# Patient Record
Sex: Female | Born: 1955 | Race: White | Hispanic: No | Marital: Married | Smoking: Never smoker
Health system: Southern US, Community
[De-identification: ages and names within clinical notes are randomized; demographics above are authoritative.]

## PROBLEM LIST (undated history)

## (undated) DIAGNOSIS — Z9861 Coronary angioplasty status: Secondary | ICD-10-CM

## (undated) DIAGNOSIS — I2510 Atherosclerotic heart disease of native coronary artery without angina pectoris: Secondary | ICD-10-CM

## (undated) DIAGNOSIS — N6009 Solitary cyst of unspecified breast: Secondary | ICD-10-CM

## (undated) DIAGNOSIS — J309 Allergic rhinitis, unspecified: Secondary | ICD-10-CM

## (undated) DIAGNOSIS — E785 Hyperlipidemia, unspecified: Secondary | ICD-10-CM

## (undated) DIAGNOSIS — J45909 Unspecified asthma, uncomplicated: Secondary | ICD-10-CM

## (undated) DIAGNOSIS — I447 Left bundle-branch block, unspecified: Secondary | ICD-10-CM

## (undated) HISTORY — PX: BREAST SURGERY: SHX581

## (undated) HISTORY — DX: Allergic rhinitis, unspecified: J30.9

## (undated) HISTORY — PX: BREAST BIOPSY: SHX20

## (undated) HISTORY — DX: Left bundle-branch block, unspecified: I44.7

## (undated) HISTORY — DX: Hyperlipidemia, unspecified: E78.5

## (undated) HISTORY — DX: Solitary cyst of unspecified breast: N60.09

## (undated) HISTORY — DX: Unspecified asthma, uncomplicated: J45.909

---

## 1988-05-06 HISTORY — PX: AUGMENTATION MAMMAPLASTY: SUR837

## 1999-06-25 ENCOUNTER — Other Ambulatory Visit: Admission: RE | Admit: 1999-06-25 | Discharge: 1999-06-25 | Payer: Self-pay | Admitting: Family Medicine

## 2002-07-22 ENCOUNTER — Encounter: Payer: Self-pay | Admitting: Family Medicine

## 2002-07-22 ENCOUNTER — Encounter: Admission: RE | Admit: 2002-07-22 | Discharge: 2002-07-22 | Payer: Self-pay | Admitting: Family Medicine

## 2002-09-24 ENCOUNTER — Other Ambulatory Visit: Admission: RE | Admit: 2002-09-24 | Discharge: 2002-09-24 | Payer: Self-pay | Admitting: Family Medicine

## 2004-03-09 ENCOUNTER — Other Ambulatory Visit: Admission: RE | Admit: 2004-03-09 | Discharge: 2004-03-09 | Payer: Self-pay | Admitting: Family Medicine

## 2004-03-09 ENCOUNTER — Ambulatory Visit: Payer: Self-pay | Admitting: Family Medicine

## 2005-04-15 ENCOUNTER — Encounter: Admission: RE | Admit: 2005-04-15 | Discharge: 2005-04-15 | Payer: Self-pay | Admitting: Family Medicine

## 2005-06-19 ENCOUNTER — Ambulatory Visit: Payer: Self-pay | Admitting: Internal Medicine

## 2005-06-21 ENCOUNTER — Encounter: Admission: RE | Admit: 2005-06-21 | Discharge: 2005-06-21 | Payer: Self-pay | Admitting: Internal Medicine

## 2006-01-29 ENCOUNTER — Ambulatory Visit: Payer: Self-pay | Admitting: Family Medicine

## 2006-03-13 ENCOUNTER — Ambulatory Visit: Payer: Self-pay | Admitting: Family Medicine

## 2006-03-13 LAB — CONVERTED CEMR LAB
Albumin: 4.1 g/dL (ref 3.5–5.2)
BUN: 9 mg/dL (ref 6–23)
Basophils Absolute: 0.1 10*3/uL (ref 0.0–0.1)
Calcium: 9.1 mg/dL (ref 8.4–10.5)
Chloride: 105 meq/L (ref 96–112)
Creatinine, Ser: 0.7 mg/dL (ref 0.4–1.2)
Glomerular Filtration Rate, Af Am: 114 mL/min/{1.73_m2}
Glucose, Bld: 92 mg/dL (ref 70–99)
MCV: 100.5 fL — ABNORMAL HIGH (ref 78.0–100.0)
Monocytes Absolute: 0.8 10*3/uL — ABNORMAL HIGH (ref 0.2–0.7)
Monocytes Relative: 13.3 % — ABNORMAL HIGH (ref 3.0–11.0)
Neutrophils Relative %: 43.3 % (ref 43.0–77.0)
RBC: 4.07 M/uL (ref 3.87–5.11)
RDW: 12.2 % (ref 11.5–14.6)
Sodium: 140 meq/L (ref 135–145)
TSH: 1.39 microintl units/mL (ref 0.35–5.50)
Total Bilirubin: 1.3 mg/dL — ABNORMAL HIGH (ref 0.3–1.2)
Total Protein: 7.3 g/dL (ref 6.0–8.3)
Triglyceride fasting, serum: 67 mg/dL (ref 0–149)

## 2006-03-26 ENCOUNTER — Ambulatory Visit: Payer: Self-pay | Admitting: Family Medicine

## 2006-03-26 ENCOUNTER — Other Ambulatory Visit: Admission: RE | Admit: 2006-03-26 | Discharge: 2006-03-26 | Payer: Self-pay | Admitting: Family Medicine

## 2006-03-26 ENCOUNTER — Encounter: Payer: Self-pay | Admitting: Family Medicine

## 2006-04-22 ENCOUNTER — Encounter: Admission: RE | Admit: 2006-04-22 | Discharge: 2006-04-22 | Payer: Self-pay | Admitting: Family Medicine

## 2006-10-21 ENCOUNTER — Ambulatory Visit: Payer: Self-pay | Admitting: Family Medicine

## 2006-11-11 ENCOUNTER — Ambulatory Visit: Payer: Self-pay | Admitting: Family Medicine

## 2007-03-02 DIAGNOSIS — J309 Allergic rhinitis, unspecified: Secondary | ICD-10-CM

## 2007-03-02 HISTORY — DX: Allergic rhinitis, unspecified: J30.9

## 2007-03-25 ENCOUNTER — Ambulatory Visit: Payer: Self-pay | Admitting: Family Medicine

## 2007-03-25 DIAGNOSIS — J018 Other acute sinusitis: Secondary | ICD-10-CM | POA: Insufficient documentation

## 2007-05-08 ENCOUNTER — Encounter: Admission: RE | Admit: 2007-05-08 | Discharge: 2007-05-08 | Payer: Self-pay | Admitting: Family Medicine

## 2007-10-19 ENCOUNTER — Ambulatory Visit: Payer: Self-pay | Admitting: Family Medicine

## 2007-10-19 LAB — CONVERTED CEMR LAB
ALT: 17 units/L (ref 0–35)
AST: 23 units/L (ref 0–37)
Albumin: 4.1 g/dL (ref 3.5–5.2)
BUN: 11 mg/dL (ref 6–23)
CO2: 27 meq/L (ref 19–32)
Calcium: 8.7 mg/dL (ref 8.4–10.5)
Chloride: 103 meq/L (ref 96–112)
Cholesterol: 191 mg/dL (ref 0–200)
Creatinine, Ser: 0.7 mg/dL (ref 0.4–1.2)
Eosinophils Absolute: 0.5 10*3/uL (ref 0.0–0.7)
Glucose, Bld: 87 mg/dL (ref 70–99)
HCT: 39.7 % (ref 36.0–46.0)
Hemoglobin: 13.9 g/dL (ref 12.0–15.0)
MCHC: 34.9 g/dL (ref 30.0–36.0)
MCV: 102.7 fL — ABNORMAL HIGH (ref 78.0–100.0)
Monocytes Absolute: 0.6 10*3/uL (ref 0.1–1.0)
Monocytes Relative: 11.7 % (ref 3.0–12.0)
Platelets: 290 10*3/uL (ref 150–400)
RDW: 12.4 % (ref 11.5–14.6)
TSH: 1.64 microintl units/mL (ref 0.35–5.50)
Total Protein: 7.2 g/dL (ref 6.0–8.3)
Triglycerides: 102 mg/dL (ref 0–149)
VLDL: 20 mg/dL (ref 0–40)
WBC: 5.1 10*3/uL (ref 4.5–10.5)

## 2007-10-26 ENCOUNTER — Encounter: Payer: Self-pay | Admitting: Family Medicine

## 2007-10-26 ENCOUNTER — Other Ambulatory Visit: Admission: RE | Admit: 2007-10-26 | Discharge: 2007-10-26 | Payer: Self-pay | Admitting: Family Medicine

## 2007-10-26 ENCOUNTER — Ambulatory Visit: Payer: Self-pay | Admitting: Family Medicine

## 2007-11-30 ENCOUNTER — Ambulatory Visit: Payer: Self-pay | Admitting: Gastroenterology

## 2007-12-14 ENCOUNTER — Ambulatory Visit: Payer: Self-pay | Admitting: Gastroenterology

## 2007-12-14 LAB — HM COLONOSCOPY

## 2007-12-23 ENCOUNTER — Telehealth: Payer: Self-pay | Admitting: Gastroenterology

## 2008-04-13 DIAGNOSIS — J069 Acute upper respiratory infection, unspecified: Secondary | ICD-10-CM | POA: Insufficient documentation

## 2008-04-20 ENCOUNTER — Ambulatory Visit: Payer: Self-pay | Admitting: Family Medicine

## 2008-05-11 ENCOUNTER — Telehealth: Payer: Self-pay | Admitting: Family Medicine

## 2008-07-01 ENCOUNTER — Ambulatory Visit: Payer: Self-pay | Admitting: Family Medicine

## 2008-07-01 DIAGNOSIS — J189 Pneumonia, unspecified organism: Secondary | ICD-10-CM | POA: Insufficient documentation

## 2008-09-29 ENCOUNTER — Ambulatory Visit: Payer: Self-pay | Admitting: Family Medicine

## 2008-09-29 DIAGNOSIS — R1110 Vomiting, unspecified: Secondary | ICD-10-CM | POA: Insufficient documentation

## 2008-10-21 ENCOUNTER — Ambulatory Visit: Payer: Self-pay | Admitting: Family Medicine

## 2008-10-21 DIAGNOSIS — J45909 Unspecified asthma, uncomplicated: Secondary | ICD-10-CM | POA: Insufficient documentation

## 2008-10-21 HISTORY — DX: Unspecified asthma, uncomplicated: J45.909

## 2008-11-15 ENCOUNTER — Ambulatory Visit: Payer: Self-pay | Admitting: Family Medicine

## 2008-11-15 LAB — CONVERTED CEMR LAB
Albumin: 3.9 g/dL (ref 3.5–5.2)
BUN: 11 mg/dL (ref 6–23)
Basophils Relative: 0.9 % (ref 0.0–3.0)
CO2: 29 meq/L (ref 19–32)
Cholesterol: 206 mg/dL — ABNORMAL HIGH (ref 0–200)
Creatinine, Ser: 0.7 mg/dL (ref 0.4–1.2)
GFR calc non Af Amer: 93.2 mL/min (ref >60)
Glucose, Urine, Semiquant: NEGATIVE
Hemoglobin: 13.8 g/dL (ref 12.0–15.0)
Ketones, urine, test strip: NEGATIVE
Lymphocytes Relative: 27.7 % (ref 12.0–46.0)
Monocytes Relative: 12.2 % — ABNORMAL HIGH (ref 3.0–12.0)
Neutro Abs: 3.1 10*3/uL (ref 1.4–7.7)
Nitrite: NEGATIVE
Potassium: 5 meq/L (ref 3.5–5.1)
RBC: 3.84 M/uL — ABNORMAL LOW (ref 3.87–5.11)
Specific Gravity, Urine: 1.02
Total CHOL/HDL Ratio: 2
Total Protein: 7.3 g/dL (ref 6.0–8.3)
VLDL: 14 mg/dL (ref 0.0–40.0)
WBC: 5.7 10*3/uL (ref 4.5–10.5)
pH: 7

## 2008-11-21 ENCOUNTER — Other Ambulatory Visit: Admission: RE | Admit: 2008-11-21 | Discharge: 2008-11-21 | Payer: Self-pay | Admitting: Family Medicine

## 2008-11-21 ENCOUNTER — Ambulatory Visit: Payer: Self-pay | Admitting: Family Medicine

## 2008-11-21 ENCOUNTER — Encounter: Payer: Self-pay | Admitting: Family Medicine

## 2009-02-03 ENCOUNTER — Encounter: Admission: RE | Admit: 2009-02-03 | Discharge: 2009-02-03 | Payer: Self-pay | Admitting: Family Medicine

## 2009-07-21 ENCOUNTER — Ambulatory Visit: Payer: Self-pay | Admitting: Family Medicine

## 2009-11-08 ENCOUNTER — Telehealth: Payer: Self-pay | Admitting: Family Medicine

## 2009-11-17 ENCOUNTER — Ambulatory Visit: Payer: Self-pay | Admitting: Family Medicine

## 2009-11-17 LAB — CONVERTED CEMR LAB
Alkaline Phosphatase: 39 units/L (ref 39–117)
BUN: 14 mg/dL (ref 6–23)
Basophils Relative: 1 % (ref 0.0–3.0)
Bilirubin Urine: NEGATIVE
Bilirubin, Direct: 0.2 mg/dL (ref 0.0–0.3)
CO2: 27 meq/L (ref 19–32)
Cholesterol: 195 mg/dL (ref 0–200)
Eosinophils Relative: 4 % (ref 0.0–5.0)
GFR calc non Af Amer: 108.82 mL/min (ref >60)
Glucose, Bld: 84 mg/dL (ref 70–99)
HCT: 38.7 % (ref 36.0–46.0)
Hemoglobin: 13.6 g/dL (ref 12.0–15.0)
MCV: 102.2 fL — ABNORMAL HIGH (ref 78.0–100.0)
Monocytes Absolute: 0.7 10*3/uL (ref 0.1–1.0)
Nitrite: NEGATIVE
Platelets: 298 10*3/uL (ref 150.0–400.0)
Protein, U semiquant: NEGATIVE
RDW: 13 % (ref 11.5–14.6)
Sodium: 142 meq/L (ref 135–145)
TSH: 0.77 microintl units/mL (ref 0.35–5.50)
Total Bilirubin: 1.1 mg/dL (ref 0.3–1.2)
Total CHOL/HDL Ratio: 2
Triglycerides: 44 mg/dL (ref 0.0–149.0)
WBC: 6.5 10*3/uL (ref 4.5–10.5)

## 2009-11-23 ENCOUNTER — Telehealth: Payer: Self-pay | Admitting: Family Medicine

## 2009-11-23 ENCOUNTER — Ambulatory Visit: Payer: Self-pay | Admitting: Family Medicine

## 2009-11-23 DIAGNOSIS — N6009 Solitary cyst of unspecified breast: Secondary | ICD-10-CM | POA: Insufficient documentation

## 2009-11-23 HISTORY — DX: Solitary cyst of unspecified breast: N60.09

## 2009-11-30 ENCOUNTER — Encounter: Admission: RE | Admit: 2009-11-30 | Discharge: 2009-11-30 | Payer: Self-pay | Admitting: Family Medicine

## 2009-12-12 ENCOUNTER — Other Ambulatory Visit: Admission: RE | Admit: 2009-12-12 | Discharge: 2009-12-12 | Payer: Self-pay | Admitting: Family Medicine

## 2009-12-12 ENCOUNTER — Ambulatory Visit: Payer: Self-pay | Admitting: Internal Medicine

## 2009-12-12 ENCOUNTER — Ambulatory Visit: Payer: Self-pay | Admitting: Family Medicine

## 2010-05-08 ENCOUNTER — Encounter
Admission: RE | Admit: 2010-05-08 | Discharge: 2010-05-08 | Payer: Self-pay | Source: Home / Self Care | Attending: Family Medicine | Admitting: Family Medicine

## 2010-05-08 LAB — HM MAMMOGRAPHY

## 2010-05-28 ENCOUNTER — Encounter: Payer: Self-pay | Admitting: Orthopedic Surgery

## 2010-06-07 NOTE — Assessment & Plan Note (Signed)
Summary: CPX---WITH PAP//CCM   Vital Signs:  Patient profile:   55 year old female Menstrual status:  regular LMP:     11/21/2009 Height:      62 inches Weight:      138 pounds Temp:     97.5 degrees F oral BP sitting:   122 / 90  (left arm) Cuff size:   regular  Vitals Entered By: Kathrynn Speed CMA (November 23, 2009 2:03 PM) CC: cpx, w labs, NO PAP done due to [facility], src LMP (date): 11/21/2009     Enter LMP: 11/21/2009 Last PAP Result NEGATIVE FOR INTRAEPITHELIAL LESIONS OR MALIGNANCY.    CC:  cpx, w labs, NO PAP done due to menstration, and src.  History of Present Illness: Angelica [name] is a 55 year old, married female, nonsmoker, who comes in today for annual physical exam.  She has a history of underlying allergic rhinitis and occasional asthma.  She took Qvar in the spring for flare of asthma, currently asymptomatic.  She has routine eye care., no dental care, BSE monthly, mammogram, due, and limp.  He now will defer Pap tetanus 2007 seasonal flu 2010  Current Medications (verified): 1)  Adult Aspirin Low Strength 81 Mg  Tbdp (Aspirin) .Marland Kitchen.. 1 By Mouth Once Daily  Allergies (verified): 1)  ! Prednisone  Past History:  Past medical, surgical, family and social histories (including risk factors) reviewed, and no changes noted (except as noted below).  Past Medical History: Reviewed history from 10/26/2007 and no changes required. Allergic rhinitis bilateral breast implants  Past Surgical History: Reviewed history from 03/02/2007 and no changes required. Breast Bx Breast Implants  Family History: Reviewed history from 03/02/2007 and no changes required. Family History of Alcoholism/Addiction Family History Diabetes 1st degree relative Family History Hypertension Family History of Cardiovascular disorder  Social History: Reviewed history from 10/26/2007 and no changes required. Occupation:  Never Smoked Alcohol use-no Married Drug use-no Regular  exercise-yes  Review of Systems      See HPI  Physical Exam  General:  Well-developed,well-nourished,in no acute distress; alert,appropriate and cooperative throughout examination Head:  Normocephalic and atraumatic without obvious abnormalities. No apparent alopecia or balding. Eyes:  No corneal or conjunctival inflammation noted. EOMI. Perrla. Funduscopic exam benign, without hemorrhages, exudates or papilledema. Vision grossly normal. Ears:  External ear exam shows no significant lesions or deformities.  Otoscopic examination reveals clear canals, tympanic membranes are intact bilaterally without bulging, retraction, inflammation or discharge. Hearing is grossly normal bilaterally. Nose:  External nasal examination shows no deformity or inflammation. Nasal mucosa are pink and moist without lesions or exudates. Mouth:  Oral mucosa and oropharynx without lesions or exudates.  Teeth in good repair. Neck:  No deformities, masses, or tenderness noted. Chest Wall:  No deformities, masses, or tenderness noted. Breasts:  bilateral implants, question of a cystic lesion left breast 12 o'clock just below the nipple or is this part of the implant.  She will schedule her mammogram for next week Lungs:  Normal respiratory effort, chest expands symmetrically. Lungs are clear to auscultation, no crackles or wheezes. Heart:  Normal rate and regular rhythm. S1 and S2 normal without gallop, murmur, click, rub or other extra sounds. Abdomen:  Bowel sounds positive,abdomen soft and non-tender without masses, organomegaly or hernias noted. Rectal:  No external abnormalities noted. Normal sphincter tone. No rectal masses or tenderness. Genitalia:  Pelvic Exam:        External: normal female genitalia without lesions or masses  Vagina: normal without lesions or masses        Cervix: normal without lesions or masses        Adnexa: normal bimanual exam without masses or fullness        Uterus: normal by  palpation        Pap smear: performed Msk:  No deformity or scoliosis noted of thoracic or lumbar spine.   Pulses:  R and L carotid,radial,femoral,dorsalis pedis and posterior tibial pulses are full and equal bilaterally Extremities:  No clubbing, cyanosis, edema, or deformity noted with normal full range of motion of all joints.   Neurologic:  No cranial nerve deficits noted. Station and gait are normal. Plantar reflexes are down-going bilaterally. DTRs are symmetrical throughout. Sensory, motor and coordinative functions appear intact. Skin:  Intact without suspicious lesions or rashes Cervical Nodes:  No lymphadenopathy noted Axillary Nodes:  No palpable lymphadenopathy Inguinal Nodes:  No significant adenopathy Psych:  Cognition and judgment appear intact. Alert and cooperative with normal attention span and concentration. No apparent delusions, illusions, hallucinations   Impression & Recommendations:  Problem # 1:  ROUTINE GENERAL MEDICAL EXAM@HEALTH  CARE FACL (ICD-V70.0) Assessment Unchanged  Orders: Prescription Created Electronically [phone])  Complete Medication List: 1)  Adult Aspirin Low Strength 81 Mg Tbdp (Aspirin) .Marland Kitchen.. 1 by mouth once daily  Other Orders: T-Bone Densitometry [phone])  Patient Instructions: 1)  return in two weeks for your Pap smear 2)  It is important that you exercise regularly at least 20 minutes 5 times a week. If you develop chest pain, have severe difficulty breathing, or feel very tired , stop exercising immediately and seek medical attention. 3)  Schedule your mammogram. 4)  Schedule a colonoscopy/sigmoidoscopy to help detect colon cancer. 5)  Take calcium +Vitamin D daily. 6)  Take an Aspirin every day.

## 2010-06-07 NOTE — Progress Notes (Signed)
Summary: CA-125  Phone Note Call from Patient Call back at Home Phone [phone]   Caller: Patient Call For: Roderick Pee MD Summary of Call: Pt is sch for cpx labs on 11-16-2009 pt would like to add CA-125 lab test to order. Can I sch? Initial call taken by: Heron Sabins,  November 08, 2009 12:03 PM  Follow-up for Phone Call        discussed with Kristin the U981 is not a screening test Follow-up by: Roderick Pee MD,  November 08, 2009 12:12 PM

## 2010-06-07 NOTE — Assessment & Plan Note (Signed)
Summary: SINUSITIS, FEVER // RS   Vital Signs:  Patient profile:   55 year old female Menstrual status:  regular Weight:      138 pounds BMI:     25.33 Temp:     98.8 degrees F oral BP sitting:   120 / 80  (left arm) Cuff size:   regular  Vitals Entered By: Kern Reap CMA Duncan Dull) (July 21, 2009 3:33 PM)  Reason for Visit cough,congestion, fever  History of Present Illness: Angelica [name] is a 55 year old female, who comes in today for evaluation of a fever, chills, sore throat, nonproductive cough x 4 days.  She said these flulike symptoms for the past 4 days.  Today, the fever it is decreasing.  She did not get her flu shot last fall.  No nausea, vomiting, or diarrhea.  No wheezing.  Allergies: 1)  ! Prednisone  Past History:  Past medical, surgical, family and social histories (including risk factors) reviewed for relevance to current acute and chronic problems.  Past Medical History: Reviewed history from 10/26/2007 and no changes required. Allergic rhinitis bilateral breast implants  Past Surgical History: Reviewed history from 03/02/2007 and no changes required. Breast Bx Breast Implants  Family History: Reviewed history from 03/02/2007 and no changes required. Family History of Alcoholism/Addiction Family History Diabetes 1st degree relative Family History Hypertension Family History of Cardiovascular disorder  Social History: Reviewed history from 10/26/2007 and no changes required. Occupation:  Never Smoked Alcohol use-no Married Drug use-no Regular exercise-yes  Review of Systems      See HPI  Physical Exam  General:  Well-developed,well-nourished,in no acute distress; alert,appropriate and cooperative throughout examination Head:  Normocephalic and atraumatic without obvious abnormalities. No apparent alopecia or balding. Eyes:  No corneal or conjunctival inflammation noted. EOMI. Perrla. Funduscopic exam benign, without hemorrhages, exudates or  papilledema. Vision grossly normal. Ears:  External ear exam shows no significant lesions or deformities.  Otoscopic examination reveals clear canals, tympanic membranes are intact bilaterally without bulging, retraction, inflammation or discharge. Hearing is grossly normal bilaterally. Nose:  External nasal examination shows no deformity or inflammation. Nasal mucosa are pink and moist without lesions or exudates. Mouth:  Oral mucosa and oropharynx without lesions or exudates.  Teeth in good repair. Neck:  No deformities, masses, or tenderness noted. Chest Wall:  No deformities, masses, or tenderness noted. Lungs:  Normal respiratory effort, chest expands symmetrically. Lungs are clear to auscultation, no crackles or wheezes.   Impression & Recommendations:  Problem # 1:  VIRAL URI (ICD-465.9) Assessment Deteriorated  Her updated medication list for this problem includes:    Adult Aspirin Low Strength 81 Mg Tbdp (Aspirin) .Marland Kitchen... 1 by mouth once daily  Complete Medication List: 1)  Adult Aspirin Low Strength 81 Mg Tbdp (Aspirin) .Marland Kitchen.. 1 by mouth once daily 2)  Qvar 40 Mcg/act Aers (Beclomethasone dipropionate) .... 2 ps two times a day 3)  Biaxin 500 Mg Tabs (Clarithromycin) .... Take 1 tablet by mouth two times a day  Patient Instructions: 1)  drink 30 ounces of water daily, run a  vaporizer in her bedroom at night,  Hydromet one half to 1 teaspoon 3 times a day as needed for cough. 2)  I would restart the Qvar two puffs b.i.d. and I'll give you a prescription for Biaxin 500 mg b.i.d. to take as needed 3)  returned the second week in July for your annual exam Prescriptions: QVAR 40 MCG/ACT AERS (BECLOMETHASONE DIPROPIONATE) 2 ps two times a day  #2 x  4   Entered and Authorized by:   Roderick Pee MD   Signed by:   Roderick Pee MD on 07/21/2009   Method used:   Print then Give to Patient   RxID:   [id number] BIAXIN 500 MG TABS (CLARITHROMYCIN) Take 1 tablet by mouth two times a  day  #20 x 0   Entered and Authorized by:   Roderick Pee MD   Signed by:   Roderick Pee MD on 07/21/2009   Method used:   Print then Give to Patient   RxID:   [id number]

## 2010-06-07 NOTE — Progress Notes (Signed)
Summary: written order  Phone Note Call from Patient Call back at Home Phone [phone]   Caller: Patient Call For: Roderick Pee MD Summary of Call: Pt needs order for diagnostic mammogram left  breast ?mass or cyst fax to [facility] [phone] Initial call taken by: Heron Sabins,  November 23, 2009 4:07 PM  Follow-up for Phone Call        ok Follow-up by: Roderick Pee MD,  November 23, 2009 4:18 PM  Additional Follow-up for Phone Call Additional follow up Details #1::        order was faxed on 7-25-*2011 Additional Follow-up by: Heron Sabins,  November 27, 2009 9:33 AM  New Problems: BREAST CYST, LEFT (ICD-610.0)   New Problems: BREAST CYST, LEFT (ICD-610.0)

## 2010-06-07 NOTE — Miscellaneous (Signed)
Summary: BONE DENSITY  Clinical Lists Changes  Orders: Added new Test order of T-Bone Densitometry (77080) - Signed Added new Test order of T-Lumbar Vertebral Assessment (77082) - Signed 

## 2010-06-07 NOTE — Assessment & Plan Note (Signed)
Summary: pap no charge per pt/original cpx 11-23-2009/njr----PT [facility] // RS   History of Present Illness: Angelica [name] is a 55 year old female, who comes in today for a Pap smear.  We did her physical examination a week ago, but were unable to her Pap.  Tissue is having her menstrual cycle at that time.  Allergies: 1)  ! Prednisone  Physical Exam  General:  Well-developed,well-nourished,in no acute distress; alert,appropriate and cooperative throughout examination Rectal:  No external abnormalities noted. Normal sphincter tone. No rectal masses or tenderness. Genitalia:  Pelvic Exam:        External: normal female genitalia without lesions or masses        Vagina: normal without lesions or masses        Cervix: normal without lesions or masses        Adnexa: normal bimanual exam without masses or fullness        Uterus: normal by palpation        Pap smear: performed   Impression & Recommendations:  Problem # 1:  ROUTINE GENERAL MEDICAL EXAM@HEALTH  CARE FACL (ICD-V70.0) Assessment Comment Only  Orders: No Charge Patient Arrived (NCPA0) (NCPA0)  Complete Medication List: 1)  Adult Aspirin Low Strength 81 Mg Tbdp (Aspirin) .Marland Kitchen.. 1 by mouth once daily

## 2010-10-16 ENCOUNTER — Ambulatory Visit (INDEPENDENT_AMBULATORY_CARE_PROVIDER_SITE_OTHER): Payer: Self-pay | Admitting: Internal Medicine

## 2010-10-16 ENCOUNTER — Encounter: Payer: Self-pay | Admitting: Internal Medicine

## 2010-10-16 VITALS — BP 120/88 | Ht 64.0 in | Wt 142.0 lb

## 2010-10-16 DIAGNOSIS — M778 Other enthesopathies, not elsewhere classified: Secondary | ICD-10-CM

## 2010-10-16 NOTE — Patient Instructions (Signed)
Use Aleve 2 tablets twice daily as needed Consider the use of a right wrist brace  Try to avoid overuse activities  Call or return to clinic prn if these symptoms worsen or fail to improve as anticipated.

## 2010-10-16 NOTE — Progress Notes (Signed)
  Subjective:    Patient ID: Angelica [name], female    DOB: [birth year], 55 y.o.   MRN: [id number]  HPI  55 year old patient who presents with a long several month history of right wrist pain this flares from time to time. She spends a tremendous amount of time  at her laptop and she states she often responds to over 200 e mails  Daily.  She has difficult time holding objects such as a coffee cup against gravity due to the discomfort. No paresthesias. Pain often radiates up the right arm to the elbow    Review of Systems  Musculoskeletal: Positive for arthralgias.       Objective:   Physical Exam  Musculoskeletal:       No focal tenderness noted about the wrist. No muscle atrophy noted of the hand Tinel's negative Right grip strength was felt to be normal although this did aggravate the discomfort Reflexes were normal bilaterally          Assessment & Plan:   Overuse tendinitis right wrist. Situation discussed at length she'll attempt to avoid repetitive overuse type activities and try to delegate more. Will use Aleve as needed. She'll consider a wrist brace

## 2010-12-31 ENCOUNTER — Ambulatory Visit (INDEPENDENT_AMBULATORY_CARE_PROVIDER_SITE_OTHER): Payer: 59 | Admitting: Family Medicine

## 2010-12-31 ENCOUNTER — Telehealth: Payer: Self-pay | Admitting: Family Medicine

## 2010-12-31 ENCOUNTER — Other Ambulatory Visit: Payer: Self-pay | Admitting: Family Medicine

## 2010-12-31 ENCOUNTER — Ambulatory Visit (INDEPENDENT_AMBULATORY_CARE_PROVIDER_SITE_OTHER)
Admission: RE | Admit: 2010-12-31 | Discharge: 2010-12-31 | Disposition: A | Discharge status: Home / Self Care | Payer: 59 | Source: Ambulatory Visit | Attending: Family Medicine | Admitting: Family Medicine

## 2010-12-31 ENCOUNTER — Encounter: Payer: Self-pay | Admitting: Family Medicine

## 2010-12-31 VITALS — BP 110/80 | Temp 98.6°F | Wt 142.0 lb

## 2010-12-31 DIAGNOSIS — M549 Dorsalgia, unspecified: Secondary | ICD-10-CM

## 2010-12-31 MED ORDER — HYDROCODONE-ACETAMINOPHEN 7.5-750 MG PO TABS: 1.0000 | ORAL_TABLET | Freq: Four times a day (QID) | ORAL | Status: DC | PRN

## 2010-12-31 NOTE — Telephone Encounter (Signed)
Pt has appt scheduled for 12:00 but would like to come sooner if possible. She is having pain under her shoulder blade for a few days now and the pain is going down her arm when she moves it. Please contact pt.

## 2010-12-31 NOTE — Patient Instructions (Signed)
Go to the main office now for your chest x-ray.  Vicodin one half to one tablet p.r.n. For pain.  Return if you develop other symptoms or the pain does not go away after a couple weeks

## 2010-12-31 NOTE — Telephone Encounter (Signed)
Spoke with patient.

## 2010-12-31 NOTE — Progress Notes (Signed)
  Subjective:    Patient ID: Angelica [name], female    DOB: Mar 31, 1956, 55 y.o.   MRN: [id number]  HPI   Kobe is a 55 year old, married female, nonsmoker, who comes in today accompanied by her husband for evaluation of atypical back pain.  She states that 3 weeks ago she began having episodes of back pain.  She points to the right subscapular area of her back as a source of her pain.  She describes it as dull, a 9 on a scale of one to 10, radiating around to her arm and chest and jaw.  It lasts for a few seconds or a few minutes and then go away.  It is not associate with exertion is not associated with eating, and she otherwise feels well.  These episodes now occur about every 3 hours.  Cardiopulmonary is systems otherwise negative    Review of Systems    General cardiac, pulmonary, is systems GI review of systems otherwise negative Objective:   Physical Exam [place] punishing in acute distress.  Examination of the chest is normal.  No palpable tenderness.  Lungs are clear to auscultation.  Cardiac exam negative       Assessment & Plan:  Chest wall pain.  Plan check chest x-ray, Vicodin one half to one tab q.4 h. P.r.n. Return p.r.n.

## 2011-01-02 ENCOUNTER — Emergency Department (HOSPITAL_COMMUNITY): Payer: 59

## 2011-01-02 ENCOUNTER — Emergency Department (HOSPITAL_COMMUNITY)
Admission: EM | Admit: 2011-01-02 | Discharge: 2011-01-03 | Disposition: A | Discharge status: Home / Self Care | Payer: 59 | Attending: Emergency Medicine | Admitting: Emergency Medicine

## 2011-01-02 ENCOUNTER — Telehealth: Payer: Self-pay | Admitting: Family Medicine

## 2011-01-02 DIAGNOSIS — R0602 Shortness of breath: Secondary | ICD-10-CM | POA: Insufficient documentation

## 2011-01-02 DIAGNOSIS — R61 Generalized hyperhidrosis: Secondary | ICD-10-CM | POA: Insufficient documentation

## 2011-01-02 DIAGNOSIS — R0609 Other forms of dyspnea: Secondary | ICD-10-CM | POA: Insufficient documentation

## 2011-01-02 DIAGNOSIS — R6884 Jaw pain: Secondary | ICD-10-CM | POA: Insufficient documentation

## 2011-01-02 DIAGNOSIS — M79609 Pain in unspecified limb: Secondary | ICD-10-CM | POA: Insufficient documentation

## 2011-01-02 DIAGNOSIS — F411 Generalized anxiety disorder: Secondary | ICD-10-CM | POA: Insufficient documentation

## 2011-01-02 DIAGNOSIS — M25519 Pain in unspecified shoulder: Secondary | ICD-10-CM | POA: Insufficient documentation

## 2011-01-02 DIAGNOSIS — R0989 Other specified symptoms and signs involving the circulatory and respiratory systems: Secondary | ICD-10-CM | POA: Insufficient documentation

## 2011-01-02 DIAGNOSIS — M546 Pain in thoracic spine: Secondary | ICD-10-CM | POA: Insufficient documentation

## 2011-01-02 DIAGNOSIS — R079 Chest pain, unspecified: Secondary | ICD-10-CM | POA: Insufficient documentation

## 2011-01-02 DIAGNOSIS — R110 Nausea: Secondary | ICD-10-CM | POA: Insufficient documentation

## 2011-01-02 LAB — DIFFERENTIAL
Basophils Absolute: 0 10*3/uL (ref 0.0–0.1)
Basophils Relative: 0 % (ref 0–1)
Eosinophils Relative: 19 % — ABNORMAL HIGH (ref 0–5)
Lymphocytes Relative: 28 % (ref 12–46)
Lymphs Abs: 3.2 10*3/uL (ref 0.7–4.0)
Monocytes Absolute: 0.9 10*3/uL (ref 0.1–1.0)

## 2011-01-02 LAB — COMPREHENSIVE METABOLIC PANEL
ALT: 27 U/L (ref 0–35)
AST: 22 U/L (ref 0–37)
Albumin: 4.3 g/dL (ref 3.5–5.2)
Alkaline Phosphatase: 57 U/L (ref 39–117)
BUN: 11 mg/dL (ref 6–23)
Calcium: 9.4 mg/dL (ref 8.4–10.5)
Creatinine, Ser: 0.66 mg/dL (ref 0.50–1.10)
GFR calc non Af Amer: >60 mL/min (ref >60)
Potassium: 3.6 mEq/L (ref 3.5–5.1)
Total Bilirubin: 0.5 mg/dL (ref 0.3–1.2)

## 2011-01-02 LAB — URINALYSIS, ROUTINE W REFLEX MICROSCOPIC
Bilirubin Urine: NEGATIVE
Glucose, UA: NEGATIVE mg/dL
Ketones, ur: 15 mg/dL — AB
Protein, ur: NEGATIVE mg/dL
pH: 6 (ref 5.0–8.0)

## 2011-01-02 LAB — CBC
Hemoglobin: 14.7 g/dL (ref 12.0–15.0)
MCH: 34.4 pg — ABNORMAL HIGH (ref 26.0–34.0)
MCHC: 35.3 g/dL (ref 30.0–36.0)
MCV: 97.7 fL (ref 78.0–100.0)
Platelets: 322 10*3/uL (ref 150–400)

## 2011-01-02 LAB — URINE MICROSCOPIC-ADD ON

## 2011-01-02 LAB — POCT I-STAT TROPONIN I: Troponin i, poc: 0.03 ng/mL (ref 0.00–0.08)

## 2011-01-02 NOTE — Telephone Encounter (Signed)
Pt called and is req to get results from chest xray. Pt is still having same problem. Pls call asap.

## 2011-01-05 DIAGNOSIS — I2510 Atherosclerotic heart disease of native coronary artery without angina pectoris: Secondary | ICD-10-CM

## 2011-01-05 HISTORY — PX: PERCUTANEOUS CORONARY STENT INTERVENTION (PCI-S): SHX6016

## 2011-01-05 HISTORY — DX: Atherosclerotic heart disease of native coronary artery without angina pectoris: I25.10

## 2011-01-09 ENCOUNTER — Ambulatory Visit (INDEPENDENT_AMBULATORY_CARE_PROVIDER_SITE_OTHER): Payer: 59 | Admitting: Cardiology

## 2011-01-09 ENCOUNTER — Encounter: Payer: Self-pay | Admitting: Cardiology

## 2011-01-09 DIAGNOSIS — M549 Dorsalgia, unspecified: Secondary | ICD-10-CM

## 2011-01-09 DIAGNOSIS — R079 Chest pain, unspecified: Secondary | ICD-10-CM

## 2011-01-09 NOTE — Progress Notes (Signed)
HPI: 55 year old female for evaluation of chest pain. Note troponin, d-dimer, hemoglobin and liver functions were normal on January 01, 2011. The patient has had intermittent pain for approximately one month. These episodes began in her mid back. They are described as a sharp pain followed by a pressure. It radiates to the chest and occasionally to the face. There is a question of whether it is related to food or using her right upper extremity. It is not exertional. It lasts 2-5 minutes and resolves spontaneously. Occasionally there is residual soreness. There is mild nausea and diaphoresis but no shortness of breath. There is no syncope. The episodes are becoming more frequent. She has been to urgent care, [facility] emergency room and [facility] emergency room. She  had a CT scan in [place] that showed no pulmonary embolus or dissection. Because of the above, we were asked to evaluate.   Current Outpatient Prescriptions  Medication Sig Dispense Refill  . HYDROcodone-acetaminophen (VICODIN ES) 7.5-750 MG per tablet Take 1 tablet by mouth every 6 (six) hours as needed for pain.  30 tablet  1  . NAPROXEN PO 1 tab po bid         Allergies  Allergen Reactions  . Prednisone     Past Medical History  Diagnosis Date  . ALLERGIC RHINITIS 03/02/2007  . BREAST CYST, LEFT 11/23/2009  . 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 Breast surgery     BILAT IMPLANTS  . Breast biopsy     History   Social History  . Marital Status: Married    Spouse Name: N/A    Number of Children: N/A  . 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 Smoking status: Never Smoker   . Smokeless tobacco: Never Used  . Alcohol Use: Yes     Occasional  . Drug Use: No  . Sexually Active: Not on file   Other Topics Concern  . Not on file   Social History Narrative   Occupation: Never SmokedAlcohol use-  noMarriedDrug use- noRegular Exercise- yes     Family History  Problem Relation Age of Onset  . Alcohol abuse Other   . Diabetes Other   . Hypertension Other   . Heart disease Other     ROS: no fevers or chills, productive cough, hemoptysis, dysphasia, odynophagia, melena, hematochezia, dysuria, hematuria, rash, seizure activity, orthopnea, PND, pedal edema, claudication. Remaining systems are negative.  Physical Exam: General:  Well developed/well nourished in NAD Skin warm/dry Patient not depressed No peripheral clubbing Back-normal HEENT-normal/normal eyelids Neck supple/normal carotid upstroke bilaterally; no bruits; no JVD; no thyromegaly chest - CTA/ normal expansion CV - RRR/normal S1 and S2; no murmurs, rubs or gallops;  PMI nondisplaced Abdomen -NT/ND, no HSM, no mass, + bowel sounds, no bruit 2+ femoral pulses, no bruits Ext-no edema, chords, 2+ DP Neuro-grossly nonfocal  ECG 01/01/11 NSR, nonspecific ST changes ECG today NSR witb no ST changes.

## 2011-01-09 NOTE — Assessment & Plan Note (Addendum)
Etiology of symptoms unclear to me. Patient had significant pain but it is not exertional and lasts 2-5 minutes with spontaneous resolution. CT Scan in [place] showed no pulmonary embolus or dissection. Liver functions have been normal. No right upper quadrant tenderness on examination. This would be an extremely unusual presentation for ischemia. She has no cardiac risk factors. She has had negative cardiac markers in [place] and [place]. She did have minor ST changes from previous electrocardiogram. ECG today is negative. I will schedule a stress echo for risk stratification. If negative it may be worthwhile to pursue a MRI of Spine to R/O disk disease. We could also consider a GI evaluation. Over 30 minutes in discussion with patient and reviewing outside records.

## 2011-01-09 NOTE — Patient Instructions (Signed)
Your physician recommends that you schedule a follow-up appointment in: 6 weeks  Your physician has requested that you have a stress echocardiogram. For further information please visit [email or web address] Please follow instruction sheet as given.

## 2011-01-09 NOTE — Assessment & Plan Note (Signed)
She will followup with Dr. Tawanna Cooler as well. Maybe musculoskeletal component.

## 2011-01-15 ENCOUNTER — Other Ambulatory Visit (HOSPITAL_COMMUNITY): Payer: Self-pay | Admitting: Cardiology

## 2011-01-15 ENCOUNTER — Inpatient Hospital Stay (HOSPITAL_COMMUNITY)
Admission: AD | Admit: 2011-01-15 | Discharge: 2011-01-17 | DRG: 247 | Disposition: A | Discharge status: Home / Self Care | Payer: 59 | Source: Ambulatory Visit | Attending: Cardiology | Admitting: Cardiology

## 2011-01-15 ENCOUNTER — Encounter: Payer: Self-pay | Admitting: Cardiology

## 2011-01-15 ENCOUNTER — Ambulatory Visit (HOSPITAL_BASED_OUTPATIENT_CLINIC_OR_DEPARTMENT_OTHER): Payer: 59

## 2011-01-15 ENCOUNTER — Ambulatory Visit (HOSPITAL_BASED_OUTPATIENT_CLINIC_OR_DEPARTMENT_OTHER): Payer: 59 | Admitting: Radiology

## 2011-01-15 DIAGNOSIS — R072 Precordial pain: Secondary | ICD-10-CM

## 2011-01-15 DIAGNOSIS — I2510 Atherosclerotic heart disease of native coronary artery without angina pectoris: Principal | ICD-10-CM | POA: Diagnosis present

## 2011-01-15 DIAGNOSIS — E785 Hyperlipidemia, unspecified: Secondary | ICD-10-CM | POA: Diagnosis present

## 2011-01-15 DIAGNOSIS — I447 Left bundle-branch block, unspecified: Secondary | ICD-10-CM | POA: Diagnosis present

## 2011-01-15 DIAGNOSIS — Z7982 Long term (current) use of aspirin: Secondary | ICD-10-CM

## 2011-01-15 DIAGNOSIS — R079 Chest pain, unspecified: Secondary | ICD-10-CM

## 2011-01-15 DIAGNOSIS — Z23 Encounter for immunization: Secondary | ICD-10-CM

## 2011-01-15 DIAGNOSIS — J309 Allergic rhinitis, unspecified: Secondary | ICD-10-CM | POA: Diagnosis present

## 2011-01-15 DIAGNOSIS — N6019 Diffuse cystic mastopathy of unspecified breast: Secondary | ICD-10-CM | POA: Diagnosis present

## 2011-01-15 DIAGNOSIS — R0989 Other specified symptoms and signs involving the circulatory and respiratory systems: Secondary | ICD-10-CM

## 2011-01-15 DIAGNOSIS — I4729 Other ventricular tachycardia: Secondary | ICD-10-CM | POA: Diagnosis present

## 2011-01-15 DIAGNOSIS — I200 Unstable angina: Secondary | ICD-10-CM | POA: Diagnosis present

## 2011-01-15 DIAGNOSIS — J45909 Unspecified asthma, uncomplicated: Secondary | ICD-10-CM | POA: Diagnosis present

## 2011-01-15 DIAGNOSIS — I472 Ventricular tachycardia, unspecified: Secondary | ICD-10-CM | POA: Diagnosis present

## 2011-01-15 LAB — BASIC METABOLIC PANEL
BUN: 9 mg/dL (ref 6–23)
Chloride: 103 mEq/L (ref 96–112)
Glucose, Bld: 91 mg/dL (ref 70–99)
Potassium: 3.9 mEq/L (ref 3.5–5.1)

## 2011-01-15 LAB — CBC
Hemoglobin: 13.8 g/dL (ref 12.0–15.0)
MCH: 33.5 pg (ref 26.0–34.0)
MCV: 97.1 fL (ref 78.0–100.0)
Platelets: 394 10*3/uL (ref 150–400)
RBC: 4.12 MIL/uL (ref 3.87–5.11)

## 2011-01-15 MED ORDER — PRASUGREL HCL 10 MG PO TABS: 60.0000 mg | ORAL_TABLET | Freq: Once | ORAL | Status: DC

## 2011-01-15 MED ORDER — PRASUGREL HCL 10 MG PO TABS
60.0000 mg | ORAL_TABLET | Freq: Once | ORAL | Status: AC
  Administered 2011-01-15: 60 mg via ORAL

## 2011-01-15 MED ORDER — PERFLUTREN PROTEIN A MICROSPH IV SUSP
2.5000 mL | Freq: Once | INTRAVENOUS | Status: AC
  Administered 2011-01-15: 2.5 mL via INTRAVENOUS

## 2011-01-16 ENCOUNTER — Other Ambulatory Visit: Payer: Self-pay

## 2011-01-16 DIAGNOSIS — I2510 Atherosclerotic heart disease of native coronary artery without angina pectoris: Secondary | ICD-10-CM

## 2011-01-16 DIAGNOSIS — E7800 Pure hypercholesterolemia, unspecified: Secondary | ICD-10-CM

## 2011-01-16 LAB — CBC
HCT: 35.6 % — ABNORMAL LOW (ref 36.0–46.0)
Hemoglobin: 12.5 g/dL (ref 12.0–15.0)
MCH: 33.9 pg (ref 26.0–34.0)
MCHC: 35.1 g/dL (ref 30.0–36.0)
MCV: 96.5 fL (ref 78.0–100.0)

## 2011-01-17 DIAGNOSIS — I200 Unstable angina: Secondary | ICD-10-CM

## 2011-01-17 LAB — CBC
Hemoglobin: 13.6 g/dL (ref 12.0–15.0)
MCH: 33.3 pg (ref 26.0–34.0)
MCV: 97.1 fL (ref 78.0–100.0)
RBC: 4.09 MIL/uL (ref 3.87–5.11)

## 2011-01-17 LAB — BASIC METABOLIC PANEL
CO2: 22 mEq/L (ref 19–32)
Calcium: 9.7 mg/dL (ref 8.4–10.5)
Chloride: 104 mEq/L (ref 96–112)
GFR calc Af Amer: >60 mL/min (ref >60)
Sodium: 137 mEq/L (ref 135–145)

## 2011-01-17 LAB — CARDIAC PANEL(CRET KIN+CKTOT+MB+TROPI): Relative Index: INVALID (ref 0.0–2.5)

## 2011-01-18 ENCOUNTER — Telehealth: Payer: Self-pay | Admitting: Cardiology

## 2011-01-18 NOTE — Consult Note (Signed)
NAME:  Angelica [name], Angelica [name]            ACCOUNT NO.:  [id number]  MEDICAL RECORD NO.:  [id number]  LOCATION:                                 FACILITY:  PHYSICIAN:  Noralyn Pick. Eden Emms, MD, FACCDATE OF BIRTH:  Jul 31, 1955  DATE OF CONSULTATION: DATE OF DISCHARGE:                                Admission   This is a 55 year old patient of Dr. Jens Som, last seen in the office on January 09, 2011.  She has been evaluated multiple times over the left 3 weeks for chest pain including 2 ER visits.  The patient was scheduled to have a stress echo in the office.  I was called to see her after her stress echo.  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  Her clinical syndrome with chest pain which included back pain radiating down the right arm and pain radiating up through the chest, was reproduced.  Echo images showed marked LV cavity dilatation with septal apical anterior wall hypokinesis.  At the end of the test, she was pain free without diaphoresis or palpitations and no shortness of breath.  Given the markedly positive stress test, I talked to her and her husband regarding hospitalization for catheterization in the morning.  We will load her with 60 of Plavix and start her on heparin.  The patient has been having symptoms for 3 weeks.  She is currently totally asymptomatic and her ECG has returned to normal.  I talked to her about allowing her husband to take her over to the hospital.  We already have a bed for her on 3700 and she seemed fine with this.  The patient's 10-point review of systems is otherwise negative.  Her medications as an outpatient included hydrocodone and Naprosyn.  She is allergic to PREDNISONE.  PAST MEDICAL HISTORY: 1. Allergic rhinitis. 2. Left-sided cystic breast disease. 3. Asthma.  She has previously had bilateral breast implants.  The patient is married.  I spoke with her husband today.  She is a nonsmoker, nondrinker.   She walks on a regular basis.  Family Hisotry: Unremarkable with no premature CAD  PHYSICAL EXAMINATION:  GENERAL:  Remarkable for a middle-aged white female in no distress. VITAL SIGNS:  Blood pressure is 130/80, pulse 70 and regular, respiratory rate 14, and afebrile. HEENT:  Unremarkable. NECK:  Carotids are normal without bruit.  No lymphadenopathy, thyromegaly, or JVP elevation. LUNGS:  Clear with diaphragmatic motion.  No wheezing. HEART:  S1 and S2.  Normal heart sounds.  PMI normal. ABDOMEN:  Benign.  Bowel sounds positive.  No AAA.  No tenderness.  No bruit.  No hepatosplenomegaly.  No hepatojugular reflux.  No masses. Pulses are intact.  No edema. NEURO:  Nonfocal. SKIN:  Warm and dry. MUSCULOSKELETAL:  No muscular pain.  Her current EKG shows sinus rhythm and is normal.  Stress EKG showed exercise-induced left bundle-branch block with T-wave inversions and recovery nonsustained VT.  Stress echo showed marked anterior, apical, and septal wall motion abnormality with LV cavity dilatation.  IMPRESSION: 1. Chest pain with positive stress echo.  We will admit to hospital.     Aspirin, Effient, and heparin.  We will also start low-dose Coreg.  The patient is to have catheterization in the morning.  Risks are     discussed, willing to proceed.  Since the patient is totally     asymptomatic in the hospital, is less than a minute away.  Her     husband will drive her there.  She already has a bed on telemetry     and 3100, and I have called the Cath Lab to arrange catheterization     with either Dr. Clifton James or Dr. Kirke Corin. 2. Cystic breast disease.  Followup mammogram on a yearly basis. 3. Back pain.  P.r.n. analgesics.  Follow up with Dr. Tawanna Cooler.     Noralyn Pick. Eden Emms, MD, [facility]     PCN/MEDQ  D:  01/15/2011  T:  01/16/2011  Job:  [id number]  Electronically Signed by Charlton Haws MD [facility] on 01/18/2011 10:48:06 AM

## 2011-01-18 NOTE — Telephone Encounter (Signed)
Ok to [facility] to order rehab. Fu as scheduled Angelica [name]

## 2011-01-18 NOTE — Telephone Encounter (Signed)
Pt husband calling to clarify exercise instructions. Pt was told exercise 5 min 2x/day walking Pt should walk 5-7x/weeks In 4-6 wks-hills could be added.   Cardiac rehab center-optional that pt want to pursue. When should cardiac rehab start?  Pt husband also wants to discuss how long it will be for pt to be able to travel in the car, for 2 hours, vacation place for rest in mountains.

## 2011-01-18 NOTE — Telephone Encounter (Signed)
I spoke with the patient's husband and he just wanted to clarify exercise for the patient. He states that they were told she should walk 5 minutes twice daily and start to add a minute on each day until she reaches 15 minutes twice daily, then she can go to 30 minutes once daily 5-7 days a week. They were she could add hills about 4-6 weeks out. He states that they have a dog kennel up an incline about 50 yards out. He states he has a 4 wheeler he can ride the patient on. I explained that he should do this for at least the first 2 weeks and then she can walk up as tolerated. He also wanted to know if the patient could go to the mountains next week to a home there to relax. This is about 2-2&1/2 hours away and is located near a small hospital there. I explained she should be ok to do this if she feels ok. She should stop and get out move around. They are also interested in starting rehab at [facility]. I explained we can complete the order for this and send it to [facility]. They are agreeable. The patient is due to fly to [place] on 02/01/11 for her job. They were wanting to know if she should do this. Her post hospital visit is not until 10/17 in [place]. I explained I will forward this to Dr. Jens Som to see how he feels about her flying to [place] and does he think her post hospital visit needs to be moved up. They are willing to come to [facility] for followup. I advised that I will ask Dr. Ludwig Clarks nurse to f/u with them next week.

## 2011-01-21 NOTE — Telephone Encounter (Signed)
Spoke with Angelica [name], she is aware of dr Ludwig Clarks recommendations. Other questions

## 2011-01-21 NOTE — Telephone Encounter (Signed)
Pt called again today with several questions.  Please call them back. He has A LOT of questions.  Please call today.  Pt has some brusing, hematoma on the groin area.  Please call him today. Thinks her f/u is too far away.

## 2011-01-21 NOTE — Telephone Encounter (Signed)
All of the pts questions answered Angelica [name]

## 2011-01-22 ENCOUNTER — Encounter: Payer: Self-pay | Admitting: *Deleted

## 2011-01-28 NOTE — Discharge Summary (Signed)
[name], [name] NO.:  [id number]  MEDICAL RECORD NO.:  [id number]  LOCATION:  2502                         FACILITY:  [facility]  PHYSICIAN:  Angelica Frieze. Jens Som, MD, FACCDATE OF BIRTH:  Feb 13, 1956  DATE OF ADMISSION:  01/15/2011 DATE OF DISCHARGE:  01/17/2011                              DISCHARGE SUMMARY   PRIMARY CARDIOLOGIST:  Angelica Frieze. Jens Som, MD, [facility]  PRIMARY CARE PROVIDER:  Eugenio Hoes. Tawanna Cooler, MD  DISCHARGE DIAGNOSIS:  Unstable angina.  SECONDARY DIAGNOSES: 1. Coronary artery disease status, post percutaneous coronary     intervention and drug-eluting stent placed in the proximal left     anterior descending this admission. 2. Nonsustained ventricular tachycardia occurring during the stress     test in the outpatient setting. 3. Hyperlipidemia. 4. Allergic rhinitis. 5. History of left-sided cystic breast disease. 6. Asthma. 7. Status post bilateral breast implant.  ALLERGIES:  PREDNISONE.  PROCEDURES:  Left heart cardiac catheterization performed on January 16, 2011, revealing a 95% ostial stenosis in the left anterior descending with otherwise nonobstructive disease and spasm noted in the proximal right coronary artery.  The left anterior descending was successfully stented with placement of a 3.0 x 12 mm Promus Element Plus drug-eluting stent.  HISTORY OF PRESENT ILLNESS:  This is a 55 year old female without prior cardiac history who was recently seen in the clinic by Dr. Olga [name] with complaints of right scapular discomfort occurring at rest as well as with exertion.  She was subsequently set up for and underwent a stress echocardiogram on January 15, 2011, when during the exam she was noted to have exercise-induced left bundle-branch block followed by nonsustained ventricular tachycardia and back and right arm pain. Echocardiographic imaging showed marked LV cavity dilatation and septal and apical anterior wall hypokineses  consistent with ischemia.  At the end of the test, the patient was pain free and decision was made to admit her for further evaluation and diagnostic catheterization.  HOSPITAL COURSE:  Following admission, the patient had no recurrence of chest discomfort.  She underwent diagnostic catheterization on January 16, 2011, which showed 95% stenosis in the ostial LAD.  This was felt to be the culprit for her symptoms as well as her finding of stress echocardiogram and this was subsequently stented with a 3.0 x 12 mm Promus Element Plus drug-eluting stent.  The patient tolerated the procedure well and postprocedure has been ambulating without difficulty. She did have one episode of right scapular discomfort associated with belching, and followup EKG and cardiac enzymes have been within normal limits.  The patient will be discharged home today in good condition.  DISCHARGE LABORATORY DATA:  Hemoglobin 13.6, hematocrit 39.7, WBC 9.3, and platelets 396.  Sodium 137, potassium 4.1, chloride 104, CO2 of 22, BUN 10, creatinine 0.53, glucose 89, and calcium 9.7.  CK 46, MB 2.0, and troponin I less than 0.30.  DISPOSITION:  The patient will be discharged home today in good condition.  FOLLOWUP PLANS AND APPOINTMENTS:  The patient will follow up with Dr. Olga [name] in our [employer] office on February 20, 2011, at 2:15 p.m.  She will follow up with Dr. Tawanna [name] as scheduled.  DISCHARGE MEDICATIONS: 1. Aspirin 81  mg daily. 2. Carvedilol 3.125 mg b.i.d. 3. Nitroglycerin 0.4 mg sublingual p.r.n. chest pain. 4. Prasugrel 10 mg daily. 5. Rosuvastatin 20 mg nightly.  OUTSTANDING LABORATORY DATA AND STUDIES:  Followup lipids and LFTs in 6- 8 weeks given new statin therapy.  DURATION OF DISCHARGE ENCOUNTER:  60 minutes including physician time.     Angelica [name], ANP   ______________________________ Angelica Frieze. Jens Som, MD, [facility]    CB/MEDQ  D:  01/17/2011  T:  01/17/2011  Job:   [id number]  cc:   Angelica Gens A. Tawanna Cooler, MD  Electronically Signed by Angelica [name] ANP on 01/24/2011 03:49:58 PM Electronically Signed by Angelica Millers MD [facility] on 01/28/2011 04:05:44 PM

## 2011-02-01 ENCOUNTER — Telehealth: Payer: Self-pay | Admitting: Cardiology

## 2011-02-01 NOTE — Telephone Encounter (Signed)
LMTC

## 2011-02-01 NOTE — Telephone Encounter (Signed)
S/p stent  Placement on 9/12. Pt has a cold wants to know what medication she can take.

## 2011-02-01 NOTE — Telephone Encounter (Signed)
Returning call back to nurse call back after 1:30

## 2011-02-01 NOTE — Telephone Encounter (Signed)
I spoke with the patient and recommended claritin/ coricidin HBP/ and plain robitussin as needed. The patient verbalizes understanding.

## 2011-02-05 ENCOUNTER — Encounter: Payer: Self-pay | Admitting: Cardiology

## 2011-02-11 ENCOUNTER — Encounter (HOSPITAL_COMMUNITY)
Admission: RE | Admit: 2011-02-11 | Discharge: 2011-02-11 | Disposition: A | Discharge status: Home / Self Care | Payer: 59 | Source: Ambulatory Visit | Attending: Cardiology | Admitting: Cardiology

## 2011-02-11 DIAGNOSIS — E785 Hyperlipidemia, unspecified: Secondary | ICD-10-CM | POA: Insufficient documentation

## 2011-02-11 DIAGNOSIS — R079 Chest pain, unspecified: Secondary | ICD-10-CM | POA: Insufficient documentation

## 2011-02-11 DIAGNOSIS — Z5189 Encounter for other specified aftercare: Secondary | ICD-10-CM | POA: Insufficient documentation

## 2011-02-11 DIAGNOSIS — Z9861 Coronary angioplasty status: Secondary | ICD-10-CM | POA: Insufficient documentation

## 2011-02-11 DIAGNOSIS — I200 Unstable angina: Secondary | ICD-10-CM | POA: Insufficient documentation

## 2011-02-11 DIAGNOSIS — I447 Left bundle-branch block, unspecified: Secondary | ICD-10-CM | POA: Insufficient documentation

## 2011-02-11 DIAGNOSIS — J45909 Unspecified asthma, uncomplicated: Secondary | ICD-10-CM | POA: Insufficient documentation

## 2011-02-11 DIAGNOSIS — I2510 Atherosclerotic heart disease of native coronary artery without angina pectoris: Secondary | ICD-10-CM | POA: Insufficient documentation

## 2011-02-11 DIAGNOSIS — R000 Tachycardia, unspecified: Secondary | ICD-10-CM | POA: Insufficient documentation

## 2011-02-13 ENCOUNTER — Encounter: Payer: Self-pay | Admitting: Cardiology

## 2011-02-13 ENCOUNTER — Ambulatory Visit (INDEPENDENT_AMBULATORY_CARE_PROVIDER_SITE_OTHER): Payer: 59 | Admitting: Cardiology

## 2011-02-13 ENCOUNTER — Encounter (HOSPITAL_COMMUNITY): Payer: 59

## 2011-02-13 DIAGNOSIS — I2510 Atherosclerotic heart disease of native coronary artery without angina pectoris: Secondary | ICD-10-CM

## 2011-02-13 DIAGNOSIS — E785 Hyperlipidemia, unspecified: Secondary | ICD-10-CM | POA: Insufficient documentation

## 2011-02-13 NOTE — Patient Instructions (Addendum)
Your physician wants you to follow-up in: 6 MONTHS  You will receive a reminder letter in the mail two months in advance. If you don't receive a letter, please call our office to schedule the follow-up appointment.   Your physician recommends that you return for lab work in: 4 WEEKS=WEEK OF November 5TH   STOP CARVEDILOL

## 2011-02-13 NOTE — Assessment & Plan Note (Signed)
Continue aspirin, effient and statin. Patient complaining of some fatigue. Discontinue Coreg.

## 2011-02-13 NOTE — Assessment & Plan Note (Signed)
Continue statin. Check lipids and liver. She also has mild bruising. Check CBC.

## 2011-02-13 NOTE — Progress Notes (Signed)
HPI: 55 year old female for followup of coronary disease. Recently seen for chest pain. Stress echo was markedly abnormal with nonsustained ventricular tachycardia and ischemia in the anteroseptal wall and apex. Patient underwent cardiac catheterization in September of 2012 which revealed a 95% ostial LAD lesion. The patient had PCI with a drug-eluting stent. There was some jailing of the circumflex. There was recommendation for aspirin indefinitely and effient for at least one year and possibly indefinitely. Since DC, she has mild dyspnea on exertion but no orthopnea, PND, pedal edema or syncope. Occasional "twinge" of chest pain but no symptoms similar to those before her stent. Current Outpatient Prescriptions  Medication Sig Dispense Refill  . aspirin 81 MG tablet Take 81 mg by mouth daily.        . carvedilol (COREG) 3.125 MG tablet Take 3.125 mg by mouth 2 (two) times daily with a meal.        . prasugrel (EFFIENT) 10 MG TABS Take 10 mg by mouth daily.        . rosuvastatin (CRESTOR) 20 MG tablet Take 20 mg by mouth daily.           Past Medical History  Diagnosis Date  . ALLERGIC RHINITIS 03/02/2007  . BREAST CYST, LEFT 11/23/2009  . 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 CAD (coronary artery disease)     Past Surgical History  Procedure Date  . Breast surgery     BILAT IMPLANTS  . Breast biopsy     History   Social History  . Marital Status: Married    Spouse Name: N/A    Number of Children: N/A  . 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 Smoking status: Never Smoker   . Smokeless tobacco: Never Used  . Alcohol Use: Yes     Occasional  . Drug Use: No  . Sexually Active: Not on file   Other Topics Concern  . Not on file   Social History Narrative   Occupation: Never SmokedAlcohol use- noMarriedDrug use- noRegular Exercise- yes     ROS: no fevers or chills, productive cough, hemoptysis,  dysphasia, odynophagia, melena, hematochezia, dysuria, hematuria, rash, seizure activity, orthopnea, PND, pedal edema, claudication. Remaining systems are negative.  Physical Exam: Well-developed well-nourished in no acute distress.  Skin is warm and dry.  HEENT is normal.  Neck is supple. No thyromegaly.  Chest is clear to auscultation with normal expansion.  Cardiovascular exam is regular rate and rhythm.  Abdominal exam nontender or distended. No masses palpated. Extremities show no edema. neuro grossly intact  ECG NSR with no ST changes

## 2011-02-15 ENCOUNTER — Encounter (HOSPITAL_COMMUNITY): Payer: 59

## 2011-02-18 ENCOUNTER — Encounter (HOSPITAL_COMMUNITY): Payer: 59

## 2011-02-19 NOTE — Cardiovascular Report (Signed)
NAMEANNAYA, BANGERT NO.:  [id number]  MEDICAL RECORD NO.:  [id number]  LOCATION:  2502                         FACILITY:  [facility]  PHYSICIAN:  Lorine Bears, MD     DATE OF BIRTH:  [birth year]  DATE OF PROCEDURE: DATE OF DISCHARGE:                           CARDIAC CATHETERIZATION   REFERRING CARDIOLOGIST:  [name], MD, [facility]  PROCEDURES PERFORMED: 1. Left heart catheterization. 2. Coronary angiography. 3. Angioplasty and drug-eluting stent placement to a 95% ostial left     anterior descending artery stenosis resulting in 0% residual     stenosis with TIMI 3 flow.  INDICATION AND CLINICAL HISTORY:  This is a 55 year old female with no previous cardiac history of significant risk factors for coronary artery disease.  She presented with atypical exertional back pain.  She underwent a stress echocardiogram which showed severe anterior wall ischemia.  The patient was admitted directly from the clinic yesterday and was started on aspirin, Effient, and heparin.  Due to her symptoms and significantly abnormal stress test, cardiac catheterization was recommended.  Risks, benefits, and alternatives were discussed with the patient.  ACCESS:  Right radial artery with a 5-French sheath.  STUDY DETAILS:  A standard informed consent was obtained.  The right radial area was prepped in a sterile fashion.  It was anesthetized with 1% lidocaine.  A 3 mg of verapamil was given through the sheath.  35 units of unfractionated heparin was given intravenously.  Coronary angiography was performed with a TIG catheter.  The patient was noted to have severe spasm causing difficulty manipulating the catheter.  Thus, during the exchange the patient was given 100 mcg of nitroglycerin through the sheath and another 3 mg of verapamil.  A pigtail catheter was then used to record the left ventricular pressure.  Left ventricular angiography was not performed given that she did  have an echocardiogram done.  The patient was found to have a 95% ostial lesion in the left anterior descending artery and it was decided to proceed with angioplasty and stent placement.  INTERVENTIONAL PROCEDURE NOTE:  I decided to stay with a 5-French sheath given that her radial artery is actually small and she did have severe spasm during diagnostic cardiac cath.  She was given another 3 mg of verapamil through the sheath before advancing the guiding catheter. Bivalirudin was already initiated with therapeutic ACT.  An EBU 3.0 guiding catheter was used.  The lesion was crossed with a Prowater wire. It was predilated with 2.5 x 8 Trek balloon to 8 atmospheres twice.  I then placed a 3.0 x 12-mm Promus Element drug-eluting stent to the ostial LAD and deployed it at 10 atmospheres.  This was really a difficult placement.  The stent had to be slightly in the left main with diffuse struts covering the left circumflex in order to be able to cover the lesion completely.  The stent was postdilated proximally with a 3.25 x 8-mm [place] Trek balloon.  Angiography showed excellent results.  The ostial left circumflex was partially jailed with the stent, but there was only mild stenosis ostially with a good flow.  STUDY FINDINGS:  Hemodynamic findings:  Left ventricular pressure was 83/2 with  the left ventricular end-diastolic pressure of 2 mmHg.  Aortic pressure was 84/57 with a mean pressure of 69 mmHg.  CORONARY ANGIOGRAPHY: 1. Left main coronary artery:  The vessel is normal in size and free     of significant disease. 2. Left anterior descending artery:  The vessel is actually large in     size.  There is a 95% discrete lesion noted at the ostium.  There     is another lesion in the proximal LAD which is 30% at large first     diagonal.  The rest of the LAD is free of significant disease.  The     first diagonal is very large in size and free of significant     disease.  Second and third  diagonals are very small in size. 3. Left circumflex artery:  The vessel is overall medium in size and     nondominant.  It has no significant disease.  OM1 is medium in size     and free of significant disease.  OM2 is small in size and OM3 is     normal in size. 4. Right coronary artery:  The vessel is normal in size and dominant.     There was a catheter-induced spasm proximally which resolved upon     pullback.  Artery is overall smooth and free of significant     disease.  The PDA is normal in size without significant disease.     There is mainly one large posterolateral branch and another tiny     one and both of them are free of significant disease.  STUDY CONCLUSIONS: 1. Severe single-vessel coronary artery disease with 95% ostial LAD     stenosis. 2. Low normal left ventricular end-diastolic pressure. 3. Successful angioplasty and drug-eluting stent placement to the     ostial LAD lesion resulting in 0% residual stenosis.  The stent was     a Promus 3.0 x 12 mm drug-eluting stent.  The stent had to be     slightly positioned in the left main in order to actually cover the     whole lesion.  The left circumflex was only partially jailed, but     there was good flow and it was 30% ostial lesion which was left to     be treated medically.  The stent was postdilated proximally with a     3.25 noncompliant balloon.  RECOMMENDATIONS:  I recommend aspirin daily indefinitely as well as Effient once daily for at least 1 year and possibly lifelong due to the location of the stent.  Aggressive treatment of risk factors is recommended as well.     Lorine Bears, MD     MA/MEDQ  D:  01/16/2011  T:  01/17/2011  Job:  [id number]  cc:   [name], MD, [facility]  Electronically Signed by Lorine Bears MD on 02/19/2011 12:11:17 PM

## 2011-02-20 ENCOUNTER — Encounter (HOSPITAL_COMMUNITY): Payer: 59

## 2011-02-20 ENCOUNTER — Ambulatory Visit: Payer: Self-pay | Admitting: Cardiology

## 2011-02-21 ENCOUNTER — Ambulatory Visit: Payer: PRIVATE HEALTH INSURANCE | Admitting: Cardiology

## 2011-02-22 ENCOUNTER — Encounter (HOSPITAL_COMMUNITY): Payer: 59

## 2011-02-22 ENCOUNTER — Telehealth: Payer: Self-pay | Admitting: Cardiology

## 2011-02-22 DIAGNOSIS — E7800 Pure hypercholesterolemia, unspecified: Secondary | ICD-10-CM

## 2011-02-22 NOTE — Telephone Encounter (Signed)
Pt is concerned because she is bruising really bad and they are beginning to hurt a lot and wants to talk to you about it

## 2011-02-25 ENCOUNTER — Encounter (HOSPITAL_COMMUNITY): Payer: 59

## 2011-02-25 NOTE — Telephone Encounter (Signed)
SPOKE WITH PT IS  COMPLAINING OF  BRUSING ON LEGS AS WELL AS LEG PAIN FOR  COUPLE OF WEEKS  SUGGESTED FOR PT TO HOLD CRESTOR FOR COUPLE OF WEEKS TO SEE IF PAIN IMPROVED  IS SCHEDULED IN NOV FOR LIPID AN LIVER  WOULD RATHER CONT WITH CRESTOR TO SEE LAB VALUES, INFORMED IF PAIN WORSENS MAY HOLD MED TO SEE IF GETS BETTER ALSO  IS TAKING EFFIENT 10 MG AND ASA 81 MG  WANTED TO KNOW IF COULD TRY AND TAKE EFFIENT 7 MG  PT AWARE  WILL FORWARD TO DR CRENSHAW  FOR REVEIW./CY

## 2011-02-25 NOTE — Telephone Encounter (Signed)
Pt calling back re message from Friday re bruising, pls call before 11a or after 1p, [phone]

## 2011-02-25 NOTE — Telephone Encounter (Signed)
Continue effient and asa; check cbc and ck Olga Millers

## 2011-02-27 ENCOUNTER — Encounter (HOSPITAL_COMMUNITY): Payer: 59

## 2011-02-27 NOTE — Telephone Encounter (Signed)
Addended by: Freddi Starr on: 02/27/2011 12:04 PM   Modules accepted: Orders

## 2011-02-27 NOTE — Patient Instructions (Signed)
Opened by mistake Deliah Goody

## 2011-02-27 NOTE — Telephone Encounter (Signed)
Spoke with pt, she is scheduled for labs on 03-11-11. Angelica [name]

## 2011-03-01 ENCOUNTER — Encounter (HOSPITAL_COMMUNITY): Payer: 59

## 2011-03-01 ENCOUNTER — Ambulatory Visit (HOSPITAL_COMMUNITY): Payer: 59

## 2011-03-04 ENCOUNTER — Encounter (HOSPITAL_COMMUNITY): Payer: 59

## 2011-03-06 ENCOUNTER — Encounter (HOSPITAL_COMMUNITY): Payer: 59

## 2011-03-08 ENCOUNTER — Encounter (HOSPITAL_COMMUNITY): Payer: 59

## 2011-03-08 DIAGNOSIS — Z5189 Encounter for other specified aftercare: Secondary | ICD-10-CM | POA: Insufficient documentation

## 2011-03-08 DIAGNOSIS — Z9861 Coronary angioplasty status: Secondary | ICD-10-CM | POA: Insufficient documentation

## 2011-03-08 DIAGNOSIS — I200 Unstable angina: Secondary | ICD-10-CM | POA: Insufficient documentation

## 2011-03-08 DIAGNOSIS — R079 Chest pain, unspecified: Secondary | ICD-10-CM | POA: Insufficient documentation

## 2011-03-08 DIAGNOSIS — J45909 Unspecified asthma, uncomplicated: Secondary | ICD-10-CM | POA: Insufficient documentation

## 2011-03-08 DIAGNOSIS — I447 Left bundle-branch block, unspecified: Secondary | ICD-10-CM | POA: Insufficient documentation

## 2011-03-08 DIAGNOSIS — I2510 Atherosclerotic heart disease of native coronary artery without angina pectoris: Secondary | ICD-10-CM | POA: Insufficient documentation

## 2011-03-08 DIAGNOSIS — E785 Hyperlipidemia, unspecified: Secondary | ICD-10-CM | POA: Insufficient documentation

## 2011-03-08 DIAGNOSIS — R000 Tachycardia, unspecified: Secondary | ICD-10-CM | POA: Insufficient documentation

## 2011-03-11 ENCOUNTER — Encounter (HOSPITAL_COMMUNITY): Payer: 59

## 2011-03-11 ENCOUNTER — Other Ambulatory Visit (INDEPENDENT_AMBULATORY_CARE_PROVIDER_SITE_OTHER): Payer: 59 | Admitting: *Deleted

## 2011-03-11 ENCOUNTER — Other Ambulatory Visit: Payer: 59 | Admitting: *Deleted

## 2011-03-11 ENCOUNTER — Ambulatory Visit (INDEPENDENT_AMBULATORY_CARE_PROVIDER_SITE_OTHER): Payer: 59 | Admitting: *Deleted

## 2011-03-11 DIAGNOSIS — E785 Hyperlipidemia, unspecified: Secondary | ICD-10-CM

## 2011-03-11 DIAGNOSIS — E7800 Pure hypercholesterolemia, unspecified: Secondary | ICD-10-CM

## 2011-03-11 DIAGNOSIS — I2510 Atherosclerotic heart disease of native coronary artery without angina pectoris: Secondary | ICD-10-CM

## 2011-03-11 LAB — CBC WITH DIFFERENTIAL/PLATELET
Basophils Relative: 0.4 % (ref 0.0–3.0)
Eosinophils Absolute: 0.2 10*3/uL (ref 0.0–0.7)
Eosinophils Relative: 3.6 % (ref 0.0–5.0)
HCT: 39.1 % (ref 36.0–46.0)
Hemoglobin: 13.3 g/dL (ref 12.0–15.0)
MCHC: 33.9 g/dL (ref 30.0–36.0)
MCV: 101.2 fl — ABNORMAL HIGH (ref 78.0–100.0)
Monocytes Absolute: 0.5 10*3/uL (ref 0.1–1.0)
Neutro Abs: 3.2 10*3/uL (ref 1.4–7.7)
RBC: 3.87 Mil/uL (ref 3.87–5.11)

## 2011-03-11 LAB — LIPID PANEL
Cholesterol: 144 mg/dL (ref 0–200)
LDL Cholesterol: 50 mg/dL (ref 0–99)
Triglycerides: 36 mg/dL (ref 0.0–149.0)
VLDL: 7.2 mg/dL (ref 0.0–40.0)

## 2011-03-11 LAB — HEPATIC FUNCTION PANEL
Bilirubin, Direct: 0.1 mg/dL (ref 0.0–0.3)
Total Bilirubin: 0.9 mg/dL (ref 0.3–1.2)

## 2011-03-11 NOTE — Progress Notes (Signed)
Pt visited 945 am class today.

## 2011-03-13 ENCOUNTER — Encounter (HOSPITAL_COMMUNITY): Admission: RE | Admit: 2011-03-13 | Payer: 59 | Source: Ambulatory Visit

## 2011-03-15 ENCOUNTER — Encounter (HOSPITAL_COMMUNITY): Admission: RE | Admit: 2011-03-15 | Payer: 59 | Source: Ambulatory Visit

## 2011-03-18 ENCOUNTER — Encounter (HOSPITAL_COMMUNITY): Payer: 59

## 2011-03-19 ENCOUNTER — Ambulatory Visit: Payer: 59 | Admitting: Family Medicine

## 2011-03-20 ENCOUNTER — Encounter (HOSPITAL_COMMUNITY): Admission: RE | Admit: 2011-03-20 | Payer: 59 | Source: Ambulatory Visit

## 2011-03-22 ENCOUNTER — Encounter (HOSPITAL_COMMUNITY)
Admission: RE | Admit: 2011-03-22 | Discharge: 2011-03-22 | Disposition: A | Discharge status: Home / Self Care | Payer: 59 | Source: Ambulatory Visit | Attending: Cardiology | Admitting: Cardiology

## 2011-03-25 ENCOUNTER — Encounter (HOSPITAL_COMMUNITY)
Admission: RE | Admit: 2011-03-25 | Discharge: 2011-03-25 | Disposition: A | Discharge status: Home / Self Care | Payer: 59 | Source: Ambulatory Visit | Attending: Cardiology | Admitting: Cardiology

## 2011-03-27 ENCOUNTER — Encounter (HOSPITAL_COMMUNITY): Admission: RE | Admit: 2011-03-27 | Payer: 59 | Source: Ambulatory Visit

## 2011-03-29 ENCOUNTER — Encounter (HOSPITAL_COMMUNITY): Payer: 59

## 2011-04-01 ENCOUNTER — Encounter (HOSPITAL_COMMUNITY)
Admission: RE | Admit: 2011-04-01 | Discharge: 2011-04-01 | Disposition: A | Discharge status: Home / Self Care | Payer: 59 | Source: Ambulatory Visit | Attending: Cardiology | Admitting: Cardiology

## 2011-04-03 ENCOUNTER — Encounter (HOSPITAL_COMMUNITY)
Admission: RE | Admit: 2011-04-03 | Discharge: 2011-04-03 | Disposition: A | Discharge status: Home / Self Care | Payer: 59 | Source: Ambulatory Visit | Attending: Cardiology | Admitting: Cardiology

## 2011-04-05 ENCOUNTER — Encounter (HOSPITAL_COMMUNITY): Payer: 59

## 2011-04-08 ENCOUNTER — Encounter (HOSPITAL_COMMUNITY)
Admission: RE | Admit: 2011-04-08 | Discharge: 2011-04-08 | Disposition: A | Discharge status: Home / Self Care | Payer: 59 | Source: Ambulatory Visit | Attending: Cardiology | Admitting: Cardiology

## 2011-04-08 ENCOUNTER — Encounter: Payer: Self-pay | Admitting: Family Medicine

## 2011-04-08 ENCOUNTER — Ambulatory Visit (INDEPENDENT_AMBULATORY_CARE_PROVIDER_SITE_OTHER): Payer: 59 | Admitting: Family Medicine

## 2011-04-08 DIAGNOSIS — Z5189 Encounter for other specified aftercare: Secondary | ICD-10-CM | POA: Insufficient documentation

## 2011-04-08 DIAGNOSIS — J45909 Unspecified asthma, uncomplicated: Secondary | ICD-10-CM | POA: Insufficient documentation

## 2011-04-08 DIAGNOSIS — Z9861 Coronary angioplasty status: Secondary | ICD-10-CM | POA: Insufficient documentation

## 2011-04-08 DIAGNOSIS — I447 Left bundle-branch block, unspecified: Secondary | ICD-10-CM | POA: Insufficient documentation

## 2011-04-08 DIAGNOSIS — R000 Tachycardia, unspecified: Secondary | ICD-10-CM | POA: Insufficient documentation

## 2011-04-08 DIAGNOSIS — I2510 Atherosclerotic heart disease of native coronary artery without angina pectoris: Secondary | ICD-10-CM | POA: Insufficient documentation

## 2011-04-08 DIAGNOSIS — R079 Chest pain, unspecified: Secondary | ICD-10-CM | POA: Insufficient documentation

## 2011-04-08 DIAGNOSIS — G8929 Other chronic pain: Secondary | ICD-10-CM | POA: Insufficient documentation

## 2011-04-08 DIAGNOSIS — I200 Unstable angina: Secondary | ICD-10-CM | POA: Insufficient documentation

## 2011-04-08 DIAGNOSIS — E785 Hyperlipidemia, unspecified: Secondary | ICD-10-CM | POA: Insufficient documentation

## 2011-04-08 DIAGNOSIS — R1013 Epigastric pain: Secondary | ICD-10-CM

## 2011-04-08 LAB — LIPID PANEL
Cholesterol: 162 mg/dL (ref 0–200)
HDL: 93.8 mg/dL (ref >39.00)
LDL Cholesterol: 57 mg/dL (ref 0–99)
Total CHOL/HDL Ratio: 2
Triglycerides: 55 mg/dL (ref 0.0–149.0)

## 2011-04-08 LAB — H. PYLORI ANTIBODY, IGG: H Pylori IgG: NEGATIVE

## 2011-04-08 NOTE — Progress Notes (Signed)
  Subjective:    Patient ID: Angelica [name], female    DOB: 01-20-1956, 55 y.o.   MRN: [id number]  HPI Angelica [name] is a 55 year old, married female, nonsmoker, who comes in today for evaluation of two problems.  This past summer.  She was having back pain.  At one time she went to the emergency room had an evaluation.  It was normal.  She then continued to have back pain and was seen in urgent care again, evaluation negative.  She finally self-referred to Dr. Jens Som, who did a stress test.  Three minutes into the stress test.  They stopped And sent for to the  the hospital and immediately went to the catheter lab.  A 9 5% LAD lesion was discovered and stented.  She's done well since that time.  No chest pain.  No further back pain  No risk factors.  Lipids, blood pressure blood sugar, weight nonsmoker.  Family history all negative.  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  We will look at the inflammatory markers also get bilateral carotid Dopplers to assess.  Carotid artery.  Also since, September she's had midepigastric abdominal pain that comes and goes.  However, all day long.  She has a lot of belching, burping, and indigestion.  No fever, chills, nausea, vomiting, diarrhea, or weight loss.   Review of Systems General and metabolic, cardiovascular, GI, review of systems otherwise negative    Objective:   Physical Exam  Well-developed well-nourished, female, in no acute distress best for evaluations shows normal carotids no bruits.  Cardiac exam negative.  No abdominal aortic bruit.  Femoral is normal.  No bruits.  Peripheral pulses palpable and normal.  Abdominal examination negative      Assessment & Plan:  Coronary artery disease, recent stent 95% LAD lesion, however, no underlying risk factors.  Plan check inflammatory markers and a bilateral carotid Dopplers.  Midepigastric abdominal pain, pain check labs, lactose free  diet.  Follow-up in one week

## 2011-04-08 NOTE — Patient Instructions (Signed)
Continue your current medications.  Stay on a complete lactose free diet.  We will get you to set up for the labs to look at the inflammatory markers and the carotid Doppler.  Follow-up in one week, sooner if any problems

## 2011-04-10 ENCOUNTER — Encounter (HOSPITAL_COMMUNITY)
Admission: RE | Admit: 2011-04-10 | Discharge: 2011-04-10 | Disposition: A | Discharge status: Home / Self Care | Payer: 59 | Source: Ambulatory Visit | Attending: Cardiology | Admitting: Cardiology

## 2011-04-12 ENCOUNTER — Encounter (HOSPITAL_COMMUNITY): Payer: 59

## 2011-04-15 ENCOUNTER — Encounter (HOSPITAL_COMMUNITY)
Admission: RE | Admit: 2011-04-15 | Discharge: 2011-04-15 | Disposition: A | Discharge status: Home / Self Care | Payer: 59 | Source: Ambulatory Visit | Attending: Cardiology | Admitting: Cardiology

## 2011-04-16 ENCOUNTER — Other Ambulatory Visit: Payer: Self-pay | Admitting: Family Medicine

## 2011-04-16 DIAGNOSIS — Z1231 Encounter for screening mammogram for malignant neoplasm of breast: Secondary | ICD-10-CM

## 2011-04-17 ENCOUNTER — Encounter (HOSPITAL_COMMUNITY)
Admission: RE | Admit: 2011-04-17 | Discharge: 2011-04-17 | Disposition: A | Discharge status: Home / Self Care | Payer: 59 | Source: Ambulatory Visit | Attending: Cardiology | Admitting: Cardiology

## 2011-04-19 ENCOUNTER — Encounter (HOSPITAL_COMMUNITY)
Admission: RE | Admit: 2011-04-19 | Discharge: 2011-04-19 | Disposition: A | Discharge status: Home / Self Care | Payer: 59 | Source: Ambulatory Visit | Attending: Cardiology | Admitting: Cardiology

## 2011-04-22 ENCOUNTER — Encounter (HOSPITAL_COMMUNITY)
Admission: RE | Admit: 2011-04-22 | Discharge: 2011-04-22 | Disposition: A | Discharge status: Home / Self Care | Payer: 59 | Source: Ambulatory Visit | Attending: Cardiology | Admitting: Cardiology

## 2011-04-22 ENCOUNTER — Telehealth: Payer: Self-pay

## 2011-04-22 ENCOUNTER — Telehealth: Payer: Self-pay | Admitting: Cardiology

## 2011-04-22 NOTE — Telephone Encounter (Signed)
Pt would like lab results for lipid protein. Pls advise.

## 2011-04-22 NOTE — Telephone Encounter (Signed)
Patient called, needing samples of Crestor 20mg ,and wanting prescription for Crestor 20 mg and Effient 10 mg called to [phone].Samples of Crestor 20 mg left at front desk.Crestor 20 mg # 90 3 refills,and Effient 10 mg # 90 3 refills called to [facility] [phone].

## 2011-04-22 NOTE — Telephone Encounter (Signed)
New msg Pt needs crestor she is out of pills as of today She is going to cardiac rehab you can leave a message

## 2011-04-24 ENCOUNTER — Encounter (HOSPITAL_COMMUNITY)
Admission: RE | Admit: 2011-04-24 | Discharge: 2011-04-24 | Disposition: A | Discharge status: Home / Self Care | Payer: 59 | Source: Ambulatory Visit | Attending: Cardiology | Admitting: Cardiology

## 2011-04-25 ENCOUNTER — Other Ambulatory Visit: Payer: Self-pay | Admitting: *Deleted

## 2011-04-25 MED ORDER — SIMVASTATIN 10 MG PO TABS: 10.0000 mg | ORAL_TABLET | Freq: Every day | ORAL | Status: DC

## 2011-04-25 NOTE — Telephone Encounter (Signed)
Fleet Contras as we discussed I called Lorina and left her a message, I would take a low-dose statin, 10 mg of Zocor bedtime.  Follow-up lipid and liver panel in two months.  Also add a baby aspirin

## 2011-04-26 ENCOUNTER — Encounter (HOSPITAL_COMMUNITY)
Admission: RE | Admit: 2011-04-26 | Discharge: 2011-04-26 | Disposition: A | Discharge status: Home / Self Care | Payer: 59 | Source: Ambulatory Visit | Attending: Cardiology | Admitting: Cardiology

## 2011-04-26 ENCOUNTER — Other Ambulatory Visit: Payer: Self-pay | Admitting: *Deleted

## 2011-04-26 DIAGNOSIS — R0989 Other specified symptoms and signs involving the circulatory and respiratory systems: Secondary | ICD-10-CM

## 2011-04-29 ENCOUNTER — Encounter: Payer: 59 | Admitting: Cardiology

## 2011-04-29 ENCOUNTER — Encounter (HOSPITAL_COMMUNITY): Payer: 59

## 2011-05-01 ENCOUNTER — Telehealth: Payer: Self-pay | Admitting: Cardiology

## 2011-05-01 ENCOUNTER — Encounter (HOSPITAL_COMMUNITY): Payer: 59

## 2011-05-01 ENCOUNTER — Other Ambulatory Visit (INDEPENDENT_AMBULATORY_CARE_PROVIDER_SITE_OTHER): Payer: 59 | Admitting: Cardiology

## 2011-05-01 DIAGNOSIS — I70229 Atherosclerosis of native arteries of extremities with rest pain, unspecified extremity: Secondary | ICD-10-CM

## 2011-05-01 DIAGNOSIS — T148XXA Other injury of unspecified body region, initial encounter: Secondary | ICD-10-CM

## 2011-05-01 DIAGNOSIS — I743 Embolism and thrombosis of arteries of the lower extremities: Secondary | ICD-10-CM

## 2011-05-01 NOTE — Telephone Encounter (Signed)
FU Call: Pt calling back to speak to [facility]. Pt said she has bruising on both arms and it is continue to spread and wants to know if Dr. Jens Som can see pt ASAP.  Please call back.

## 2011-05-01 NOTE — Telephone Encounter (Signed)
Spoke with pt, she was on her way to [place] over the holiday and had some heart problems and was seen at [facility]. They tried a radial cath on the right but was unable to get access due to spasm so the cath was completed via the left radial. She is now calling because she has a large amount of bruising on the right forearm. She has marked the area with a pen and it has increased by about 1/2 inch in diameter overnight. Per dr cooper the pt will come to the office for a doppler of the right radial.

## 2011-05-01 NOTE — Telephone Encounter (Signed)
New msg Pt wants to talk to you about going to [facility] [id number] please call her back after 1030 this morning

## 2011-05-03 ENCOUNTER — Encounter (HOSPITAL_COMMUNITY): Payer: 59

## 2011-05-03 ENCOUNTER — Encounter (INDEPENDENT_AMBULATORY_CARE_PROVIDER_SITE_OTHER): Payer: 59 | Admitting: *Deleted

## 2011-05-03 ENCOUNTER — Ambulatory Visit: Payer: 59 | Admitting: Physician Assistant

## 2011-05-03 ENCOUNTER — Telehealth: Payer: Self-pay | Admitting: Cardiology

## 2011-05-03 DIAGNOSIS — R42 Dizziness and giddiness: Secondary | ICD-10-CM

## 2011-05-03 DIAGNOSIS — R0989 Other specified symptoms and signs involving the circulatory and respiratory systems: Secondary | ICD-10-CM

## 2011-05-03 NOTE — Telephone Encounter (Signed)
New problem;  C/o continue bruising on arm. Have an appt today with PV dept. Patient wants to know can someone see her today regarding this.

## 2011-05-06 ENCOUNTER — Encounter: Payer: Self-pay | Admitting: Physician Assistant

## 2011-05-06 ENCOUNTER — Ambulatory Visit (INDEPENDENT_AMBULATORY_CARE_PROVIDER_SITE_OTHER): Payer: 59 | Admitting: Physician Assistant

## 2011-05-06 ENCOUNTER — Telehealth: Payer: Self-pay | Admitting: Physician Assistant

## 2011-05-06 ENCOUNTER — Encounter (HOSPITAL_COMMUNITY): Payer: 59

## 2011-05-06 ENCOUNTER — Encounter (INDEPENDENT_AMBULATORY_CARE_PROVIDER_SITE_OTHER): Payer: 59

## 2011-05-06 DIAGNOSIS — I2510 Atherosclerotic heart disease of native coronary artery without angina pectoris: Secondary | ICD-10-CM

## 2011-05-06 DIAGNOSIS — K219 Gastro-esophageal reflux disease without esophagitis: Secondary | ICD-10-CM

## 2011-05-06 DIAGNOSIS — E785 Hyperlipidemia, unspecified: Secondary | ICD-10-CM

## 2011-05-06 DIAGNOSIS — R002 Palpitations: Secondary | ICD-10-CM | POA: Insufficient documentation

## 2011-05-06 MED ORDER — PANTOPRAZOLE SODIUM 40 MG PO TBEC: 40.0000 mg | DELAYED_RELEASE_TABLET | Freq: Every day | ORAL | Status: DC

## 2011-05-06 NOTE — Assessment & Plan Note (Signed)
I suspect she has symptomatic PVCs/PACs.  TSH was normal at [facility].  I will check a basic metabolic panel, magnesium and CBC today.  She will be placed on an event monitor.  I have advised her to take a half a tablet of her Toprol as needed for palpitations her now.  Followup with Dr. Olga Millers as scheduled.

## 2011-05-06 NOTE — Progress Notes (Signed)
[address] [place], [place]  [address] Phone: [phone] Fax:  [phone]  Date:  05/06/2011   Name:  Angelica [name]       DOB:  21-May-1955 MRN:  [id number]  PCP:  Dr. Tawanna Cooler Primary Cardiologist:  Dr. Olga Millers  Primary Electrophysiologist:  None    History of Present Illness: Angelica [name] is a 55 y.o. female who presents for post hospital follow up.  She has a h/o CAD.  She was recently seen by Dr. Olga Millers for chest pain. Stress echo was markedly abnormal with nonsustained ventricular tachycardia and ischemia in the anteroseptal wall and apex. Patient underwent cardiac catheterization in September of 2012 which revealed a 95% ostial LAD lesion. The patient had PCI with a drug-eluting stent. There was some jailing of the circumflex. There was recommendation for aspirin indefinitely and effient for at least one year and possibly indefinitely. She last saw Dr. Olga Millers in 10/12.    She developed palpitations and lightheadedness while [place] on [address].  She was eventually taken to [facility].  She subsequently underwent cardiac catheterization.  She had a right Radial approach.  There was apparently significant spasm.  She also has had significant bruising in her right arm.  The case was switched to her left radial artery.  She has had significant pain from the right radial approach and has seen Dr. Tonny Bollman here who has followed her with ultrasounds.  The right radial was felt to be occluded at one point.  She was told that her stent was open and her anatomy was stable.  I just received her records from [place].  Her LAD stent was patent and she had no other significant CAD (Proximal RCA 10%, left main 20%, small OM1 50%, mid LAD 20%, D2 20%).  She was given Toprol-XL 25 mg daily at discharge.  TSH was 2.27.  She notes a history of belching that predates her PCI.  Is not really exertional.  She has an occasional twinge of chest pain.  She  denies any recurrent anginal like she had prior to her PCI.  She denies significant shortness of breath.  She denies orthopnea, PND or edema.  She denies syncope.  She does have occasional palpitations.  She describes this as a flutter.  Past Medical History  Diagnosis Date  . ALLERGIC RHINITIS 03/02/2007  . BREAST CYST, LEFT 11/23/2009  . 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 CAD (coronary artery disease)     s/p DES to LAD 9/12  . HLD (hyperlipidemia)     Current Outpatient Prescriptions  Medication Sig Dispense Refill  . aspirin 81 MG tablet Take 81 mg by mouth daily.        . prasugrel (EFFIENT) 10 MG TABS Take 10 mg by mouth daily.        . rosuvastatin (CRESTOR) 20 MG tablet Take 20 mg by mouth daily.          Allergies: Allergies  Allergen Reactions  . Prednisone Palpitations    History  Substance Use Topics  . Smoking status: Never Smoker   . Smokeless tobacco: Never Used  . Alcohol Use: Yes     Occasional     ROS:  Please see the history of present illness.   She denies fevers, chills, melena, hematochezia, vomiting, diarrhea.  All other systems reviewed and negative.   PHYSICAL EXAM: VS:  BP 110/70  Pulse 66  Resp 18  Ht 5\' 4"  (1.626 m)  Wt 130 lb 12.8 oz (59.33 kg)  BMI 22.45 kg/m2 Well nourished, well developed, in no acute distress HEENT: normal Neck: no JVD Cardiac:  normal S1, S2; RRR; no murmur Lungs:  clear to auscultation bilaterally, no wheezing, rhonchi or rales Abd: soft, nontender, no hepatomegaly Ext: no edema; She has extensive ecchymosis noted at the right antecubital fossa extending down towards her wrist and a mild-to-moderate amount of ecchymosis at her left antecubital fossa.  She did not demonstrate any significant swelling. Vascular: Radial and ulnar pulses are intact on the right; no bruit noted over the right radial artery; she has good capillary refill Skin: warm and dry Neuro:  CNs 2-12 intact, no focal abnormalities noted  EKG:   Sinus rhythm, heart rate 66, normal axis, no ischemic changes   ASSESSMENT AND PLAN:

## 2011-05-06 NOTE — Telephone Encounter (Signed)
Pt has a follow up today with [facility]

## 2011-05-06 NOTE — Assessment & Plan Note (Signed)
I think her belching and atypical chest symptoms are likely related to acid reflux.  Start Protonix 40 mg QD.  Refer to GI.

## 2011-05-06 NOTE — Patient Instructions (Addendum)
Your physician has recommended you make the following change in your medication: START Protonix 40mg  take one by mouth daily  Your physician recommends that you have lab work today: BMP, CBC and Magnesium  Your physician has recommended that you wear an event monitor. Event monitors are medical devices that record the heart's electrical activity. Doctors most often [place] these monitors to diagnose arrhythmias. Arrhythmias are problems with the speed or rhythm of the heartbeat. The monitor is a small, portable device. You can wear one while you do your normal daily activities. This is usually used to diagnose what is causing palpitations/syncope (passing out).  You have been referred to GI for evaluation of Chest pain, reflux and bleching  Your physician recommends that you keep your scheduled follow-up appointment in Dr Jens Som.

## 2011-05-06 NOTE — Assessment & Plan Note (Signed)
Stable by recent cardiac catheterization at [facility].  Continue aspirin and Effient.  Followup with Dr. Olga Millers as scheduled.

## 2011-05-06 NOTE — Telephone Encounter (Signed)
ROI faxed to [facility], Records received back pt in Office, gave to [employer]  05/06/11/km

## 2011-05-06 NOTE — Assessment & Plan Note (Signed)
Continue crestor.  LDL at [facility] was 41.

## 2011-05-07 LAB — CBC WITH DIFFERENTIAL/PLATELET
Eosinophils Absolute: 0.3 10*3/uL (ref 0.0–0.7)
Hemoglobin: 13.7 g/dL (ref 12.0–15.0)
Lymphocytes Relative: 35 % (ref 12–46)
Lymphs Abs: 2.6 10*3/uL (ref 0.7–4.0)
MCH: 32.9 pg (ref 26.0–34.0)
Monocytes Relative: 10 % (ref 3–12)
Neutro Abs: 3.7 10*3/uL (ref 1.7–7.7)
Neutrophils Relative %: 50 % (ref 43–77)
Platelets: 326 10*3/uL (ref 150–400)
RBC: 4.16 MIL/uL (ref 3.87–5.11)
WBC: 7.5 10*3/uL (ref 4.0–10.5)

## 2011-05-07 LAB — BASIC METABOLIC PANEL
Chloride: 104 mEq/L (ref 96–112)
Creat: 0.61 mg/dL (ref 0.50–1.10)

## 2011-05-07 LAB — MAGNESIUM: Magnesium: 2 mg/dL (ref 1.5–2.5)

## 2011-05-07 LAB — URINALYSIS
Bilirubin Urine: NEGATIVE
Hgb urine dipstick: NEGATIVE
Ketones, ur: NEGATIVE mg/dL
Nitrite: NEGATIVE
Urobilinogen, UA: 0.2 mg/dL (ref 0.0–1.0)

## 2011-05-07 LAB — HM DEXA SCAN

## 2011-05-08 ENCOUNTER — Encounter (HOSPITAL_COMMUNITY): Payer: 59

## 2011-05-10 ENCOUNTER — Encounter (HOSPITAL_COMMUNITY)
Admission: RE | Admit: 2011-05-10 | Discharge: 2011-05-10 | Disposition: A | Discharge status: Home / Self Care | Payer: Self-pay | Source: Ambulatory Visit | Attending: Cardiology | Admitting: Cardiology

## 2011-05-10 ENCOUNTER — Encounter (HOSPITAL_COMMUNITY): Payer: 59

## 2011-05-10 ENCOUNTER — Ambulatory Visit: Payer: 59

## 2011-05-10 DIAGNOSIS — R079 Chest pain, unspecified: Secondary | ICD-10-CM | POA: Insufficient documentation

## 2011-05-10 DIAGNOSIS — Z5189 Encounter for other specified aftercare: Secondary | ICD-10-CM | POA: Insufficient documentation

## 2011-05-10 DIAGNOSIS — I200 Unstable angina: Secondary | ICD-10-CM | POA: Insufficient documentation

## 2011-05-10 DIAGNOSIS — E785 Hyperlipidemia, unspecified: Secondary | ICD-10-CM | POA: Insufficient documentation

## 2011-05-10 DIAGNOSIS — J45909 Unspecified asthma, uncomplicated: Secondary | ICD-10-CM | POA: Insufficient documentation

## 2011-05-10 DIAGNOSIS — I447 Left bundle-branch block, unspecified: Secondary | ICD-10-CM | POA: Insufficient documentation

## 2011-05-10 DIAGNOSIS — Z9861 Coronary angioplasty status: Secondary | ICD-10-CM | POA: Insufficient documentation

## 2011-05-10 DIAGNOSIS — I2510 Atherosclerotic heart disease of native coronary artery without angina pectoris: Secondary | ICD-10-CM | POA: Insufficient documentation

## 2011-05-10 DIAGNOSIS — R000 Tachycardia, unspecified: Secondary | ICD-10-CM | POA: Insufficient documentation

## 2011-05-10 NOTE — Progress Notes (Signed)
Pt oriented to the Cardiac Maintenance Rehab Program. Pt tolerated exercise well, did not use arms with exercise due to soreness from radial cath which she had on 12/24 at [facility]. Pt c/o "twinge" which she said was mild and fleeting. No other c/o, vital signs within normal limits.

## 2011-05-13 ENCOUNTER — Encounter (HOSPITAL_COMMUNITY): Payer: Self-pay

## 2011-05-13 ENCOUNTER — Encounter (HOSPITAL_COMMUNITY): Payer: 59

## 2011-05-14 NOTE — Progress Notes (Signed)
Cardiac Rehabilitation Program Progress Report   Orientation:  02/07/2011 Graduate Date:  04/26/2011 # of sessions completed: 27/36  Cardiologist: Jens Som Family MD:  Jim Desanctis Class Time:  1445  A.  Exercise Program:  Tolerates exercise @ 5.0 METS for 30 minutes, Bike Test Results:  Pre: 0.83 miles and Post: 0.94 miles, Improved functional capacity  13.3 %, Improved  muscular strength  30.4 %, Improved  flexibility 40.9 %, Progressed to Phase 4 maintenance program and Discharged to home exercise program.  Anticipated compliance:  excellent  B.  Mental Health:  Good mental attitude and Significant job stress Initial QOL scores: Overall 16.92, Health and Functioning 14.43, Socioeconomic 26.50, Psychological/Spiritual 12.07, Family 18.00  Pt works 60+ hours per week and travels for her job as well  C.  Education/Instruction/Skills  Accurately checks own pulse.  Rest:  71  Exercise:  133, Knows THR for exercise, Uses Perceived Exertion Scale and/or Dyspnea Scale and Attended 6/13 education classes  Home exercise given: 02/18/2012  D.  Nutrition/Weight Control/Body Composition:  BMI 23.2 % Body Fat  34.0, Patient has lost 1.0 kg and Evidence of fat weight loss 3.4%  *This section completed by Mickle Plumb, Andres Shad, RD, LDN, CDE  E.  Blood Lipids    Lab Results  Component Value Date   CHOL 162 04/08/2011     Lab Results  Component Value Date   TRIG 55.0 04/08/2011     Lab Results  Component Value Date   HDL 93.80 04/08/2011     Lab Results  Component Value Date   CHOLHDL 2 04/08/2011     Lab Results  Component Value Date   LDLDIRECT 101.2 11/15/2008      F.  Lifestyle Changes:  Making positive lifestyle changes  G.  Symptoms noted with exercise:  Asymptomatic  Report Completed By:  Hazle Nordmann   Comments:  Pt did well while in program progressing from 3.1 METs to 5.0 METs.  Pt has continued into the Maintenance program to continue to  exercise.  At discharge her rhythm was sinus.  Thanks for the referral. Fabio Pierce, MA, ACSM RCEP

## 2011-05-15 ENCOUNTER — Encounter (HOSPITAL_COMMUNITY): Payer: 59

## 2011-05-15 ENCOUNTER — Other Ambulatory Visit: Payer: 59 | Admitting: Cardiology

## 2011-05-15 ENCOUNTER — Encounter (HOSPITAL_COMMUNITY)
Admission: RE | Admit: 2011-05-15 | Discharge: 2011-05-15 | Disposition: A | Discharge status: Home / Self Care | Payer: Self-pay | Source: Ambulatory Visit | Attending: Cardiology | Admitting: Cardiology

## 2011-05-15 NOTE — Progress Notes (Signed)
Cardiac Rehabilitation Program Progress Report   Orientation:  02/07/2011 Graduate Date:  04/26/2011 # of sessions completed: 27/36  Cardiologist: Jens Som Family MD:  Jim Desanctis Class Time:  1445  A.  Exercise Program:  Tolerates exercise @ 5.0 METS for 30 minutes, Bike Test Results:  Pre: 0.83 miles and Post: 0.94 miles, Improved functional capacity  13.3 %, Improved  muscular strength  30.4 %, Improved  flexibility 40.9 %, Progressed to Phase 4 maintenance program and Discharged to home exercise program.  Anticipated compliance:  excellent  B.  Mental Health:  Good mental attitude and Significant job stress Initial QOL scores: Overall 16.92, Health and Functioning 14.43, Socioeconomic 26.50, Psychological/Spiritual 12.07, Family 18.00  Pt works 60+ hours per week and travels for her job as well  C.  Education/Instruction/Skills  Accurately checks own pulse.  Rest:  71  Exercise:  133, Knows THR for exercise, Uses Perceived Exertion Scale and/or Dyspnea Scale and Attended 6/13 education classes  Home exercise given: 02/18/2012  D.  Nutrition/Weight Control/Body Composition:       Pt following a step 2 heart healthy diet, BMI 23.2 % Body Fat  34.0, Patient has lost 1.0 kg and Evidence of fat weight loss 3.4%  *This section completed by Mickle Plumb, Andres Shad, RD, LDN, CDE  E.  Blood Lipids Lab Results  Component Value Date   CHOL 162 04/08/2011   HDL 93.80 04/08/2011   LDLCALC 57 04/08/2011   LDLDIRECT 101.2 11/15/2008   TRIG 55.0 04/08/2011   CHOLHDL 2 04/08/2011   F.  Lifestyle Changes:  Making positive lifestyle changes  G.  Symptoms noted with exercise:  Asymptomatic  Report Completed By:  Jacques Earthly   Comments:  Pt did well while in program progressing from 3.1 METs to 5.0 METs.  Pt has continued into the Maintenance program to continue to exercise.  At discharge her rhythm was sinus.  Thanks for the referral. Fabio Pierce, MA, ACSM  RCEP

## 2011-05-16 ENCOUNTER — Encounter: Payer: Self-pay | Admitting: Cardiology

## 2011-05-16 ENCOUNTER — Ambulatory Visit (INDEPENDENT_AMBULATORY_CARE_PROVIDER_SITE_OTHER): Payer: 59 | Admitting: Cardiology

## 2011-05-16 VITALS — BP 118/80 | HR 88 | Ht 64.0 in | Wt 129.0 lb

## 2011-05-16 DIAGNOSIS — M79603 Pain in arm, unspecified: Secondary | ICD-10-CM

## 2011-05-16 DIAGNOSIS — R002 Palpitations: Secondary | ICD-10-CM

## 2011-05-16 DIAGNOSIS — I739 Peripheral vascular disease, unspecified: Secondary | ICD-10-CM | POA: Insufficient documentation

## 2011-05-16 DIAGNOSIS — E785 Hyperlipidemia, unspecified: Secondary | ICD-10-CM

## 2011-05-16 DIAGNOSIS — M79609 Pain in unspecified limb: Secondary | ICD-10-CM

## 2011-05-16 NOTE — Progress Notes (Signed)
HPI: Pleasant female for followup of coronary disease. Recently seen for chest pain. Stress echo was markedly abnormal with nonsustained ventricular tachycardia and ischemia in the anteroseptal wall and apex. Patient underwent cardiac catheterization in September of 2012 which revealed a 95% ostial LAD lesion. The patient had PCI with a drug-eluting stent. There was some jailing of the circumflex. There was recommendation for aspirin indefinitely and effient for at least one year and possibly indefinitely.  Repeat cath at [facility] in Dec 2012 because of palpitations and chest pain revealed patency of LAD stent and she had no other significant CAD (Proximal RCA 10%, left main 20%, small OM1 50%, mid LAD 20%, D2 20%). Patient with significant radial spasm during the procedure. She was given Toprol-XL 25 mg daily at discharge. TSH was 2.27. Had event monitor placed at [facility] by Tereso Newcomer. Preliminary shows sinus rhythm. Carotid Dopplers December 2012 showed 0-39% bilateral stenosis. Radial Doppler in December of 2012 showed probable occlusion of the right radial but patent ulnar. Followup recommended in 2 weeks. Since she was last seen, she can feel an occasional brief pain in various locations in her chest and arm. It lasts one second and resolves spontaneously. She denies dyspnea on exertion, orthopnea, PND, pedal edema or syncope. She occasionally feels a brief flutter but no sustained palpitations. Pain in her right upper extremity has improved.   Current Outpatient Prescriptions  Medication Sig Dispense Refill  . aspirin 81 MG tablet Take 81 mg by mouth daily.        . pantoprazole (PROTONIX) 40 MG tablet Take 1 tablet (40 mg total) by mouth daily.  90 tablet  3  . prasugrel (EFFIENT) 10 MG TABS Take 10 mg by mouth daily.        . rosuvastatin (CRESTOR) 20 MG tablet Take 20 mg by mouth daily.           Past Medical History  Diagnosis Date  . ALLERGIC RHINITIS 03/02/2007  . BREAST CYST, LEFT  11/23/2009  . 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 CAD (coronary artery disease)     s/p DES to LAD 9/12  . HLD (hyperlipidemia)     Past Surgical History  Procedure Date  . Breast surgery     BILAT IMPLANTS  . Breast biopsy     History   Social History  . Marital Status: Married    Spouse Name: N/A    Number of Children: N/A  . 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 Smoking status: Never Smoker   . Smokeless tobacco: Never Used  . Alcohol Use: Yes     Occasional  . Drug Use: No  . Sexually Active: Not on file   Other Topics Concern  . Not on file   Social History Narrative   Occupation: Never SmokedAlcohol use- noMarriedDrug use- noRegular Exercise- yes     ROS: no fevers or chills, productive cough, hemoptysis, dysphasia, odynophagia, melena, hematochezia, dysuria, hematuria, rash, seizure activity, orthopnea, PND, pedal edema, claudication. Remaining systems are negative.  Physical Exam: Well-developed well-nourished in no acute distress.  Skin is warm and dry.  HEENT is normal.  Neck is supple. No thyromegaly.  Chest is clear to auscultation with normal expansion.  Cardiovascular exam is regular rate and rhythm.  Abdominal exam nontender or distended. No masses palpated. Extremities show no edema. Right radial pulse not palpable. Right ulnar  2+. Left radial 2+. neuro grossly intact

## 2011-05-16 NOTE — Assessment & Plan Note (Signed)
Recent Dopplers showed occlusion of right radial. Repeat study.

## 2011-05-16 NOTE — Assessment & Plan Note (Signed)
Continue aspirin, prasugrel, and statin. Recent catheterization showed patent LAD stent.

## 2011-05-16 NOTE — Assessment & Plan Note (Signed)
Palpitations have improved. Monitor thus far shows no significant arrhythmia. I considered adding Toprol but her systolic blood pressure runs approximately 90. Continue to observe.

## 2011-05-16 NOTE — Patient Instructions (Signed)
Your physician wants you to follow-up in: 6 months You will receive a reminder letter in the mail two months in advance. If you don't receive a letter, please call our office to schedule the follow-up appointment.   Your physician has requested that you have a upper extremity arterial duplex. This test is an ultrasound of the arteries in the legs or arms. It looks at arterial blood flow in the legs and arms. Allow one hour for Lower and Upper Arterial scans. There are no restrictions or special instructions

## 2011-05-16 NOTE — Assessment & Plan Note (Signed)
Continue statin. 

## 2011-05-17 ENCOUNTER — Encounter (HOSPITAL_COMMUNITY): Payer: 59

## 2011-05-17 ENCOUNTER — Encounter (HOSPITAL_COMMUNITY): Payer: Self-pay

## 2011-05-20 ENCOUNTER — Ambulatory Visit
Admission: RE | Admit: 2011-05-20 | Discharge: 2011-05-20 | Disposition: A | Discharge status: Home / Self Care | Payer: 59 | Source: Ambulatory Visit | Attending: Family Medicine | Admitting: Family Medicine

## 2011-05-20 ENCOUNTER — Ambulatory Visit (HOSPITAL_COMMUNITY): Payer: 59

## 2011-05-20 ENCOUNTER — Encounter (HOSPITAL_COMMUNITY)
Admission: RE | Admit: 2011-05-20 | Discharge: 2011-05-20 | Disposition: A | Discharge status: Home / Self Care | Payer: Self-pay | Source: Ambulatory Visit | Attending: Cardiology | Admitting: Cardiology

## 2011-05-20 DIAGNOSIS — Z1231 Encounter for screening mammogram for malignant neoplasm of breast: Secondary | ICD-10-CM

## 2011-05-22 ENCOUNTER — Encounter (HOSPITAL_COMMUNITY): Payer: Self-pay

## 2011-05-22 ENCOUNTER — Ambulatory Visit (HOSPITAL_COMMUNITY): Payer: 59

## 2011-05-24 ENCOUNTER — Encounter (HOSPITAL_COMMUNITY): Payer: Self-pay

## 2011-05-24 ENCOUNTER — Ambulatory Visit (HOSPITAL_COMMUNITY): Payer: 59

## 2011-05-27 ENCOUNTER — Ambulatory Visit (HOSPITAL_COMMUNITY): Payer: 59

## 2011-05-27 ENCOUNTER — Encounter (HOSPITAL_COMMUNITY)
Admission: RE | Admit: 2011-05-27 | Discharge: 2011-05-27 | Disposition: A | Discharge status: Home / Self Care | Payer: Self-pay | Source: Ambulatory Visit | Attending: Cardiology | Admitting: Cardiology

## 2011-05-29 ENCOUNTER — Ambulatory Visit (HOSPITAL_COMMUNITY): Payer: 59

## 2011-05-29 ENCOUNTER — Encounter (HOSPITAL_COMMUNITY)
Admission: RE | Admit: 2011-05-29 | Discharge: 2011-05-29 | Disposition: A | Discharge status: Home / Self Care | Payer: Self-pay | Source: Ambulatory Visit | Attending: Cardiology | Admitting: Cardiology

## 2011-05-30 ENCOUNTER — Telehealth: Payer: Self-pay | Admitting: Cardiology

## 2011-05-30 NOTE — Telephone Encounter (Signed)
Pt Called asking For Records,she will pick these up Monday  05/30/11/KM

## 2011-05-31 ENCOUNTER — Encounter (HOSPITAL_COMMUNITY): Payer: Self-pay

## 2011-05-31 ENCOUNTER — Ambulatory Visit (HOSPITAL_COMMUNITY): Payer: 59

## 2011-06-03 ENCOUNTER — Ambulatory Visit (HOSPITAL_COMMUNITY): Payer: 59

## 2011-06-03 ENCOUNTER — Other Ambulatory Visit: Payer: 59 | Admitting: Cardiology

## 2011-06-03 ENCOUNTER — Encounter (HOSPITAL_COMMUNITY)
Admission: RE | Admit: 2011-06-03 | Discharge: 2011-06-03 | Disposition: A | Discharge status: Home / Self Care | Payer: Self-pay | Source: Ambulatory Visit | Attending: Cardiology | Admitting: Cardiology

## 2011-06-03 ENCOUNTER — Other Ambulatory Visit (INDEPENDENT_AMBULATORY_CARE_PROVIDER_SITE_OTHER): Payer: 59 | Admitting: Cardiology

## 2011-06-03 DIAGNOSIS — I7092 Chronic total occlusion of artery of the extremities: Secondary | ICD-10-CM

## 2011-06-03 DIAGNOSIS — M79603 Pain in arm, unspecified: Secondary | ICD-10-CM

## 2011-06-03 NOTE — Telephone Encounter (Signed)
Patient signed a release and picked up records Mon. 06/03/2011. EMG

## 2011-06-05 ENCOUNTER — Ambulatory Visit (HOSPITAL_COMMUNITY): Payer: 59

## 2011-06-05 ENCOUNTER — Encounter (HOSPITAL_COMMUNITY): Payer: Self-pay

## 2011-06-07 ENCOUNTER — Encounter (HOSPITAL_COMMUNITY)
Admission: RE | Admit: 2011-06-07 | Discharge: 2011-06-07 | Disposition: A | Discharge status: Home / Self Care | Payer: Self-pay | Source: Ambulatory Visit | Attending: Cardiology | Admitting: Cardiology

## 2011-06-07 ENCOUNTER — Ambulatory Visit (HOSPITAL_COMMUNITY): Payer: 59

## 2011-06-07 DIAGNOSIS — I2510 Atherosclerotic heart disease of native coronary artery without angina pectoris: Secondary | ICD-10-CM | POA: Insufficient documentation

## 2011-06-07 DIAGNOSIS — R079 Chest pain, unspecified: Secondary | ICD-10-CM | POA: Insufficient documentation

## 2011-06-07 DIAGNOSIS — Z9861 Coronary angioplasty status: Secondary | ICD-10-CM | POA: Insufficient documentation

## 2011-06-07 DIAGNOSIS — I447 Left bundle-branch block, unspecified: Secondary | ICD-10-CM | POA: Insufficient documentation

## 2011-06-07 DIAGNOSIS — E785 Hyperlipidemia, unspecified: Secondary | ICD-10-CM | POA: Insufficient documentation

## 2011-06-07 DIAGNOSIS — Z5189 Encounter for other specified aftercare: Secondary | ICD-10-CM | POA: Insufficient documentation

## 2011-06-07 DIAGNOSIS — I200 Unstable angina: Secondary | ICD-10-CM | POA: Insufficient documentation

## 2011-06-07 DIAGNOSIS — R000 Tachycardia, unspecified: Secondary | ICD-10-CM | POA: Insufficient documentation

## 2011-06-07 DIAGNOSIS — J45909 Unspecified asthma, uncomplicated: Secondary | ICD-10-CM | POA: Insufficient documentation

## 2011-06-10 ENCOUNTER — Ambulatory Visit (HOSPITAL_COMMUNITY): Payer: 59

## 2011-06-10 ENCOUNTER — Encounter (HOSPITAL_COMMUNITY)
Admission: RE | Admit: 2011-06-10 | Discharge: 2011-06-10 | Disposition: A | Discharge status: Home / Self Care | Payer: Self-pay | Source: Ambulatory Visit | Attending: Cardiology | Admitting: Cardiology

## 2011-06-12 ENCOUNTER — Ambulatory Visit (HOSPITAL_COMMUNITY): Payer: 59

## 2011-06-12 ENCOUNTER — Encounter (HOSPITAL_COMMUNITY)
Admission: RE | Admit: 2011-06-12 | Discharge: 2011-06-12 | Disposition: A | Discharge status: Home / Self Care | Payer: Self-pay | Source: Ambulatory Visit | Attending: Cardiology | Admitting: Cardiology

## 2011-06-14 ENCOUNTER — Ambulatory Visit (HOSPITAL_COMMUNITY): Payer: 59

## 2011-06-14 ENCOUNTER — Encounter (HOSPITAL_COMMUNITY): Payer: Self-pay

## 2011-06-17 ENCOUNTER — Encounter (HOSPITAL_COMMUNITY): Payer: Self-pay

## 2011-06-19 ENCOUNTER — Encounter (HOSPITAL_COMMUNITY): Payer: Self-pay | Admitting: *Deleted

## 2011-06-19 ENCOUNTER — Encounter (HOSPITAL_COMMUNITY): Payer: Self-pay

## 2011-06-19 NOTE — Progress Notes (Signed)
Agree with progress report from cardiac rehab on 05/14/2011 and 05/15/2011 written by Trevor Mace and and Rene Paci is continuing exercise in the maintenance program,

## 2011-06-21 ENCOUNTER — Encounter (HOSPITAL_COMMUNITY): Payer: Self-pay

## 2011-06-24 ENCOUNTER — Encounter (HOSPITAL_COMMUNITY)
Admission: RE | Admit: 2011-06-24 | Discharge: 2011-06-24 | Disposition: A | Discharge status: Home / Self Care | Payer: Self-pay | Source: Ambulatory Visit | Attending: Cardiology | Admitting: Cardiology

## 2011-06-26 ENCOUNTER — Encounter (HOSPITAL_COMMUNITY)
Admission: RE | Admit: 2011-06-26 | Discharge: 2011-06-26 | Disposition: A | Discharge status: Home / Self Care | Payer: Self-pay | Source: Ambulatory Visit | Attending: Cardiology | Admitting: Cardiology

## 2011-06-28 ENCOUNTER — Encounter (HOSPITAL_COMMUNITY): Payer: Self-pay

## 2011-07-01 ENCOUNTER — Encounter (HOSPITAL_COMMUNITY)
Admission: RE | Admit: 2011-07-01 | Discharge: 2011-07-01 | Disposition: A | Discharge status: Home / Self Care | Payer: Self-pay | Source: Ambulatory Visit | Attending: Cardiology | Admitting: Cardiology

## 2011-07-03 ENCOUNTER — Encounter (HOSPITAL_COMMUNITY)
Admission: RE | Admit: 2011-07-03 | Discharge: 2011-07-03 | Disposition: A | Discharge status: Home / Self Care | Payer: Self-pay | Source: Ambulatory Visit | Attending: Cardiology | Admitting: Cardiology

## 2011-07-05 ENCOUNTER — Encounter (HOSPITAL_COMMUNITY)
Admission: RE | Admit: 2011-07-05 | Discharge: 2011-07-05 | Disposition: A | Discharge status: Home / Self Care | Payer: Self-pay | Source: Ambulatory Visit | Attending: Cardiology | Admitting: Cardiology

## 2011-07-05 DIAGNOSIS — E785 Hyperlipidemia, unspecified: Secondary | ICD-10-CM | POA: Insufficient documentation

## 2011-07-05 DIAGNOSIS — I2510 Atherosclerotic heart disease of native coronary artery without angina pectoris: Secondary | ICD-10-CM | POA: Insufficient documentation

## 2011-07-05 DIAGNOSIS — I447 Left bundle-branch block, unspecified: Secondary | ICD-10-CM | POA: Insufficient documentation

## 2011-07-05 DIAGNOSIS — R000 Tachycardia, unspecified: Secondary | ICD-10-CM | POA: Insufficient documentation

## 2011-07-05 DIAGNOSIS — Z9861 Coronary angioplasty status: Secondary | ICD-10-CM | POA: Insufficient documentation

## 2011-07-05 DIAGNOSIS — R079 Chest pain, unspecified: Secondary | ICD-10-CM | POA: Insufficient documentation

## 2011-07-05 DIAGNOSIS — J45909 Unspecified asthma, uncomplicated: Secondary | ICD-10-CM | POA: Insufficient documentation

## 2011-07-05 DIAGNOSIS — I200 Unstable angina: Secondary | ICD-10-CM | POA: Insufficient documentation

## 2011-07-05 DIAGNOSIS — Z5189 Encounter for other specified aftercare: Secondary | ICD-10-CM | POA: Insufficient documentation

## 2011-07-08 ENCOUNTER — Encounter (HOSPITAL_COMMUNITY)
Admission: RE | Admit: 2011-07-08 | Discharge: 2011-07-08 | Disposition: A | Discharge status: Home / Self Care | Payer: Self-pay | Source: Ambulatory Visit | Attending: Cardiology | Admitting: Cardiology

## 2011-07-10 ENCOUNTER — Encounter (HOSPITAL_COMMUNITY): Payer: Self-pay

## 2011-07-12 ENCOUNTER — Encounter (HOSPITAL_COMMUNITY)
Admission: RE | Admit: 2011-07-12 | Discharge: 2011-07-12 | Disposition: A | Discharge status: Home / Self Care | Payer: Self-pay | Source: Ambulatory Visit | Attending: Cardiology | Admitting: Cardiology

## 2011-07-15 ENCOUNTER — Encounter (HOSPITAL_COMMUNITY)
Admission: RE | Admit: 2011-07-15 | Discharge: 2011-07-15 | Disposition: A | Discharge status: Home / Self Care | Payer: Self-pay | Source: Ambulatory Visit | Attending: Cardiology | Admitting: Cardiology

## 2011-07-17 ENCOUNTER — Encounter (HOSPITAL_COMMUNITY): Payer: Self-pay

## 2011-07-19 ENCOUNTER — Encounter (HOSPITAL_COMMUNITY)
Admission: RE | Admit: 2011-07-19 | Discharge: 2011-07-19 | Disposition: A | Discharge status: Home / Self Care | Payer: Self-pay | Source: Ambulatory Visit | Attending: Cardiology | Admitting: Cardiology

## 2011-07-22 ENCOUNTER — Encounter (HOSPITAL_COMMUNITY)
Admission: RE | Admit: 2011-07-22 | Discharge: 2011-07-22 | Disposition: A | Discharge status: Home / Self Care | Payer: Self-pay | Source: Ambulatory Visit | Attending: Cardiology | Admitting: Cardiology

## 2011-07-24 ENCOUNTER — Encounter (HOSPITAL_COMMUNITY): Payer: Self-pay

## 2011-07-26 ENCOUNTER — Encounter (HOSPITAL_COMMUNITY)
Admission: RE | Admit: 2011-07-26 | Discharge: 2011-07-26 | Disposition: A | Discharge status: Home / Self Care | Payer: Self-pay | Source: Ambulatory Visit | Attending: Cardiology | Admitting: Cardiology

## 2011-07-29 ENCOUNTER — Encounter (HOSPITAL_COMMUNITY)
Admission: RE | Admit: 2011-07-29 | Discharge: 2011-07-29 | Disposition: A | Discharge status: Home / Self Care | Payer: Self-pay | Source: Ambulatory Visit | Attending: Cardiology | Admitting: Cardiology

## 2011-07-31 ENCOUNTER — Encounter (HOSPITAL_COMMUNITY): Payer: Self-pay

## 2011-08-02 ENCOUNTER — Encounter (HOSPITAL_COMMUNITY)
Admission: RE | Admit: 2011-08-02 | Discharge: 2011-08-02 | Disposition: A | Discharge status: Home / Self Care | Payer: Self-pay | Source: Ambulatory Visit | Attending: Cardiology | Admitting: Cardiology

## 2011-08-05 ENCOUNTER — Telehealth: Payer: Self-pay | Admitting: Cardiology

## 2011-08-05 ENCOUNTER — Encounter (HOSPITAL_COMMUNITY): Payer: Self-pay

## 2011-08-05 DIAGNOSIS — R000 Tachycardia, unspecified: Secondary | ICD-10-CM | POA: Insufficient documentation

## 2011-08-05 DIAGNOSIS — I447 Left bundle-branch block, unspecified: Secondary | ICD-10-CM | POA: Insufficient documentation

## 2011-08-05 DIAGNOSIS — Z5189 Encounter for other specified aftercare: Secondary | ICD-10-CM | POA: Insufficient documentation

## 2011-08-05 DIAGNOSIS — Z9861 Coronary angioplasty status: Secondary | ICD-10-CM | POA: Insufficient documentation

## 2011-08-05 DIAGNOSIS — I200 Unstable angina: Secondary | ICD-10-CM | POA: Insufficient documentation

## 2011-08-05 DIAGNOSIS — J45909 Unspecified asthma, uncomplicated: Secondary | ICD-10-CM | POA: Insufficient documentation

## 2011-08-05 DIAGNOSIS — R079 Chest pain, unspecified: Secondary | ICD-10-CM | POA: Insufficient documentation

## 2011-08-05 DIAGNOSIS — I2510 Atherosclerotic heart disease of native coronary artery without angina pectoris: Secondary | ICD-10-CM | POA: Insufficient documentation

## 2011-08-05 DIAGNOSIS — E785 Hyperlipidemia, unspecified: Secondary | ICD-10-CM | POA: Insufficient documentation

## 2011-08-05 NOTE — Telephone Encounter (Signed)
Fu call °Pt returning your call  °

## 2011-08-05 NOTE — Telephone Encounter (Signed)
Spoke with pt, she is going to have a routine cleaning and wanted to make sure okay. The dentist is aware she is taking effient.

## 2011-08-05 NOTE — Telephone Encounter (Signed)
Pt has a dental appt this Wednesday and needs a note stating she can have dental work done

## 2011-08-07 ENCOUNTER — Encounter (HOSPITAL_COMMUNITY)
Admission: RE | Admit: 2011-08-07 | Discharge: 2011-08-07 | Disposition: A | Discharge status: Home / Self Care | Payer: Self-pay | Source: Ambulatory Visit | Attending: Cardiology | Admitting: Cardiology

## 2011-08-09 ENCOUNTER — Encounter (HOSPITAL_COMMUNITY): Payer: Self-pay

## 2011-08-12 ENCOUNTER — Encounter (HOSPITAL_COMMUNITY)
Admission: RE | Admit: 2011-08-12 | Discharge: 2011-08-12 | Disposition: A | Discharge status: Home / Self Care | Payer: Self-pay | Source: Ambulatory Visit | Attending: Cardiology | Admitting: Cardiology

## 2011-08-14 ENCOUNTER — Encounter (HOSPITAL_COMMUNITY): Payer: Self-pay

## 2011-08-16 ENCOUNTER — Encounter (HOSPITAL_COMMUNITY): Payer: Self-pay

## 2011-08-19 ENCOUNTER — Encounter (HOSPITAL_COMMUNITY): Payer: Self-pay

## 2011-08-21 ENCOUNTER — Encounter (HOSPITAL_COMMUNITY): Payer: Self-pay

## 2011-08-23 ENCOUNTER — Encounter (HOSPITAL_COMMUNITY)
Admission: RE | Admit: 2011-08-23 | Discharge: 2011-08-23 | Disposition: A | Discharge status: Home / Self Care | Payer: Self-pay | Source: Ambulatory Visit | Attending: Cardiology | Admitting: Cardiology

## 2011-08-26 ENCOUNTER — Encounter (HOSPITAL_COMMUNITY)
Admission: RE | Admit: 2011-08-26 | Discharge: 2011-08-26 | Disposition: A | Discharge status: Home / Self Care | Payer: Self-pay | Source: Ambulatory Visit | Attending: Cardiology | Admitting: Cardiology

## 2011-08-28 ENCOUNTER — Encounter (HOSPITAL_COMMUNITY)
Admission: RE | Admit: 2011-08-28 | Discharge: 2011-08-28 | Disposition: A | Discharge status: Home / Self Care | Payer: Self-pay | Source: Ambulatory Visit | Attending: Cardiology | Admitting: Cardiology

## 2011-08-30 ENCOUNTER — Encounter (HOSPITAL_COMMUNITY): Payer: Self-pay

## 2011-09-02 ENCOUNTER — Encounter (HOSPITAL_COMMUNITY)
Admission: RE | Admit: 2011-09-02 | Discharge: 2011-09-02 | Disposition: A | Discharge status: Home / Self Care | Payer: Self-pay | Source: Ambulatory Visit | Attending: Cardiology | Admitting: Cardiology

## 2011-09-06 ENCOUNTER — Ambulatory Visit (INDEPENDENT_AMBULATORY_CARE_PROVIDER_SITE_OTHER): Payer: 59 | Admitting: Cardiology

## 2011-09-06 ENCOUNTER — Encounter: Payer: Self-pay | Admitting: Cardiology

## 2011-09-06 ENCOUNTER — Encounter (HOSPITAL_COMMUNITY)
Admission: RE | Admit: 2011-09-06 | Discharge: 2011-09-06 | Disposition: A | Discharge status: Home / Self Care | Payer: Self-pay | Source: Ambulatory Visit | Attending: Cardiology | Admitting: Cardiology

## 2011-09-06 VITALS — BP 114/76 | HR 68 | Ht 64.0 in | Wt 133.0 lb

## 2011-09-06 DIAGNOSIS — R079 Chest pain, unspecified: Secondary | ICD-10-CM | POA: Insufficient documentation

## 2011-09-06 DIAGNOSIS — I2510 Atherosclerotic heart disease of native coronary artery without angina pectoris: Secondary | ICD-10-CM

## 2011-09-06 DIAGNOSIS — I200 Unstable angina: Secondary | ICD-10-CM | POA: Insufficient documentation

## 2011-09-06 DIAGNOSIS — R000 Tachycardia, unspecified: Secondary | ICD-10-CM | POA: Insufficient documentation

## 2011-09-06 DIAGNOSIS — I447 Left bundle-branch block, unspecified: Secondary | ICD-10-CM | POA: Insufficient documentation

## 2011-09-06 DIAGNOSIS — Z9861 Coronary angioplasty status: Secondary | ICD-10-CM | POA: Insufficient documentation

## 2011-09-06 DIAGNOSIS — J45909 Unspecified asthma, uncomplicated: Secondary | ICD-10-CM | POA: Insufficient documentation

## 2011-09-06 DIAGNOSIS — Z5189 Encounter for other specified aftercare: Secondary | ICD-10-CM | POA: Insufficient documentation

## 2011-09-06 DIAGNOSIS — E785 Hyperlipidemia, unspecified: Secondary | ICD-10-CM | POA: Insufficient documentation

## 2011-09-06 LAB — HEPATIC FUNCTION PANEL
Alkaline Phosphatase: 47 U/L (ref 39–117)
Bilirubin, Direct: 0 mg/dL (ref 0.0–0.3)
Total Bilirubin: 0.9 mg/dL (ref 0.3–1.2)

## 2011-09-06 LAB — BASIC METABOLIC PANEL
BUN: 12 mg/dL (ref 6–23)
Chloride: 105 mEq/L (ref 96–112)
Creatinine, Ser: 0.5 mg/dL (ref 0.4–1.2)
Glucose, Bld: 78 mg/dL (ref 70–99)
Potassium: 3.7 mEq/L (ref 3.5–5.1)

## 2011-09-06 LAB — LIPID PANEL
LDL Cholesterol: 35 mg/dL (ref 0–99)
VLDL: 12.4 mg/dL (ref 0.0–40.0)

## 2011-09-06 NOTE — Patient Instructions (Signed)
Your physician wants you to follow-up in: 6 MONTHS You will receive a reminder letter in the mail two months in advance. If you don't receive a letter, please call our office to schedule the follow-up appointment. 

## 2011-09-06 NOTE — Assessment & Plan Note (Signed)
Continue statin. Check lipids, liver and renal function. 

## 2011-09-06 NOTE — Assessment & Plan Note (Signed)
These are not particularly problematic at this point. Her blood pressure runs low. We will not add a beta blocker at this point but can consider low dose in the future if the palpitations worsen.

## 2011-09-06 NOTE — Assessment & Plan Note (Signed)
Continue present medications. 

## 2011-09-06 NOTE — Progress Notes (Signed)
   HPI: Pleasant female for followup of coronary disease. Recently seen for chest pain. Stress echo was markedly abnormal with nonsustained ventricular tachycardia and ischemia in the anteroseptal wall and apex. Patient underwent cardiac catheterization in September of 2012 which revealed a 95% ostial LAD lesion. The patient had PCI with a drug-eluting stent. There was some jailing of the circumflex. There was recommendation for aspirin indefinitely and effient for at least one year and possibly indefinitely.  Repeat cath at [facility] in Dec 2012 because of palpitations and chest pain revealed patency of LAD stent and she had no other significant CAD (Proximal RCA 10%, left main 20%, small OM1 50%, mid LAD 20%, D2 20%). Patient with significant radial spasm during the procedure. She was given Toprol-XL 25 mg daily at discharge. TSH was 2.27. Had event monitor placed in Jan 2013 that showed sinus rhythm. Carotid Dopplers December 2012 showed 0-39% bilateral stenosis. 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 Since she was last seen in Jan 2013, she denies dyspnea on exertion, orthopnea, PND, pedal edema or syncope. She occasionally feels a brief flutter but no sustained palpitations. Occasional brief ache in her chest for one to 2 seconds unlike her previous symptoms. No exertional chest pain.  Current Outpatient Prescriptions  Medication Sig Dispense Refill  . aspirin 81 MG tablet Take 81 mg by mouth daily.        . prasugrel (EFFIENT) 10 MG TABS Take 10 mg by mouth daily.        . rosuvastatin (CRESTOR) 20 MG tablet Take 20 mg by mouth daily.           Past Medical History  Diagnosis Date  . ALLERGIC RHINITIS 03/02/2007  . BREAST CYST, LEFT 11/23/2009  . 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 CAD (coronary artery disease)     s/p DES to LAD 9/12  . HLD (hyperlipidemia)     Past Surgical History  Procedure Date  . Breast surgery     BILAT  IMPLANTS  . Breast biopsy     History   Social History  . Marital Status: Married    Spouse Name: N/A    Number of Children: N/A  . 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 Smoking status: Never Smoker   . Smokeless tobacco: Never Used  . Alcohol Use: Yes     Occasional  . Drug Use: No  . Sexually Active: Not on file   Other Topics Concern  . Not on file   Social History Narrative   Occupation: Never SmokedAlcohol use- noMarriedDrug use- noRegular Exercise- yes     ROS: no fevers or chills, productive cough, hemoptysis, dysphasia, odynophagia, melena, hematochezia, dysuria, hematuria, rash, seizure activity, orthopnea, PND, pedal edema, claudication. Remaining systems are negative.  Physical Exam: Well-developed well-nourished in no acute distress.  Skin is warm and dry.  HEENT is normal.  Neck is supple. No thyromegaly.  Chest is clear to auscultation with normal expansion.  Cardiovascular exam is regular rate and rhythm.  Abdominal exam nontender or distended. No masses palpated. Extremities show no edema. neuro grossly intact  ECG sinus rhythm with occasional PVCs. No ST changes.

## 2011-09-10 ENCOUNTER — Encounter: Payer: Self-pay | Admitting: *Deleted

## 2011-09-11 ENCOUNTER — Encounter (HOSPITAL_COMMUNITY)
Admission: RE | Admit: 2011-09-11 | Discharge: 2011-09-11 | Disposition: A | Discharge status: Home / Self Care | Payer: Self-pay | Source: Ambulatory Visit | Attending: Cardiology | Admitting: Cardiology

## 2011-09-13 ENCOUNTER — Telehealth: Payer: Self-pay | Admitting: Cardiology

## 2011-09-13 ENCOUNTER — Encounter (HOSPITAL_COMMUNITY)
Admission: RE | Admit: 2011-09-13 | Discharge: 2011-09-13 | Disposition: A | Discharge status: Home / Self Care | Payer: Self-pay | Source: Ambulatory Visit | Attending: Cardiology | Admitting: Cardiology

## 2011-09-13 NOTE — Telephone Encounter (Signed)
Left message for pt copy of labs were mailed to her. She will call back if she did not receive.

## 2011-09-13 NOTE — Telephone Encounter (Signed)
New msg Pt was calling about labs results

## 2011-09-16 ENCOUNTER — Encounter (HOSPITAL_COMMUNITY)
Admission: RE | Admit: 2011-09-16 | Discharge: 2011-09-16 | Disposition: A | Discharge status: Home / Self Care | Payer: Self-pay | Source: Ambulatory Visit | Attending: Cardiology | Admitting: Cardiology

## 2011-09-16 ENCOUNTER — Telehealth: Payer: Self-pay | Admitting: Cardiology

## 2011-09-16 NOTE — Telephone Encounter (Signed)
New Problem:     Patient called in because she did not receive all of her lab results in the mail, she only received her cholesterolol levels.  Please call back.

## 2011-09-16 NOTE — Telephone Encounter (Signed)
Spoke with pt, copy of labs placed at the front desk for pt to pick up.

## 2011-09-18 ENCOUNTER — Encounter (HOSPITAL_COMMUNITY): Payer: Self-pay

## 2011-09-20 ENCOUNTER — Encounter (HOSPITAL_COMMUNITY): Payer: Self-pay

## 2011-09-23 ENCOUNTER — Encounter (HOSPITAL_COMMUNITY)
Admission: RE | Admit: 2011-09-23 | Discharge: 2011-09-23 | Disposition: A | Discharge status: Home / Self Care | Payer: Self-pay | Source: Ambulatory Visit | Attending: Cardiology | Admitting: Cardiology

## 2011-09-25 ENCOUNTER — Encounter (HOSPITAL_COMMUNITY): Payer: Self-pay

## 2011-09-27 ENCOUNTER — Encounter (HOSPITAL_COMMUNITY)
Admission: RE | Admit: 2011-09-27 | Discharge: 2011-09-27 | Disposition: A | Discharge status: Home / Self Care | Payer: Self-pay | Source: Ambulatory Visit | Attending: Cardiology | Admitting: Cardiology

## 2011-10-02 ENCOUNTER — Encounter (HOSPITAL_COMMUNITY): Payer: Self-pay

## 2011-10-03 ENCOUNTER — Other Ambulatory Visit: Payer: Self-pay | Admitting: Obstetrics & Gynecology

## 2011-10-03 DIAGNOSIS — N950 Postmenopausal bleeding: Secondary | ICD-10-CM

## 2011-10-04 ENCOUNTER — Encounter (HOSPITAL_COMMUNITY)
Admission: RE | Admit: 2011-10-04 | Discharge: 2011-10-04 | Disposition: A | Discharge status: Home / Self Care | Payer: Self-pay | Source: Ambulatory Visit | Attending: Cardiology | Admitting: Cardiology

## 2011-10-05 LAB — HM PAP SMEAR

## 2011-10-07 ENCOUNTER — Encounter (HOSPITAL_COMMUNITY): Payer: Self-pay

## 2011-10-07 DIAGNOSIS — I447 Left bundle-branch block, unspecified: Secondary | ICD-10-CM | POA: Insufficient documentation

## 2011-10-07 DIAGNOSIS — Z9861 Coronary angioplasty status: Secondary | ICD-10-CM | POA: Insufficient documentation

## 2011-10-07 DIAGNOSIS — I2510 Atherosclerotic heart disease of native coronary artery without angina pectoris: Secondary | ICD-10-CM | POA: Insufficient documentation

## 2011-10-07 DIAGNOSIS — R079 Chest pain, unspecified: Secondary | ICD-10-CM | POA: Insufficient documentation

## 2011-10-07 DIAGNOSIS — E785 Hyperlipidemia, unspecified: Secondary | ICD-10-CM | POA: Insufficient documentation

## 2011-10-07 DIAGNOSIS — R000 Tachycardia, unspecified: Secondary | ICD-10-CM | POA: Insufficient documentation

## 2011-10-07 DIAGNOSIS — Z5189 Encounter for other specified aftercare: Secondary | ICD-10-CM | POA: Insufficient documentation

## 2011-10-07 DIAGNOSIS — J45909 Unspecified asthma, uncomplicated: Secondary | ICD-10-CM | POA: Insufficient documentation

## 2011-10-07 DIAGNOSIS — I200 Unstable angina: Secondary | ICD-10-CM | POA: Insufficient documentation

## 2011-10-08 ENCOUNTER — Ambulatory Visit (HOSPITAL_COMMUNITY): Payer: 59

## 2011-10-08 ENCOUNTER — Ambulatory Visit (HOSPITAL_COMMUNITY)
Admission: RE | Admit: 2011-10-08 | Discharge: 2011-10-08 | Disposition: A | Discharge status: Home / Self Care | Payer: 59 | Source: Ambulatory Visit | Attending: Obstetrics & Gynecology | Admitting: Obstetrics & Gynecology

## 2011-10-08 DIAGNOSIS — N950 Postmenopausal bleeding: Secondary | ICD-10-CM | POA: Insufficient documentation

## 2011-10-08 DIAGNOSIS — D252 Subserosal leiomyoma of uterus: Secondary | ICD-10-CM | POA: Insufficient documentation

## 2011-10-09 ENCOUNTER — Encounter (HOSPITAL_COMMUNITY): Payer: Self-pay

## 2011-10-11 ENCOUNTER — Encounter (HOSPITAL_COMMUNITY): Payer: Self-pay

## 2011-10-14 ENCOUNTER — Encounter (HOSPITAL_COMMUNITY): Payer: Self-pay

## 2011-10-14 ENCOUNTER — Telehealth: Payer: Self-pay | Admitting: Cardiology

## 2011-10-14 NOTE — Telephone Encounter (Signed)
Please return call to patient at [phone] concerning upcoming Biopsy.

## 2011-10-16 ENCOUNTER — Encounter (HOSPITAL_COMMUNITY): Payer: Self-pay

## 2011-10-16 NOTE — Telephone Encounter (Signed)
Left message for pt of dr crenshaw's recommendations  

## 2011-10-16 NOTE — Telephone Encounter (Signed)
Would prefer to delay until 9/13. If unable to delay, dc effient 7 days prior to procedure and resume after. Do not dc aspirin. Olga Millers

## 2011-10-16 NOTE — Telephone Encounter (Signed)
Spoke with pt, she is possibility going to have to have a DNC. She will need clearance and will need to know about stopping the effient for the procedure. Will forward for dr Jens Som review

## 2011-10-16 NOTE — Telephone Encounter (Signed)
Patient returning nurse call, she can be reached at hm#  °

## 2011-10-18 ENCOUNTER — Encounter (HOSPITAL_COMMUNITY)
Admission: RE | Admit: 2011-10-18 | Discharge: 2011-10-18 | Disposition: A | Discharge status: Home / Self Care | Payer: Self-pay | Source: Ambulatory Visit | Attending: Cardiology | Admitting: Cardiology

## 2011-10-21 ENCOUNTER — Encounter (HOSPITAL_COMMUNITY)
Admission: RE | Admit: 2011-10-21 | Discharge: 2011-10-21 | Disposition: A | Discharge status: Home / Self Care | Payer: Self-pay | Source: Ambulatory Visit | Attending: Cardiology | Admitting: Cardiology

## 2011-10-23 ENCOUNTER — Encounter (HOSPITAL_COMMUNITY): Payer: Self-pay

## 2011-10-25 ENCOUNTER — Encounter (HOSPITAL_COMMUNITY)
Admission: RE | Admit: 2011-10-25 | Discharge: 2011-10-25 | Disposition: A | Discharge status: Home / Self Care | Payer: Self-pay | Source: Ambulatory Visit | Attending: Cardiology | Admitting: Cardiology

## 2011-10-28 ENCOUNTER — Encounter (HOSPITAL_COMMUNITY)
Admission: RE | Admit: 2011-10-28 | Discharge: 2011-10-28 | Disposition: A | Discharge status: Home / Self Care | Payer: Self-pay | Source: Ambulatory Visit | Attending: Cardiology | Admitting: Cardiology

## 2011-10-30 ENCOUNTER — Encounter (HOSPITAL_COMMUNITY): Payer: Self-pay

## 2011-11-01 ENCOUNTER — Encounter (HOSPITAL_COMMUNITY)
Admission: RE | Admit: 2011-11-01 | Discharge: 2011-11-01 | Disposition: A | Discharge status: Home / Self Care | Payer: Self-pay | Source: Ambulatory Visit | Attending: Cardiology | Admitting: Cardiology

## 2011-11-04 ENCOUNTER — Encounter (HOSPITAL_COMMUNITY)
Admission: RE | Admit: 2011-11-04 | Discharge: 2011-11-04 | Disposition: A | Discharge status: Home / Self Care | Payer: Self-pay | Source: Ambulatory Visit | Attending: Cardiology | Admitting: Cardiology

## 2011-11-04 DIAGNOSIS — Z5189 Encounter for other specified aftercare: Secondary | ICD-10-CM | POA: Insufficient documentation

## 2011-11-04 DIAGNOSIS — E785 Hyperlipidemia, unspecified: Secondary | ICD-10-CM | POA: Insufficient documentation

## 2011-11-04 DIAGNOSIS — R000 Tachycardia, unspecified: Secondary | ICD-10-CM | POA: Insufficient documentation

## 2011-11-04 DIAGNOSIS — R079 Chest pain, unspecified: Secondary | ICD-10-CM | POA: Insufficient documentation

## 2011-11-04 DIAGNOSIS — J45909 Unspecified asthma, uncomplicated: Secondary | ICD-10-CM | POA: Insufficient documentation

## 2011-11-04 DIAGNOSIS — I447 Left bundle-branch block, unspecified: Secondary | ICD-10-CM | POA: Insufficient documentation

## 2011-11-04 DIAGNOSIS — I2510 Atherosclerotic heart disease of native coronary artery without angina pectoris: Secondary | ICD-10-CM | POA: Insufficient documentation

## 2011-11-04 DIAGNOSIS — I200 Unstable angina: Secondary | ICD-10-CM | POA: Insufficient documentation

## 2011-11-04 DIAGNOSIS — Z9861 Coronary angioplasty status: Secondary | ICD-10-CM | POA: Insufficient documentation

## 2011-11-06 ENCOUNTER — Encounter (HOSPITAL_COMMUNITY)
Admission: RE | Admit: 2011-11-06 | Discharge: 2011-11-06 | Disposition: A | Discharge status: Home / Self Care | Payer: Self-pay | Source: Ambulatory Visit | Attending: Cardiology | Admitting: Cardiology

## 2011-11-08 ENCOUNTER — Encounter (HOSPITAL_COMMUNITY): Admission: RE | Admit: 2011-11-08 | Payer: Self-pay | Source: Ambulatory Visit

## 2011-11-11 ENCOUNTER — Encounter (HOSPITAL_COMMUNITY)
Admission: RE | Admit: 2011-11-11 | Discharge: 2011-11-11 | Disposition: A | Discharge status: Home / Self Care | Payer: Self-pay | Source: Ambulatory Visit | Attending: Cardiology | Admitting: Cardiology

## 2011-11-13 ENCOUNTER — Encounter (HOSPITAL_COMMUNITY): Payer: Self-pay

## 2011-11-15 ENCOUNTER — Encounter (HOSPITAL_COMMUNITY): Payer: Self-pay

## 2011-11-18 ENCOUNTER — Encounter (HOSPITAL_COMMUNITY)
Admission: RE | Admit: 2011-11-18 | Discharge: 2011-11-18 | Disposition: A | Discharge status: Home / Self Care | Payer: Self-pay | Source: Ambulatory Visit | Attending: Cardiology | Admitting: Cardiology

## 2011-11-20 ENCOUNTER — Encounter (HOSPITAL_COMMUNITY)
Admission: RE | Admit: 2011-11-20 | Discharge: 2011-11-20 | Disposition: A | Discharge status: Home / Self Care | Payer: Self-pay | Source: Ambulatory Visit | Attending: Cardiology | Admitting: Cardiology

## 2011-11-22 ENCOUNTER — Encounter (HOSPITAL_COMMUNITY): Payer: Self-pay

## 2011-11-22 ENCOUNTER — Encounter (HOSPITAL_COMMUNITY): Admission: RE | Payer: Self-pay | Source: Ambulatory Visit

## 2011-11-22 ENCOUNTER — Ambulatory Visit (HOSPITAL_COMMUNITY): Admission: RE | Admit: 2011-11-22 | Payer: 59 | Source: Ambulatory Visit | Admitting: Obstetrics & Gynecology

## 2011-11-22 SURGERY — DILATATION AND CURETTAGE /HYSTEROSCOPY
Anesthesia: Choice

## 2011-11-25 ENCOUNTER — Encounter (HOSPITAL_COMMUNITY)
Admission: RE | Admit: 2011-11-25 | Discharge: 2011-11-25 | Disposition: A | Discharge status: Home / Self Care | Payer: Self-pay | Source: Ambulatory Visit | Attending: Cardiology | Admitting: Cardiology

## 2011-11-27 ENCOUNTER — Encounter (HOSPITAL_COMMUNITY): Payer: Self-pay

## 2011-11-29 ENCOUNTER — Encounter (HOSPITAL_COMMUNITY)
Admission: RE | Admit: 2011-11-29 | Discharge: 2011-11-29 | Disposition: A | Discharge status: Home / Self Care | Payer: Self-pay | Source: Ambulatory Visit | Attending: Cardiology | Admitting: Cardiology

## 2011-12-02 ENCOUNTER — Encounter (HOSPITAL_COMMUNITY): Payer: Self-pay

## 2011-12-04 ENCOUNTER — Encounter (HOSPITAL_COMMUNITY): Payer: Self-pay

## 2011-12-06 ENCOUNTER — Encounter (HOSPITAL_COMMUNITY): Payer: Self-pay

## 2011-12-06 DIAGNOSIS — Z9861 Coronary angioplasty status: Secondary | ICD-10-CM | POA: Insufficient documentation

## 2011-12-06 DIAGNOSIS — R000 Tachycardia, unspecified: Secondary | ICD-10-CM | POA: Insufficient documentation

## 2011-12-06 DIAGNOSIS — I2510 Atherosclerotic heart disease of native coronary artery without angina pectoris: Secondary | ICD-10-CM | POA: Insufficient documentation

## 2011-12-06 DIAGNOSIS — E785 Hyperlipidemia, unspecified: Secondary | ICD-10-CM | POA: Insufficient documentation

## 2011-12-06 DIAGNOSIS — I447 Left bundle-branch block, unspecified: Secondary | ICD-10-CM | POA: Insufficient documentation

## 2011-12-06 DIAGNOSIS — R079 Chest pain, unspecified: Secondary | ICD-10-CM | POA: Insufficient documentation

## 2011-12-06 DIAGNOSIS — I200 Unstable angina: Secondary | ICD-10-CM | POA: Insufficient documentation

## 2011-12-06 DIAGNOSIS — J45909 Unspecified asthma, uncomplicated: Secondary | ICD-10-CM | POA: Insufficient documentation

## 2011-12-06 DIAGNOSIS — Z5189 Encounter for other specified aftercare: Secondary | ICD-10-CM | POA: Insufficient documentation

## 2011-12-09 ENCOUNTER — Encounter (HOSPITAL_COMMUNITY)
Admission: RE | Admit: 2011-12-09 | Discharge: 2011-12-09 | Disposition: A | Discharge status: Home / Self Care | Payer: Self-pay | Source: Ambulatory Visit | Attending: Cardiology | Admitting: Cardiology

## 2011-12-11 ENCOUNTER — Encounter (HOSPITAL_COMMUNITY): Payer: Self-pay

## 2011-12-13 ENCOUNTER — Encounter (HOSPITAL_COMMUNITY)
Admission: RE | Admit: 2011-12-13 | Discharge: 2011-12-13 | Disposition: A | Discharge status: Home / Self Care | Payer: Self-pay | Source: Ambulatory Visit | Attending: Cardiology | Admitting: Cardiology

## 2011-12-16 ENCOUNTER — Encounter (HOSPITAL_COMMUNITY)
Admission: RE | Admit: 2011-12-16 | Discharge: 2011-12-16 | Disposition: A | Discharge status: Home / Self Care | Payer: Self-pay | Source: Ambulatory Visit | Attending: Cardiology | Admitting: Cardiology

## 2011-12-18 ENCOUNTER — Encounter (HOSPITAL_COMMUNITY): Payer: Self-pay

## 2011-12-20 ENCOUNTER — Encounter (HOSPITAL_COMMUNITY)
Admission: RE | Admit: 2011-12-20 | Discharge: 2011-12-20 | Disposition: A | Discharge status: Home / Self Care | Payer: Self-pay | Source: Ambulatory Visit | Attending: Cardiology | Admitting: Cardiology

## 2011-12-23 ENCOUNTER — Encounter (HOSPITAL_COMMUNITY): Payer: Self-pay

## 2011-12-25 ENCOUNTER — Encounter (HOSPITAL_COMMUNITY)
Admission: RE | Admit: 2011-12-25 | Discharge: 2011-12-25 | Disposition: A | Discharge status: Home / Self Care | Payer: Self-pay | Source: Ambulatory Visit | Attending: Cardiology | Admitting: Cardiology

## 2011-12-27 ENCOUNTER — Encounter (HOSPITAL_COMMUNITY): Payer: Self-pay

## 2011-12-30 ENCOUNTER — Encounter (HOSPITAL_COMMUNITY): Payer: Self-pay

## 2012-01-01 ENCOUNTER — Encounter (HOSPITAL_COMMUNITY): Admission: RE | Admit: 2012-01-01 | Payer: Self-pay | Source: Ambulatory Visit

## 2012-01-03 ENCOUNTER — Encounter (HOSPITAL_COMMUNITY): Payer: Self-pay

## 2012-01-08 ENCOUNTER — Encounter (HOSPITAL_COMMUNITY): Payer: Self-pay

## 2012-01-08 DIAGNOSIS — I2510 Atherosclerotic heart disease of native coronary artery without angina pectoris: Secondary | ICD-10-CM | POA: Insufficient documentation

## 2012-01-08 DIAGNOSIS — I200 Unstable angina: Secondary | ICD-10-CM | POA: Insufficient documentation

## 2012-01-08 DIAGNOSIS — R000 Tachycardia, unspecified: Secondary | ICD-10-CM | POA: Insufficient documentation

## 2012-01-08 DIAGNOSIS — Z9861 Coronary angioplasty status: Secondary | ICD-10-CM | POA: Insufficient documentation

## 2012-01-08 DIAGNOSIS — R079 Chest pain, unspecified: Secondary | ICD-10-CM | POA: Insufficient documentation

## 2012-01-08 DIAGNOSIS — I447 Left bundle-branch block, unspecified: Secondary | ICD-10-CM | POA: Insufficient documentation

## 2012-01-08 DIAGNOSIS — E785 Hyperlipidemia, unspecified: Secondary | ICD-10-CM | POA: Insufficient documentation

## 2012-01-08 DIAGNOSIS — Z5189 Encounter for other specified aftercare: Secondary | ICD-10-CM | POA: Insufficient documentation

## 2012-01-08 DIAGNOSIS — J45909 Unspecified asthma, uncomplicated: Secondary | ICD-10-CM | POA: Insufficient documentation

## 2012-01-10 ENCOUNTER — Encounter (HOSPITAL_COMMUNITY): Payer: Self-pay

## 2012-01-13 ENCOUNTER — Encounter (HOSPITAL_COMMUNITY): Payer: Self-pay

## 2012-01-15 ENCOUNTER — Encounter (HOSPITAL_COMMUNITY): Payer: Self-pay

## 2012-01-17 ENCOUNTER — Encounter (HOSPITAL_COMMUNITY): Payer: Self-pay

## 2012-01-20 ENCOUNTER — Encounter (HOSPITAL_COMMUNITY)
Admission: RE | Admit: 2012-01-20 | Discharge: 2012-01-20 | Disposition: A | Discharge status: Home / Self Care | Payer: Self-pay | Source: Ambulatory Visit | Attending: Cardiology | Admitting: Cardiology

## 2012-01-22 ENCOUNTER — Encounter (HOSPITAL_COMMUNITY): Payer: Self-pay

## 2012-01-24 ENCOUNTER — Encounter (HOSPITAL_COMMUNITY)
Admission: RE | Admit: 2012-01-24 | Discharge: 2012-01-24 | Disposition: A | Discharge status: Home / Self Care | Payer: Self-pay | Source: Ambulatory Visit | Attending: Cardiology | Admitting: Cardiology

## 2012-01-27 ENCOUNTER — Encounter (HOSPITAL_COMMUNITY): Payer: Self-pay

## 2012-01-29 ENCOUNTER — Encounter (HOSPITAL_COMMUNITY): Payer: Self-pay

## 2012-01-31 ENCOUNTER — Encounter (HOSPITAL_COMMUNITY): Payer: Self-pay

## 2012-02-03 ENCOUNTER — Encounter (HOSPITAL_COMMUNITY)
Admission: RE | Admit: 2012-02-03 | Discharge: 2012-02-03 | Disposition: A | Discharge status: Home / Self Care | Payer: Self-pay | Source: Ambulatory Visit | Attending: Cardiology | Admitting: Cardiology

## 2012-02-05 ENCOUNTER — Encounter (HOSPITAL_COMMUNITY)
Admission: RE | Admit: 2012-02-05 | Discharge: 2012-02-05 | Disposition: A | Discharge status: Home / Self Care | Payer: Self-pay | Source: Ambulatory Visit | Attending: Cardiology | Admitting: Cardiology

## 2012-02-05 DIAGNOSIS — Z5189 Encounter for other specified aftercare: Secondary | ICD-10-CM | POA: Insufficient documentation

## 2012-02-05 DIAGNOSIS — J45909 Unspecified asthma, uncomplicated: Secondary | ICD-10-CM | POA: Insufficient documentation

## 2012-02-05 DIAGNOSIS — E785 Hyperlipidemia, unspecified: Secondary | ICD-10-CM | POA: Insufficient documentation

## 2012-02-05 DIAGNOSIS — I200 Unstable angina: Secondary | ICD-10-CM | POA: Insufficient documentation

## 2012-02-05 DIAGNOSIS — I2510 Atherosclerotic heart disease of native coronary artery without angina pectoris: Secondary | ICD-10-CM | POA: Insufficient documentation

## 2012-02-05 DIAGNOSIS — I447 Left bundle-branch block, unspecified: Secondary | ICD-10-CM | POA: Insufficient documentation

## 2012-02-05 DIAGNOSIS — Z9861 Coronary angioplasty status: Secondary | ICD-10-CM | POA: Insufficient documentation

## 2012-02-05 DIAGNOSIS — R079 Chest pain, unspecified: Secondary | ICD-10-CM | POA: Insufficient documentation

## 2012-02-05 DIAGNOSIS — R000 Tachycardia, unspecified: Secondary | ICD-10-CM | POA: Insufficient documentation

## 2012-02-07 ENCOUNTER — Encounter (HOSPITAL_COMMUNITY)
Admission: RE | Admit: 2012-02-07 | Discharge: 2012-02-07 | Disposition: A | Discharge status: Home / Self Care | Payer: Self-pay | Source: Ambulatory Visit | Attending: Cardiology | Admitting: Cardiology

## 2012-02-10 ENCOUNTER — Encounter (HOSPITAL_COMMUNITY)
Admission: RE | Admit: 2012-02-10 | Discharge: 2012-02-10 | Disposition: A | Discharge status: Home / Self Care | Payer: Self-pay | Source: Ambulatory Visit | Attending: Cardiology | Admitting: Cardiology

## 2012-02-12 ENCOUNTER — Encounter (HOSPITAL_COMMUNITY): Payer: Self-pay

## 2012-02-14 ENCOUNTER — Encounter (HOSPITAL_COMMUNITY)
Admission: RE | Admit: 2012-02-14 | Discharge: 2012-02-14 | Disposition: A | Discharge status: Home / Self Care | Payer: Self-pay | Source: Ambulatory Visit | Attending: Cardiology | Admitting: Cardiology

## 2012-02-17 ENCOUNTER — Encounter (HOSPITAL_COMMUNITY): Payer: Self-pay

## 2012-02-19 ENCOUNTER — Encounter (HOSPITAL_COMMUNITY): Payer: Self-pay

## 2012-02-21 ENCOUNTER — Encounter (HOSPITAL_COMMUNITY): Payer: Self-pay

## 2012-02-24 ENCOUNTER — Encounter (HOSPITAL_COMMUNITY)
Admission: RE | Admit: 2012-02-24 | Discharge: 2012-02-24 | Disposition: A | Discharge status: Home / Self Care | Payer: Self-pay | Source: Ambulatory Visit | Attending: Cardiology | Admitting: Cardiology

## 2012-02-26 ENCOUNTER — Encounter (HOSPITAL_COMMUNITY): Payer: Self-pay

## 2012-02-28 ENCOUNTER — Encounter (HOSPITAL_COMMUNITY)
Admission: RE | Admit: 2012-02-28 | Discharge: 2012-02-28 | Disposition: A | Discharge status: Home / Self Care | Payer: Self-pay | Source: Ambulatory Visit | Attending: Cardiology | Admitting: Cardiology

## 2012-03-02 ENCOUNTER — Encounter (HOSPITAL_COMMUNITY): Payer: Self-pay

## 2012-03-04 ENCOUNTER — Encounter (HOSPITAL_COMMUNITY)
Admission: RE | Admit: 2012-03-04 | Discharge: 2012-03-04 | Disposition: A | Discharge status: Home / Self Care | Payer: Self-pay | Source: Ambulatory Visit | Attending: Cardiology | Admitting: Cardiology

## 2012-03-06 ENCOUNTER — Encounter (HOSPITAL_COMMUNITY)
Admission: RE | Admit: 2012-03-06 | Discharge: 2012-03-06 | Disposition: A | Discharge status: Home / Self Care | Payer: Self-pay | Source: Ambulatory Visit | Attending: Cardiology | Admitting: Cardiology

## 2012-03-06 ENCOUNTER — Ambulatory Visit (INDEPENDENT_AMBULATORY_CARE_PROVIDER_SITE_OTHER): Payer: 59 | Admitting: Cardiology

## 2012-03-06 ENCOUNTER — Encounter: Payer: Self-pay | Admitting: Cardiology

## 2012-03-06 VITALS — BP 148/86 | HR 75 | Wt 134.0 lb

## 2012-03-06 DIAGNOSIS — R079 Chest pain, unspecified: Secondary | ICD-10-CM | POA: Insufficient documentation

## 2012-03-06 DIAGNOSIS — I447 Left bundle-branch block, unspecified: Secondary | ICD-10-CM | POA: Insufficient documentation

## 2012-03-06 DIAGNOSIS — I2510 Atherosclerotic heart disease of native coronary artery without angina pectoris: Secondary | ICD-10-CM

## 2012-03-06 DIAGNOSIS — I200 Unstable angina: Secondary | ICD-10-CM | POA: Insufficient documentation

## 2012-03-06 DIAGNOSIS — E785 Hyperlipidemia, unspecified: Secondary | ICD-10-CM

## 2012-03-06 DIAGNOSIS — J45909 Unspecified asthma, uncomplicated: Secondary | ICD-10-CM | POA: Insufficient documentation

## 2012-03-06 DIAGNOSIS — R000 Tachycardia, unspecified: Secondary | ICD-10-CM | POA: Insufficient documentation

## 2012-03-06 DIAGNOSIS — Z5189 Encounter for other specified aftercare: Secondary | ICD-10-CM | POA: Insufficient documentation

## 2012-03-06 DIAGNOSIS — Z9861 Coronary angioplasty status: Secondary | ICD-10-CM | POA: Insufficient documentation

## 2012-03-06 MED ORDER — NITROGLYCERIN 0.4 MG SL SUBL: 0.4000 mg | SUBLINGUAL_TABLET | SUBLINGUAL | Status: DC | PRN

## 2012-03-06 NOTE — Progress Notes (Signed)
   HPI: Pleasant female for followup of coronary disease. Previously seen for chest pain. Stress echo was markedly abnormal with nonsustained ventricular tachycardia and ischemia in the anteroseptal wall and apex. Patient underwent cardiac catheterization in September of 2012 which revealed a 95% ostial LAD lesion. The patient had PCI with a drug-eluting stent. There was some jailing of the circumflex. There was recommendation for aspirin indefinitely and effient for at least one year and possibly indefinitely. Repeat cath at [facility] in Dec 2012 because of palpitations and chest pain revealed patency of LAD stent and she had no other significant CAD (Proximal RCA 10%, left main 20%, small OM1 50%, mid LAD 20%, D2 20%). Patient with significant radial spasm during the procedure. She was given Toprol-XL 25 mg daily at discharge. TSH was 2.27. Had event monitor placed in Jan 2013 that showed sinus rhythm. Carotid Dopplers December 2012 showed 0-39% bilateral stenosis. 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 Since she was last seen in March 2013, she has some dyspnea when she feels stressed. No orthopnea, PND, pedal edema, dyspnea on exertion. She has had occasional chest pain. One episode occurred approximately one month ago that concern her. She has had no symptoms like her previous angina. She does not have exertional chest pain.   Current Outpatient Prescriptions  Medication Sig Dispense Refill  . aspirin 81 MG tablet Take 81 mg by mouth daily.        . prasugrel (EFFIENT) 10 MG TABS Take 10 mg by mouth daily.        . rosuvastatin (CRESTOR) 20 MG tablet Take 20 mg by mouth daily.           Past Medical History  Diagnosis Date  . ALLERGIC RHINITIS 03/02/2007  . BREAST CYST, LEFT 11/23/2009  . 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 CAD (coronary artery disease)     s/p DES to LAD 9/12  . HLD (hyperlipidemia)     Past Surgical History  Procedure  Date  . Breast surgery     BILAT IMPLANTS  . Breast biopsy     History   Social History  . Marital Status: Married    Spouse Name: N/A    Number of Children: N/A  . 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 Smoking status: Never Smoker   . Smokeless tobacco: Never Used  . Alcohol Use: Yes     Occasional  . Drug Use: No  . Sexually Active: Not on file   Other Topics Concern  . Not on file   Social History Narrative   Occupation: Never SmokedAlcohol use- noMarriedDrug use- noRegular Exercise- yes     ROS: no fevers or chills, productive cough, hemoptysis, dysphasia, odynophagia, melena, hematochezia, dysuria, hematuria, rash, seizure activity, orthopnea, PND, pedal edema, claudication. Remaining systems are negative.  Physical Exam: Well-developed well-nourished in no acute distress.  Skin is warm and dry.  HEENT is normal.  Neck is supple.  Chest is clear to auscultation with normal expansion.  Cardiovascular exam is regular rate and rhythm.  Abdominal exam nontender or distended. No masses palpated. Extremities show no edema. neuro grossly intact  ECG sinus rhythm at a rate of 75. No ST changes.

## 2012-03-06 NOTE — Patient Instructions (Addendum)
Your physician wants you to follow-up in: 6 MONTHS WITH DR Jens Som You will receive a reminder letter in the mail two months in advance. If you don't receive a letter, please call our office to schedule the follow-up appointment.   Your physician has requested that you have en exercise stress myoview. For further information please visit [email or web address] Please follow instruction sheet, as given.   Your physician recommends that you HAVE LABS WITH STRESS TEST

## 2012-03-06 NOTE — Assessment & Plan Note (Signed)
Continue statin. Check lipids, liver and BMET.

## 2012-03-06 NOTE — Addendum Note (Signed)
Addended by: Freddi Starr on: 03/06/2012 12:09 PM   Modules accepted: Orders

## 2012-03-06 NOTE — Assessment & Plan Note (Signed)
Patient symptoms are atypical and her electrocardiogram is normal. However she had an atypical presentation prior to her LAD stent. Her symptoms are not similar to her previous angina. Proceed with Myoview for risk stratification.

## 2012-03-06 NOTE — Assessment & Plan Note (Signed)
Continue aspirin, effient and statin. We'll consider discontinuing effient when she returns in 6 months. Check hemoglobin.

## 2012-03-09 ENCOUNTER — Encounter (HOSPITAL_COMMUNITY): Payer: Self-pay

## 2012-03-11 ENCOUNTER — Encounter (HOSPITAL_COMMUNITY): Payer: Self-pay

## 2012-03-13 ENCOUNTER — Encounter (HOSPITAL_COMMUNITY): Payer: Self-pay

## 2012-03-16 ENCOUNTER — Encounter (HOSPITAL_COMMUNITY): Payer: Self-pay

## 2012-03-18 ENCOUNTER — Encounter (HOSPITAL_COMMUNITY): Payer: Self-pay

## 2012-03-19 ENCOUNTER — Telehealth: Payer: Self-pay | Admitting: Cardiology

## 2012-03-19 NOTE — Telephone Encounter (Signed)
New Problem:    Patient called in wanting to know if you would call in a topical anestetic so the IV won't hurt so much for her upcoming stress test.  Would like Eamls cream. Please call back.

## 2012-03-19 NOTE — Telephone Encounter (Signed)
Left message for pt, I got lidocaine cream from [facility] to use for IV when she comes.

## 2012-03-20 ENCOUNTER — Encounter (HOSPITAL_COMMUNITY): Payer: Self-pay

## 2012-03-23 ENCOUNTER — Encounter (HOSPITAL_COMMUNITY): Payer: Self-pay

## 2012-03-24 ENCOUNTER — Other Ambulatory Visit (INDEPENDENT_AMBULATORY_CARE_PROVIDER_SITE_OTHER): Payer: 59

## 2012-03-24 ENCOUNTER — Ambulatory Visit (HOSPITAL_COMMUNITY): Payer: 59 | Attending: Cardiology | Admitting: Radiology

## 2012-03-24 VITALS — BP 117/63 | HR 71 | Ht 64.0 in | Wt 133.0 lb

## 2012-03-24 DIAGNOSIS — I2510 Atherosclerotic heart disease of native coronary artery without angina pectoris: Secondary | ICD-10-CM

## 2012-03-24 DIAGNOSIS — R079 Chest pain, unspecified: Secondary | ICD-10-CM

## 2012-03-24 DIAGNOSIS — R0602 Shortness of breath: Secondary | ICD-10-CM

## 2012-03-24 LAB — CBC WITH DIFFERENTIAL/PLATELET
Basophils Absolute: 0.1 10*3/uL (ref 0.0–0.1)
Eosinophils Absolute: 0.4 10*3/uL (ref 0.0–0.7)
HCT: 42 % (ref 36.0–46.0)
Hemoglobin: 13.9 g/dL (ref 12.0–15.0)
Lymphs Abs: 2.2 10*3/uL (ref 0.7–4.0)
MCHC: 33.2 g/dL (ref 30.0–36.0)
Monocytes Absolute: 0.6 10*3/uL (ref 0.1–1.0)
Neutro Abs: 2.3 10*3/uL (ref 1.4–7.7)
RDW: 13.1 % (ref 11.5–14.6)

## 2012-03-24 LAB — HEPATIC FUNCTION PANEL: Albumin: 4.4 g/dL (ref 3.5–5.2)

## 2012-03-24 LAB — BASIC METABOLIC PANEL
BUN: 15 mg/dL (ref 6–23)
Calcium: 9.1 mg/dL (ref 8.4–10.5)
GFR: 112.11 mL/min (ref >60.00)
Glucose, Bld: 89 mg/dL (ref 70–99)
Potassium: 4.7 mEq/L (ref 3.5–5.1)

## 2012-03-24 LAB — LIPID PANEL
Cholesterol: 177 mg/dL (ref 0–200)
HDL: 92.6 mg/dL (ref >39.00)
VLDL: 12.2 mg/dL (ref 0.0–40.0)

## 2012-03-24 MED ORDER — TECHNETIUM TC 99M SESTAMIBI GENERIC - CARDIOLITE
33.0000 | Freq: Once | INTRAVENOUS | Status: AC | PRN
  Administered 2012-03-24: 33 via INTRAVENOUS

## 2012-03-24 MED ORDER — TECHNETIUM TC 99M SESTAMIBI GENERIC - CARDIOLITE
11.0000 | Freq: Once | INTRAVENOUS | Status: AC | PRN
  Administered 2012-03-24: 11 via INTRAVENOUS

## 2012-03-24 NOTE — Progress Notes (Signed)
[facility] SITE 3 NUCLEAR MED [id number] [place] [address] [phone]  Cardiology Nuclear Med Study  Angelica [name] is a 56 y.o. female     MRN : [id number]     DOB: [birth year]  Procedure Date: 03/24/2012  Nuclear Med Background Indication for Stress Test:  Evaluation for Ischemia and Stent Patency History:  9/12 Stress Echo:NSVT with antero-septal and apical ischemia>Stent-LAD; 12/12 Cath:Patent Stent, mild n/o CAD Cardiac Risk Factors: Carotid Disease and Lipids  Symptoms:  Chest Pain/Pressure>Back with Stress.  (last episode of chest discomfort was about 2-days ago), DOE and SOB   Nuclear Pre-Procedure Caffeine/Decaff Intake:  None > 12 hrs NPO After: 9:00pm   Lungs:  Clear. O2 Sat: 100% on room air. IV 0.9% NS with Angio Cath:  22g  IV Site: L Antecubital x 1, tolerated well IV Started by:  Irean Hong, RN  Chest Size (in):  36 Cup Size: C, Bilateral Implants  Height: 5\' 4"  (1.626 m)  Weight:  133 lb (60.328 kg)  BMI:  Body mass index is 22.83 kg/(m^2). Tech Comments:  n/a    Nuclear Med Study 1 or 2 day study: 1 day  Stress Test Type:  Stress  Reading MD: Marca Ancona, MD  Order Authorizing Provider:  Olga Millers, MD  Resting Radionuclide: Technetium 29m Sestamibi  Resting Radionuclide Dose: 11.0 mCi   Stress Radionuclide:  Technetium 8m Sestamibi  Stress Radionuclide Dose: 33.0 mCi           Stress Protocol Rest HR: 71 Stress HR: 155  Rest BP: 117/63 Stress BP: 157/78  Exercise Time (min): 10:00 METS: 10.7   Predicted Max HR: 165 bpm % Max HR: 93.94 bpm Rate Pressure Product: 62952   Dose of Adenosine (mg):  n/a Dose of Lexiscan: n/a mg  Dose of Atropine (mg): n/a Dose of Dobutamine: n/a mcg/kg/min (at max HR)  Stress Test Technologist: Smiley Houseman, CMA-N  Nuclear Technologist:  Domenic Polite, CNMT     Rest Procedure:  Myocardial perfusion imaging was performed at rest 45 minutes following the  intravenous administration of Technetium 57m Sestamibi.  Rest ECG: No acute changes.  Stress Procedure:  The patient exercised on the treadmill utilizing the Bruce protocol for ten minutes. She then stopped due to fatigue and denied any chest pain.  There were no diagnostic ST-T wave changes.  Technetium 14m Sestamibi was injected at peak exercise and myocardial perfusion imaging was performed after a brief delay.  Stress ECG: No significant change from baseline ECG  QPS Raw Data Images:  Normal; no motion artifact; normal heart/lung ratio. Stress Images:  Small, mild basal inferoseptal perfusion defect.  Rest Images:  Small, mild basal inferoseptal perfusion defect.  Subtraction (SDS):  Fixed, small mild basal inferoseptal perfusion defect.  Transient Ischemic Dilatation (Normal <1.22):  0.91 Lung/Heart Ratio (Normal <0.45):  0.48  Quantitative Gated Spect Images QGS EDV:  56 ml QGS ESV:  15 ml  Impression Exercise Capacity:  Good exercise capacity. BP Response:  Normal blood pressure response. Clinical Symptoms:  Short of breath, fatigue.  ECG Impression:  No significant ST segment change suggestive of ischemia. Comparison with Prior Nuclear Study: No previous nuclear study performed  Overall Impression:  Low risk stress nuclear study.  Small, mild fixed basal inferoseptal perfusion defect. Suspect this is more likely to be artifact than prior MI.  No ischemia.   LV Ejection Fraction: 73%.  LV Wall Motion:  NL LV Function; NL Wall Motion  Marca Ancona 03/24/2012

## 2012-03-25 ENCOUNTER — Encounter (HOSPITAL_COMMUNITY): Payer: Self-pay

## 2012-03-26 ENCOUNTER — Telehealth: Payer: Self-pay | Admitting: Cardiology

## 2012-03-26 NOTE — Telephone Encounter (Signed)
Spoke with pt, aware of test results. 

## 2012-03-26 NOTE — Telephone Encounter (Signed)
New Problem: ° ° ° °Patient returned your call.  Please call back. °

## 2012-03-27 ENCOUNTER — Encounter (HOSPITAL_COMMUNITY): Payer: Self-pay

## 2012-03-30 ENCOUNTER — Encounter (HOSPITAL_COMMUNITY)
Admission: RE | Admit: 2012-03-30 | Discharge: 2012-03-30 | Disposition: A | Discharge status: Home / Self Care | Payer: Self-pay | Source: Ambulatory Visit | Attending: Cardiology | Admitting: Cardiology

## 2012-04-01 ENCOUNTER — Encounter (HOSPITAL_COMMUNITY): Payer: Self-pay

## 2012-04-06 ENCOUNTER — Encounter (HOSPITAL_COMMUNITY): Payer: Self-pay

## 2012-04-06 DIAGNOSIS — I2510 Atherosclerotic heart disease of native coronary artery without angina pectoris: Secondary | ICD-10-CM | POA: Insufficient documentation

## 2012-04-06 DIAGNOSIS — R079 Chest pain, unspecified: Secondary | ICD-10-CM | POA: Insufficient documentation

## 2012-04-06 DIAGNOSIS — Z5189 Encounter for other specified aftercare: Secondary | ICD-10-CM | POA: Insufficient documentation

## 2012-04-06 DIAGNOSIS — I447 Left bundle-branch block, unspecified: Secondary | ICD-10-CM | POA: Insufficient documentation

## 2012-04-06 DIAGNOSIS — Z9861 Coronary angioplasty status: Secondary | ICD-10-CM | POA: Insufficient documentation

## 2012-04-06 DIAGNOSIS — R000 Tachycardia, unspecified: Secondary | ICD-10-CM | POA: Insufficient documentation

## 2012-04-06 DIAGNOSIS — J45909 Unspecified asthma, uncomplicated: Secondary | ICD-10-CM | POA: Insufficient documentation

## 2012-04-06 DIAGNOSIS — E785 Hyperlipidemia, unspecified: Secondary | ICD-10-CM | POA: Insufficient documentation

## 2012-04-06 DIAGNOSIS — I200 Unstable angina: Secondary | ICD-10-CM | POA: Insufficient documentation

## 2012-04-08 ENCOUNTER — Encounter (HOSPITAL_COMMUNITY): Payer: Self-pay

## 2012-04-10 ENCOUNTER — Encounter (HOSPITAL_COMMUNITY): Payer: Self-pay

## 2012-04-13 ENCOUNTER — Encounter (HOSPITAL_COMMUNITY)
Admission: RE | Admit: 2012-04-13 | Discharge: 2012-04-13 | Disposition: A | Discharge status: Home / Self Care | Payer: Self-pay | Source: Ambulatory Visit | Attending: Cardiology | Admitting: Cardiology

## 2012-04-15 ENCOUNTER — Encounter (HOSPITAL_COMMUNITY)
Admission: RE | Admit: 2012-04-15 | Discharge: 2012-04-15 | Disposition: A | Discharge status: Home / Self Care | Payer: Self-pay | Source: Ambulatory Visit | Attending: Cardiology | Admitting: Cardiology

## 2012-04-16 ENCOUNTER — Other Ambulatory Visit: Payer: Self-pay | Admitting: Cardiology

## 2012-04-17 ENCOUNTER — Encounter (HOSPITAL_COMMUNITY): Payer: Self-pay

## 2012-04-20 ENCOUNTER — Encounter (HOSPITAL_COMMUNITY): Payer: Self-pay

## 2012-04-22 ENCOUNTER — Encounter (HOSPITAL_COMMUNITY): Payer: Self-pay

## 2012-04-24 ENCOUNTER — Encounter (HOSPITAL_COMMUNITY): Payer: Self-pay

## 2012-04-27 ENCOUNTER — Encounter (HOSPITAL_COMMUNITY): Payer: Self-pay

## 2012-05-01 ENCOUNTER — Encounter (HOSPITAL_COMMUNITY): Payer: Self-pay

## 2012-05-04 ENCOUNTER — Encounter (HOSPITAL_COMMUNITY)
Admission: RE | Admit: 2012-05-04 | Discharge: 2012-05-04 | Disposition: A | Discharge status: Home / Self Care | Payer: Self-pay | Source: Ambulatory Visit | Attending: Cardiology | Admitting: Cardiology

## 2012-05-08 ENCOUNTER — Encounter (HOSPITAL_COMMUNITY)
Admission: RE | Admit: 2012-05-08 | Discharge: 2012-05-08 | Disposition: A | Discharge status: Home / Self Care | Payer: Self-pay | Source: Ambulatory Visit | Attending: Cardiology | Admitting: Cardiology

## 2012-05-08 DIAGNOSIS — E785 Hyperlipidemia, unspecified: Secondary | ICD-10-CM | POA: Insufficient documentation

## 2012-05-08 DIAGNOSIS — R000 Tachycardia, unspecified: Secondary | ICD-10-CM | POA: Insufficient documentation

## 2012-05-08 DIAGNOSIS — Z9861 Coronary angioplasty status: Secondary | ICD-10-CM | POA: Insufficient documentation

## 2012-05-08 DIAGNOSIS — Z5189 Encounter for other specified aftercare: Secondary | ICD-10-CM | POA: Insufficient documentation

## 2012-05-08 DIAGNOSIS — I447 Left bundle-branch block, unspecified: Secondary | ICD-10-CM | POA: Insufficient documentation

## 2012-05-08 DIAGNOSIS — I2510 Atherosclerotic heart disease of native coronary artery without angina pectoris: Secondary | ICD-10-CM | POA: Insufficient documentation

## 2012-05-08 DIAGNOSIS — I200 Unstable angina: Secondary | ICD-10-CM | POA: Insufficient documentation

## 2012-05-11 ENCOUNTER — Other Ambulatory Visit: Payer: Self-pay | Admitting: Family Medicine

## 2012-05-11 ENCOUNTER — Encounter (HOSPITAL_COMMUNITY)
Admission: RE | Admit: 2012-05-11 | Discharge: 2012-05-11 | Disposition: A | Discharge status: Home / Self Care | Payer: Self-pay | Source: Ambulatory Visit | Attending: Cardiology | Admitting: Cardiology

## 2012-05-11 DIAGNOSIS — Z9882 Breast implant status: Secondary | ICD-10-CM

## 2012-05-11 DIAGNOSIS — Z1231 Encounter for screening mammogram for malignant neoplasm of breast: Secondary | ICD-10-CM

## 2012-05-13 ENCOUNTER — Encounter (HOSPITAL_COMMUNITY): Payer: Self-pay

## 2012-05-15 ENCOUNTER — Encounter (HOSPITAL_COMMUNITY): Payer: Self-pay

## 2012-05-18 ENCOUNTER — Encounter (HOSPITAL_COMMUNITY)
Admission: RE | Admit: 2012-05-18 | Discharge: 2012-05-18 | Disposition: A | Discharge status: Home / Self Care | Payer: Self-pay | Source: Ambulatory Visit | Attending: Cardiology | Admitting: Cardiology

## 2012-05-19 ENCOUNTER — Telehealth: Payer: Self-pay | Admitting: Cardiology

## 2012-05-19 NOTE — Telephone Encounter (Signed)
Spoke with pt, she has for the last couple weeks  noticed a discomfort in her right shoulder and a tightness in the shoulder that goes into the chest. It is not related to movement and can occur with exertion or rest. She reports this is the same type symptoms prior to the stenting she had done before. She states it is occuring daily and is beginning to become more frequent. Also she reports she has been belching a lot as she did before. When she gets the discomfort she will chew an aspirin. She denies SOB. appt made for pt to see scott weaver pa on Friday this week when dr Jens Som is in the office. She will call back if symptoms change or worsen. Pt agreed with this plan.

## 2012-05-19 NOTE — Telephone Encounter (Signed)
Pt requesting call from debra mathis re how she's been feeling

## 2012-05-20 ENCOUNTER — Encounter (HOSPITAL_COMMUNITY): Payer: Self-pay

## 2012-05-22 ENCOUNTER — Telehealth: Payer: Self-pay | Admitting: Cardiology

## 2012-05-22 ENCOUNTER — Ambulatory Visit (INDEPENDENT_AMBULATORY_CARE_PROVIDER_SITE_OTHER): Payer: 59 | Admitting: Physician Assistant

## 2012-05-22 ENCOUNTER — Encounter: Payer: Self-pay | Admitting: Physician Assistant

## 2012-05-22 ENCOUNTER — Encounter (HOSPITAL_COMMUNITY)
Admission: RE | Admit: 2012-05-22 | Discharge: 2012-05-22 | Disposition: A | Discharge status: Home / Self Care | Payer: Self-pay | Source: Ambulatory Visit | Attending: Cardiology | Admitting: Cardiology

## 2012-05-22 VITALS — BP 127/81 | HR 66 | Ht 64.0 in | Wt 136.1 lb

## 2012-05-22 DIAGNOSIS — I2510 Atherosclerotic heart disease of native coronary artery without angina pectoris: Secondary | ICD-10-CM

## 2012-05-22 DIAGNOSIS — R079 Chest pain, unspecified: Secondary | ICD-10-CM

## 2012-05-22 DIAGNOSIS — E785 Hyperlipidemia, unspecified: Secondary | ICD-10-CM

## 2012-05-22 MED ORDER — METOPROLOL SUCCINATE ER 25 MG PO TB24: 12.5000 mg | ORAL_TABLET | Freq: Every day | ORAL | Status: DC

## 2012-05-22 NOTE — Telephone Encounter (Signed)
New problem:   Seen by scott weaver today. Going to [place] on Sunday morning at 7 am. If she decide not to go then she will need a note.

## 2012-05-22 NOTE — Patient Instructions (Addendum)
Your physician recommends that you schedule a follow-up appointment in:  2-3 weeks with Dr. Jens Som or with Tereso Newcomer ,PA on day Dr. Jens Som is in office.   Your physician has recommended you make the following change in your medication:  Start Toprol 12.5 mg by mouth daily at bedtime. (this will be half of a 25 mg tablet).  Pick up NTG at your pharmacy. (prescription has previously been sent).  Start taking the Prevacid you have at home.

## 2012-05-22 NOTE — Telephone Encounter (Signed)
Left message for pt no problems getting note, to let me know.

## 2012-05-22 NOTE — Progress Notes (Signed)
[address], [address] [place], [place]  [address] Phone: [phone], Fax:  [phone]  Date:  05/22/2012   ID:  Angelica, [name] 03/02/1956, MRN [id number]  PCP:  Evette Georges, MD  Primary Cardiologist:  Dr. Olga Millers     History of Present Illness: Angelica [name] is a 57 y.o. female who presents for further evaluation of right shoulder and chest pain.  She has a hx of CAD, s/p DES to [facility] 9/12 after abnormal ETT-Echo.  LHC 04/2011 at [facility]:  LAD stent was patent and she had no other significant CAD (Proximal RCA 10%, left main 20%, small OM1 50%, mid LAD 20%, D2 20%).  That procedure was c/b R radial occlusion.  Event monitor 1/13: NSR. Carotid Dopplers 12/12:0-39% bilateral. ETT-Myoview 11/13: Exercised for 10 minutes, no EKG changes, no ischemia, inferoseptal defect more likely to be from artifact, EF 73%, normal wall motion. Last seen by Dr. Jens Som 11/13. Since last being seen, she has noted an increase in frequency of right shoulder discomfort. It is sharp and pressure-like. It lasts for a few seconds. She denies radiation to her jaw or arm. She may notice it with exertion. She can exert herself without symptoms. She can also get symptoms at rest. She sometimes has shortness of breath and sometimes feels diaphoretic. She notes frequent belching.  No relation to meals.  She denies syncope, near syncope, nausea. She denies any significant change in dyspnea with exertion. She is NYHA class II. Her symptoms are somewhat similar to what she had prior to her PCI. However, her symptoms were much worse at that time.  Labs (11/13):  K 4.7, creatinine 0.6, ALT 36, LDL 72, Hgb 13.9   Wt Readings from Last 3 Encounters:  05/22/12 136 lb 1.9 oz (61.744 kg)  03/24/12 133 lb (60.328 kg)  03/06/12 134 lb (60.782 kg)     Past Medical History  Diagnosis Date  . ALLERGIC RHINITIS 03/02/2007  . BREAST CYST, LEFT 11/23/2009  . REACTIVE AIRWAY DISEASE 10/21/2008    . CAD (coronary artery disease)     s/p DES to LAD 9/12  . HLD (hyperlipidemia)     Current Outpatient Prescriptions  Medication Sig Dispense Refill  . aspirin 81 MG tablet Take 81 mg by mouth daily.        . CRESTOR 20 MG tablet TAKE 1 TABLET EVERY NIGHT  90 tablet  2  . EFFIENT 10 MG TABS TAKE 1 TABLET DAILY  3 tablet  2  . nitroGLYCERIN (NITROSTAT) 0.4 MG SL tablet Place 1 tablet (0.4 mg total) under the tongue every 5 (five) minutes as needed for chest pain.  25 tablet  12    Allergies:    Allergies  Allergen Reactions  . Prednisone Palpitations    Social History:  The patient  reports that she has never smoked. She has never used smokeless tobacco. She reports that she drinks alcohol. She reports that she does not use illicit drugs.   ROS:  Please see the history of present illness.      All other systems reviewed and negative.   PHYSICAL EXAM: VS:  BP 127/81  Pulse 66  Ht 5\' 4"  (1.626 m)  Wt 136 lb 1.9 oz (61.744 kg)  BMI 23.37 kg/m2 Well nourished, well developed, in no acute distress HEENT: normal Neck: no JVD Vascular: No carotid bruits Cardiac:  normal S1, S2; RRR; no murmur Lungs:  clear to auscultation bilaterally, no wheezing, rhonchi or rales  Abd: soft, nontender, no hepatomegaly Ext: no edema Skin: warm and dry Neuro:  CNs 2-12 intact, no focal abnormalities noted  EKG:  NSR, HR 66, normal axis, no acute changes     ASSESSMENT AND PLAN:  1. Chest Pain:   She has right shoulder discomfort that is similar to what she had prior to her PCI. However, in retrospect, she notes she has had these symptoms off and on since her PCI. They have started to become more frequent. She had similar symptoms to what she is experiencing now several months before her PCI. At that time, her symptoms progressed to significant chest pain with associated arm and jaw pain and nausea. She has not recently had symptoms this extreme. Her ECG is normal. She had a recent low risk  Myoview. We had a long discussion regarding further testing. Given her prior history, it would be reasonable to proceed with cardiac catheterization to rule out progressive CAD.  I did offer this approach to her today.  However, at this time, she would like to hold off. I have recommended that she proceed with a trial of medical therapy. She is intolerant to nitroglycerin due to severe headaches. Therefore, I will place her on Toprol-XL 25 mg one half tablet each bedtime. She will remain on aspirin and Effient. She will keep nitroglycerin available as needed. I will arrange followup in the next 2-3 weeks. If her symptoms should change or progress, she will likely need to be set up for cardiac catheterization. 2. Coronary Artery Disease:  Continue ASA, Effien, Statin. 3. Hyperlipidemia:  Continue statin. 4. Disposition:  Follow up with Dr. Olga Millers or me in 2-3 weeks.   SignedTereso Newcomer, PA-C  10:06 AM 05/22/2012

## 2012-05-25 ENCOUNTER — Encounter (HOSPITAL_COMMUNITY): Payer: Self-pay

## 2012-05-27 ENCOUNTER — Encounter (HOSPITAL_COMMUNITY): Payer: Self-pay

## 2012-05-29 ENCOUNTER — Encounter (HOSPITAL_COMMUNITY): Payer: Self-pay

## 2012-06-01 ENCOUNTER — Encounter (HOSPITAL_COMMUNITY): Payer: Self-pay

## 2012-06-01 LAB — HM MAMMOGRAPHY

## 2012-06-03 ENCOUNTER — Encounter (HOSPITAL_COMMUNITY): Payer: Self-pay

## 2012-06-05 ENCOUNTER — Ambulatory Visit
Admission: RE | Admit: 2012-06-05 | Discharge: 2012-06-05 | Disposition: A | Discharge status: Home / Self Care | Payer: 59 | Source: Ambulatory Visit | Attending: Family Medicine | Admitting: Family Medicine

## 2012-06-05 ENCOUNTER — Encounter (HOSPITAL_COMMUNITY): Payer: Self-pay

## 2012-06-05 DIAGNOSIS — Z9882 Breast implant status: Secondary | ICD-10-CM

## 2012-06-05 DIAGNOSIS — Z1231 Encounter for screening mammogram for malignant neoplasm of breast: Secondary | ICD-10-CM

## 2012-06-08 ENCOUNTER — Encounter (HOSPITAL_COMMUNITY): Payer: Self-pay

## 2012-06-08 DIAGNOSIS — I2510 Atherosclerotic heart disease of native coronary artery without angina pectoris: Secondary | ICD-10-CM | POA: Insufficient documentation

## 2012-06-08 DIAGNOSIS — Z5189 Encounter for other specified aftercare: Secondary | ICD-10-CM | POA: Insufficient documentation

## 2012-06-08 DIAGNOSIS — Z9861 Coronary angioplasty status: Secondary | ICD-10-CM | POA: Insufficient documentation

## 2012-06-08 DIAGNOSIS — I447 Left bundle-branch block, unspecified: Secondary | ICD-10-CM | POA: Insufficient documentation

## 2012-06-08 DIAGNOSIS — I200 Unstable angina: Secondary | ICD-10-CM | POA: Insufficient documentation

## 2012-06-08 DIAGNOSIS — E785 Hyperlipidemia, unspecified: Secondary | ICD-10-CM | POA: Insufficient documentation

## 2012-06-08 DIAGNOSIS — R000 Tachycardia, unspecified: Secondary | ICD-10-CM | POA: Insufficient documentation

## 2012-06-10 ENCOUNTER — Encounter (HOSPITAL_COMMUNITY): Payer: Self-pay

## 2012-06-12 ENCOUNTER — Ambulatory Visit (INDEPENDENT_AMBULATORY_CARE_PROVIDER_SITE_OTHER): Payer: 59 | Admitting: Cardiology

## 2012-06-12 ENCOUNTER — Encounter: Payer: Self-pay | Admitting: Cardiology

## 2012-06-12 ENCOUNTER — Encounter (HOSPITAL_COMMUNITY)
Admission: RE | Admit: 2012-06-12 | Discharge: 2012-06-12 | Disposition: A | Discharge status: Home / Self Care | Payer: Self-pay | Source: Ambulatory Visit | Attending: Cardiology | Admitting: Cardiology

## 2012-06-12 VITALS — BP 100/60 | HR 60 | Ht 64.0 in | Wt 138.4 lb

## 2012-06-12 DIAGNOSIS — R079 Chest pain, unspecified: Secondary | ICD-10-CM

## 2012-06-12 DIAGNOSIS — I2510 Atherosclerotic heart disease of native coronary artery without angina pectoris: Secondary | ICD-10-CM

## 2012-06-12 DIAGNOSIS — E785 Hyperlipidemia, unspecified: Secondary | ICD-10-CM

## 2012-06-12 NOTE — Assessment & Plan Note (Signed)
Continue statin. 

## 2012-06-12 NOTE — Progress Notes (Signed)
HPI: Pleasant female for followup of coronary disease. Previously seen for chest pain. Stress echo was markedly abnormal with nonsustained ventricular tachycardia and ischemia in the anteroseptal wall and apex. Patient underwent cardiac catheterization in September of 2012 which revealed a 95% ostial LAD lesion. The patient had PCI with a drug-eluting stent. There was some jailing of the circumflex. There was recommendation for aspirin indefinitely and effient for at least one year and possibly indefinitely. Repeat cath at [facility] in Dec 2012 because of palpitations and chest pain revealed patency of LAD stent and she had no other significant CAD (Proximal RCA 10%, left main 20%, small OM1 50%, mid LAD 20%, D2 20%). Patient with significant radial spasm during the procedure. She was given Toprol-XL 25 mg daily at discharge. TSH was 2.27. Had event monitor placed in Jan 2013 that showed sinus rhythm. Carotid Dopplers December 2012 showed 0-39% bilateral stenosis. 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 Myoview in November of 2013 showed an ejection fraction of 73%. There was no ischemia. Patient again seen in January of 2014 by Tereso Newcomer with recurrent chest pain. Cardiac catheterization discussed but she declined. Since she was last seen, she continues to have occasional pain in the right scapular area and occasional substernal area. This increases with stress. It lasts seconds and resolve spontaneously. With stress she also feels heaviness in her chest. She occasionally has dyspnea on exertion but there is no orthopnea, PND or pedal edema. Her symptoms have improved since Toprol was initiated.   Current Outpatient Prescriptions  Medication Sig Dispense Refill  . aspirin 81 MG tablet Take 81 mg by mouth daily.        . CRESTOR 20 MG tablet TAKE 1 TABLET EVERY NIGHT  90 tablet  2  . EFFIENT 10 MG TABS TAKE 1 TABLET DAILY  3 tablet  2  . metoprolol  succinate (TOPROL XL) 25 MG 24 hr tablet Take 0.5 tablets (12.5 mg total) by mouth daily.  15 tablet  11  . nitroGLYCERIN (NITROSTAT) 0.4 MG SL tablet Place 1 tablet (0.4 mg total) under the tongue every 5 (five) minutes as needed for chest pain.  25 tablet  12     Past Medical History  Diagnosis Date  . ALLERGIC RHINITIS 03/02/2007  . BREAST CYST, LEFT 11/23/2009  . 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 CAD (coronary artery disease)     s/p DES to LAD 9/12  . HLD (hyperlipidemia)     Past Surgical History  Procedure Date  . Breast surgery     BILAT IMPLANTS  . Breast biopsy     History   Social History  . Marital Status: Married    Spouse Name: N/A    Number of Children: N/A  . 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 Smoking status: Never Smoker   . Smokeless tobacco: Never Used  . Alcohol Use: Yes     Comment: Occasional  . Drug Use: No  . Sexually Active: Not on file   Other Topics Concern  . Not on file   Social History Narrative   Occupation: Never SmokedAlcohol use- noMarriedDrug use- noRegular Exercise- yes     ROS: no fevers or chills, productive cough, hemoptysis, dysphasia, odynophagia, melena, hematochezia, dysuria, hematuria, rash, seizure activity, orthopnea, PND, pedal edema, claudication. Remaining systems are negative.  Physical  Exam: Well-developed well-nourished in no acute distress.  Skin is warm and dry.  HEENT is normal.  Neck is supple.  Chest is clear to auscultation with normal expansion.  Cardiovascular exam is regular rate and rhythm.  Abdominal exam nontender or distended. No masses palpated. Extremities show no edema. neuro grossly intact  ECG-sinus rhythm at a rate of 60. No ST changes.

## 2012-06-12 NOTE — Assessment & Plan Note (Signed)
Symptoms are extremely atypical and difficult to assess. However her symptoms were very atypical prior to her PCI of her LAD. Previous catheterization in December of 2012 at [facility] showed no restenosis. Recent functional study negative. We discussed repeating cardiac catheterization. However she declined at this point. We will consider in the future if her symptoms worsen. Continue Toprol. Many of her symptoms appear to be stress related.

## 2012-06-12 NOTE — Patient Instructions (Addendum)
Your physician recommends that you schedule a follow-up appointment in: 3 MONTHS WITH DR Vickii Chafe

## 2012-06-12 NOTE — Assessment & Plan Note (Addendum)
Continue aspirin and statin. Will continue effient for 18 months following PCI (previous recommendation was at least 12 months and potentially indefinitely).

## 2012-06-15 ENCOUNTER — Encounter (HOSPITAL_COMMUNITY): Payer: Self-pay

## 2012-06-17 ENCOUNTER — Encounter (HOSPITAL_COMMUNITY): Payer: Self-pay

## 2012-06-19 ENCOUNTER — Encounter (HOSPITAL_COMMUNITY): Payer: Self-pay

## 2012-06-22 ENCOUNTER — Encounter (HOSPITAL_COMMUNITY): Admission: RE | Admit: 2012-06-22 | Payer: Self-pay | Source: Ambulatory Visit

## 2012-06-24 ENCOUNTER — Encounter (HOSPITAL_COMMUNITY): Payer: Self-pay

## 2012-06-24 ENCOUNTER — Ambulatory Visit (INDEPENDENT_AMBULATORY_CARE_PROVIDER_SITE_OTHER): Payer: 59 | Admitting: Internal Medicine

## 2012-06-24 ENCOUNTER — Encounter: Payer: Self-pay | Admitting: Internal Medicine

## 2012-06-24 VITALS — BP 102/70 | Temp 97.9°F | Wt 139.0 lb

## 2012-06-24 DIAGNOSIS — J329 Chronic sinusitis, unspecified: Secondary | ICD-10-CM

## 2012-06-24 DIAGNOSIS — R6883 Chills (without fever): Secondary | ICD-10-CM

## 2012-06-24 MED ORDER — ERYTHROMYCIN 5 MG/GM OP OINT: TOPICAL_OINTMENT | Freq: Four times a day (QID) | OPHTHALMIC | Status: AC

## 2012-06-24 MED ORDER — AMOXICILLIN 875 MG PO TABS: 875.0000 mg | ORAL_TABLET | Freq: Two times a day (BID) | ORAL | Status: DC

## 2012-06-24 NOTE — Assessment & Plan Note (Signed)
57 year old white female with 2 weeks of sinus congestion and cough. Symptoms consistent with possible maxillary sinusitis. Treat with amoxicillin 875 mg twice daily for 10 days. She also complains of crusty discharge in eyes bilaterally. Treat with erythromycin ophthalmic ointment every 6 hours for one week.  Patient advised to call office if symptoms persist or worsen.

## 2012-06-24 NOTE — Patient Instructions (Addendum)
Please contact our office if your symptoms do not improve or gets worse.  

## 2012-06-24 NOTE — Progress Notes (Signed)
Subjective:    Patient ID: Angelica [name], female    DOB: 11-Nov-1955, 57 y.o.   MRN: [id number]  HPI  57 year old white female with history of coronary artery disease complains of fatigue, headache, sinus congestion and cough for last 2 weeks. Patient thought she might have the flu when symptoms initially started. She is starting to feel better but she is coughing up yellow / green mucus.  She denies fever or chills. She also crusty discharge in both eyes especially in the morning. She denies any eye pain or changes in vision.  Review of Systems Negative for shortness of breath  Past Medical History  Diagnosis Date  . ALLERGIC RHINITIS 03/02/2007  . BREAST CYST, LEFT 11/23/2009  . 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 CAD (coronary artery disease)     s/p DES to LAD 9/12  . HLD (hyperlipidemia)     History   Social History  . Marital Status: Married    Spouse Name: N/A    Number of Children: N/A  . 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 Smoking status: Never Smoker   . Smokeless tobacco: Never Used  . Alcohol Use: Yes     Comment: Occasional  . Drug Use: No  . Sexually Active: Not on file   Other Topics Concern  . Not on file   Social History Narrative   Occupation:    Never Smoked   Alcohol use- no   Married   Drug use- no   Regular Exercise- yes     Past Surgical History  Procedure Laterality Date  . Breast surgery      BILAT IMPLANTS  . Breast biopsy      Family History  Problem Relation Age of Onset  . Alcohol abuse Other   . Diabetes Other   . Hypertension Other   . Heart disease Other     Allergies  Allergen Reactions  . Prednisone Palpitations    Current Outpatient Prescriptions on File Prior to Visit  Medication Sig Dispense Refill  . aspirin 81 MG tablet Take 81 mg by mouth daily.        . CRESTOR 20 MG tablet TAKE 1 TABLET EVERY NIGHT  90 tablet  2  .  EFFIENT 10 MG TABS TAKE 1 TABLET DAILY  3 tablet  2  . metoprolol succinate (TOPROL XL) 25 MG 24 hr tablet Take 0.5 tablets (12.5 mg total) by mouth daily.  15 tablet  11  . nitroGLYCERIN (NITROSTAT) 0.4 MG SL tablet Place 1 tablet (0.4 mg total) under the tongue every 5 (five) minutes as needed for chest pain.  25 tablet  12   No current facility-administered medications on file prior to visit.    BP 102/70  Temp(Src) 97.9 F (36.6 C) (Oral)  Wt 139 lb (63.05 kg)  BMI 23.85 kg/m2       Objective:   Physical Exam  Constitutional: She is oriented to person, place, and time. She appears well-developed and well-nourished. No distress.  HENT:  Head: Normocephalic and atraumatic.  Right Ear: External ear normal.  Left Ear: External ear normal.  Mouth/Throat: No oropharyngeal exudate.  Oropharyngeal erythema  Eyes: EOM are normal. Pupils are equal, round, and reactive to light.  Mild left conjunctival injection  Neck: Neck supple.  No neck tenderness  Cardiovascular: Normal rate, regular rhythm and normal heart sounds.   Pulmonary/Chest:  Effort normal and breath sounds normal. She has no wheezes.  Lymphadenopathy:    She has no cervical adenopathy.  Neurological: She is alert and oriented to person, place, and time. No cranial nerve deficit.  Psychiatric: She has a normal mood and affect. Her behavior is normal.          Assessment & Plan:

## 2012-06-26 ENCOUNTER — Encounter (HOSPITAL_COMMUNITY): Payer: Self-pay

## 2012-06-29 ENCOUNTER — Encounter (HOSPITAL_COMMUNITY): Payer: Self-pay

## 2012-07-01 ENCOUNTER — Encounter (HOSPITAL_COMMUNITY): Payer: Self-pay

## 2012-07-03 ENCOUNTER — Encounter (HOSPITAL_COMMUNITY): Payer: Self-pay

## 2012-07-06 ENCOUNTER — Encounter (HOSPITAL_COMMUNITY): Payer: Self-pay

## 2012-07-06 DIAGNOSIS — I447 Left bundle-branch block, unspecified: Secondary | ICD-10-CM | POA: Insufficient documentation

## 2012-07-06 DIAGNOSIS — I2510 Atherosclerotic heart disease of native coronary artery without angina pectoris: Secondary | ICD-10-CM | POA: Insufficient documentation

## 2012-07-06 DIAGNOSIS — I200 Unstable angina: Secondary | ICD-10-CM | POA: Insufficient documentation

## 2012-07-06 DIAGNOSIS — R000 Tachycardia, unspecified: Secondary | ICD-10-CM | POA: Insufficient documentation

## 2012-07-06 DIAGNOSIS — Z5189 Encounter for other specified aftercare: Secondary | ICD-10-CM | POA: Insufficient documentation

## 2012-07-06 DIAGNOSIS — Z9861 Coronary angioplasty status: Secondary | ICD-10-CM | POA: Insufficient documentation

## 2012-07-06 DIAGNOSIS — E785 Hyperlipidemia, unspecified: Secondary | ICD-10-CM | POA: Insufficient documentation

## 2012-07-08 ENCOUNTER — Encounter (HOSPITAL_COMMUNITY)
Admission: RE | Admit: 2012-07-08 | Discharge: 2012-07-08 | Disposition: A | Discharge status: Home / Self Care | Payer: Self-pay | Source: Ambulatory Visit | Attending: Cardiology | Admitting: Cardiology

## 2012-07-10 ENCOUNTER — Encounter (HOSPITAL_COMMUNITY): Payer: Self-pay

## 2012-07-13 ENCOUNTER — Encounter (HOSPITAL_COMMUNITY): Payer: Self-pay

## 2012-07-15 ENCOUNTER — Encounter (HOSPITAL_COMMUNITY): Payer: Self-pay

## 2012-07-17 ENCOUNTER — Ambulatory Visit (INDEPENDENT_AMBULATORY_CARE_PROVIDER_SITE_OTHER): Payer: 59 | Admitting: Family Medicine

## 2012-07-17 ENCOUNTER — Encounter: Payer: Self-pay | Admitting: Family Medicine

## 2012-07-17 ENCOUNTER — Encounter (HOSPITAL_COMMUNITY)
Admission: RE | Admit: 2012-07-17 | Discharge: 2012-07-17 | Disposition: A | Discharge status: Home / Self Care | Payer: Self-pay | Source: Ambulatory Visit | Attending: Cardiology | Admitting: Cardiology

## 2012-07-17 VITALS — BP 98/70 | HR 74 | Temp 98.6°F | Wt 139.0 lb

## 2012-07-17 DIAGNOSIS — R300 Dysuria: Secondary | ICD-10-CM

## 2012-07-17 DIAGNOSIS — B373 Candidiasis of vulva and vagina: Secondary | ICD-10-CM

## 2012-07-17 DIAGNOSIS — B3731 Acute candidiasis of vulva and vagina: Secondary | ICD-10-CM

## 2012-07-17 LAB — POCT URINALYSIS DIPSTICK
Ketones, UA: NEGATIVE
Protein, UA: NEGATIVE
Spec Grav, UA: 1.01

## 2012-07-17 MED ORDER — FLUCONAZOLE 150 MG PO TABS: 150.0000 mg | ORAL_TABLET | Freq: Once | ORAL | Status: DC

## 2012-07-17 NOTE — Progress Notes (Signed)
Chief Complaint  Patient presents with  . Urinary Frequency    HPI:  ? UTI: -started 1-2 weeks ago -symptoms: urinary frequency, urgency, a little discomfort in lower bladder area, sometime discomfort in lower back -denies: fevers, vomiting, chills, GI changes, weight loss, vaginal discharge -has had a little vulvovaginal pruritis and a little burning with urination - did take ampicillin recently for URI  ROS: See pertinent positives and negatives per HPI.  Past Medical History  Diagnosis Date  . ALLERGIC RHINITIS 03/02/2007  . BREAST CYST, LEFT 11/23/2009  . 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 CAD (coronary artery disease)     s/p DES to LAD 9/12  . HLD (hyperlipidemia)     Family History  Problem Relation Age of Onset  . Alcohol abuse Other   . Diabetes Other   . Hypertension Other   . Heart disease Other     History   Social History  . Marital Status: Married    Spouse Name: N/A    Number of Children: N/A  . 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 Smoking status: Never Smoker   . Smokeless tobacco: Never Used  . Alcohol Use: Yes     Comment: Occasional  . Drug Use: No  . Sexually Active: None   Other Topics Concern  . None   Social History Narrative   Occupation:    Never Smoked   Alcohol use- no   Married   Drug use- no   Regular Exercise- yes     Current outpatient prescriptions:amoxicillin (AMOXIL) 875 MG tablet, Take 1 tablet (875 mg total) by mouth 2 (two) times daily., Disp: 20 tablet, Rfl: 0;  aspirin 81 MG tablet, Take 81 mg by mouth daily.  , Disp: , Rfl: ;  CRESTOR 20 MG tablet, TAKE 1 TABLET EVERY NIGHT, Disp: 90 tablet, Rfl: 2;  EFFIENT 10 MG TABS, TAKE 1 TABLET DAILY, Disp: 3 tablet, Rfl: 2 metoprolol succinate (TOPROL XL) 25 MG 24 hr tablet, Take 0.5 tablets (12.5 mg total) by mouth daily., Disp: 15 tablet, Rfl: 11;  nitroGLYCERIN (NITROSTAT) 0.4 MG SL tablet, Place  1 tablet (0.4 mg total) under the tongue every 5 (five) minutes as needed for chest pain., Disp: 25 tablet, Rfl: 12;  fluconazole (DIFLUCAN) 150 MG tablet, Take 1 tablet (150 mg total) by mouth once., Disp: 1 tablet, Rfl: 0  EXAM:  Filed Vitals:   07/17/12 1308  BP: 98/70  Pulse: 74  Temp: 98.6 F (37 C)    Body mass index is 23.85 kg/(m^2).  GENERAL: vitals reviewed and listed above, alert, oriented, appears well hydrated and in no acute distress  HEENT: atraumatic, conjunttiva clear, no obvious abnormalities on inspection of external nose and ears  NECK: no obvious masses on inspection  ABD: BS+, soft, NTTP, no CVA TTP  MS: moves all extremities without noticeable abnormality  PSYCH: pleasant and cooperative, no obvious depression or anxiety  ASSESSMENT AND PLAN:  Discussed the following assessment and plan:  Vulvovaginal candidiasis - Plan: fluconazole (DIFLUCAN) 150 MG tablet  Dysuria - Plan: POCT urinalysis dipstick, Culture, Urine  -urine dip clear, benign abd exam - will tx for likely yeast in and she is seeing her gyn doc soon and will mention symptoms if remain after tx, culture pending -Patient advised to return or notify a doctor immediately if symptoms worsen or persist or new concerns arise.  There are no Patient Instructions on file for this visit.   Colin Benton R.

## 2012-07-19 ENCOUNTER — Telehealth: Payer: Self-pay | Admitting: Family Medicine

## 2012-07-19 LAB — URINE CULTURE: Colony Count: NO GROWTH

## 2012-07-19 NOTE — Telephone Encounter (Signed)
Urine culture was normal

## 2012-07-20 ENCOUNTER — Encounter (HOSPITAL_COMMUNITY): Payer: Self-pay

## 2012-07-20 NOTE — Telephone Encounter (Signed)
Left a message for return call.  

## 2012-07-21 ENCOUNTER — Other Ambulatory Visit: Payer: Self-pay | Admitting: *Deleted

## 2012-07-21 DIAGNOSIS — N950 Postmenopausal bleeding: Secondary | ICD-10-CM

## 2012-07-21 NOTE — Telephone Encounter (Signed)
Pt returned call and left a vm stating to leave a message if pt did not pick up call.  Called pt's cell phone number and left vm.

## 2012-07-22 ENCOUNTER — Encounter (HOSPITAL_COMMUNITY)
Admission: RE | Admit: 2012-07-22 | Discharge: 2012-07-22 | Disposition: A | Discharge status: Home / Self Care | Payer: Self-pay | Source: Ambulatory Visit | Attending: Cardiology | Admitting: Cardiology

## 2012-07-23 ENCOUNTER — Encounter: Payer: Self-pay | Admitting: Cardiology

## 2012-07-24 ENCOUNTER — Encounter (HOSPITAL_COMMUNITY): Payer: Self-pay

## 2012-07-27 ENCOUNTER — Encounter (HOSPITAL_COMMUNITY): Payer: Self-pay

## 2012-07-28 ENCOUNTER — Other Ambulatory Visit: Payer: Self-pay

## 2012-07-29 ENCOUNTER — Encounter (HOSPITAL_COMMUNITY): Payer: Self-pay

## 2012-07-30 ENCOUNTER — Other Ambulatory Visit: Payer: Self-pay | Admitting: Obstetrics and Gynecology

## 2012-07-30 DIAGNOSIS — N950 Postmenopausal bleeding: Secondary | ICD-10-CM

## 2012-07-31 ENCOUNTER — Encounter (HOSPITAL_COMMUNITY): Payer: Self-pay

## 2012-07-31 ENCOUNTER — Other Ambulatory Visit: Payer: Self-pay

## 2012-08-03 ENCOUNTER — Encounter (HOSPITAL_COMMUNITY)
Admission: RE | Admit: 2012-08-03 | Discharge: 2012-08-03 | Disposition: A | Discharge status: Home / Self Care | Payer: Self-pay | Source: Ambulatory Visit | Attending: Cardiology | Admitting: Cardiology

## 2012-08-03 ENCOUNTER — Telehealth: Payer: Self-pay | Admitting: Obstetrics and Gynecology

## 2012-08-03 NOTE — Telephone Encounter (Signed)
Notified of dr Rica Records recommendation and appointment moved up to 08-05-12 at 1030.  Instructed to go to ER if increased pain or heavy bleeding. Agreeable.

## 2012-08-03 NOTE — Telephone Encounter (Signed)
Patient states is scheduled for pelvic ultra sound on April 7th . Past 72 hours has had pain in left to middle stabbing pain. Was able to do cardiac rehab this am. No  Bright red bleeding noted by patient. Please advise. sue

## 2012-08-03 NOTE — Telephone Encounter (Signed)
Let's try to do the saline ultrasound this week instead.  If develops significant pain or bleeding, needs to go to the emergency department.

## 2012-08-03 NOTE — Telephone Encounter (Signed)
Patient is having some pain (no details given) Pt would like to talk with a nurse.

## 2012-08-05 ENCOUNTER — Encounter (HOSPITAL_COMMUNITY): Payer: Self-pay

## 2012-08-05 ENCOUNTER — Ambulatory Visit (INDEPENDENT_AMBULATORY_CARE_PROVIDER_SITE_OTHER): Payer: 59 | Admitting: Obstetrics and Gynecology

## 2012-08-05 ENCOUNTER — Encounter: Payer: Self-pay | Admitting: Obstetrics and Gynecology

## 2012-08-05 ENCOUNTER — Ambulatory Visit (INDEPENDENT_AMBULATORY_CARE_PROVIDER_SITE_OTHER): Payer: 59

## 2012-08-05 DIAGNOSIS — N951 Menopausal and female climacteric states: Secondary | ICD-10-CM

## 2012-08-05 DIAGNOSIS — I200 Unstable angina: Secondary | ICD-10-CM | POA: Insufficient documentation

## 2012-08-05 DIAGNOSIS — I447 Left bundle-branch block, unspecified: Secondary | ICD-10-CM | POA: Insufficient documentation

## 2012-08-05 DIAGNOSIS — R000 Tachycardia, unspecified: Secondary | ICD-10-CM | POA: Insufficient documentation

## 2012-08-05 DIAGNOSIS — E785 Hyperlipidemia, unspecified: Secondary | ICD-10-CM | POA: Insufficient documentation

## 2012-08-05 DIAGNOSIS — Z5189 Encounter for other specified aftercare: Secondary | ICD-10-CM | POA: Insufficient documentation

## 2012-08-05 DIAGNOSIS — Z9861 Coronary angioplasty status: Secondary | ICD-10-CM | POA: Insufficient documentation

## 2012-08-05 DIAGNOSIS — N950 Postmenopausal bleeding: Secondary | ICD-10-CM

## 2012-08-05 DIAGNOSIS — I2510 Atherosclerotic heart disease of native coronary artery without angina pectoris: Secondary | ICD-10-CM | POA: Insufficient documentation

## 2012-08-05 NOTE — Progress Notes (Signed)
Sonohysterogram and Endometrial Biopsy  History  Patient states had regular menses until 2012 when periods stopped.  At almost the year mark, had what she considered a normal period in April and May 2013.  Patient was worried by a friend's medical history of a delayed diagnosis of endometrial cancer so she sought care at [facility].  Had an attempted endometrial biopsy and had significant bleeding.  Patient unclear if this was due to her Effient antiplatelet medication.  Biopsy was not completed.  Patient now presents to complete her evaluation.    States had light vaginal bleeding for one day on 06/06/12 and them last week for 2 - 3 days with dark small clots and cramping.       Procedure - Sonohysterogram  Consent verbally performed.  Speculum in vagina.  Sterile prep of cervix with betadine solution.  Tenaculum to anterior cervix.  Metal os finder used to find cervical canal.  Cervical catheter inserted sterily and saline infused.  Images obtained.  Instruments removed.  No complications.  Procedure - Endometrial Biopsy    Consent signed.  Speculum in vagina.  Sterile prep of cervix with hibiclens.  Tenaculum to anterior cervix.  Yellow os finder used to find cervical canal.  Pipelle passed one time to 6 cm without difficulty.  Tissue to pathology.  No complications  Assessment Perimenopausal menstrual cycles.  Plan Follow up biopsy. Keep menstrual calendar.

## 2012-08-05 NOTE — Patient Instructions (Signed)
Perimenopause Perimenopause is the time when your body begins to move into the menopause (no menstrual period for 12 straight months). It is a natural process. Perimenopause can begin 2 to 8 years before the menopause and usually lasts for one year after the menopause. During this time, your ovaries may or may not produce an egg. The ovaries vary in their production of estrogen and progesterone hormones each month. This can cause irregular menstrual periods, difficulty in getting pregnant, vaginal bleeding between periods and uncomfortable symptoms. CAUSES  Irregular production of the ovarian hormones, estrogen and progesterone, and not ovulating every month.  Other causes include:  Tumor of the pituitary gland in the brain.  Medical disease that affects the ovaries.  Radiation treatment.  Chemotherapy.  Unknown causes.  Heavy smoking and excessive alcohol intake can bring on perimenopause sooner. SYMPTOMS   Hot flashes.  Night sweats.  Irregular menstrual periods.  Decrease sex drive.  Vaginal dryness.  Headaches.  Mood swings.  Depression.  Memory problems.  Irritability.  Tiredness.  Weight gain.  Trouble getting pregnant.  The beginning of losing bone cells (osteoporosis).  The beginning of hardening of the arteries (atherosclerosis). DIAGNOSIS  Your caregiver will make a diagnosis by analyzing your age, menstrual history and your symptoms. They will do a physical exam noting any changes in your body, especially your female organs. Female hormone tests may or may not be helpful depending on the amount and when you produce the female hormones. However, other hormone tests may be helpful (ex. thyroid hormone) to rule out other problems. TREATMENT  The decision to treat during the perimenopause should be made by you and your caregiver depending on how the symptoms are affecting you and your life style. There are various treatments available such as:  Treating  individual symptoms with a specific medication for that symptom (ex. tranquilizer for depression).  Herbal medications that can help specific symptoms.  Counseling.  Group therapy.  No treatment. HOME CARE INSTRUCTIONS   Before seeing your caregiver, make a list of your menstrual periods (when the occur, how heavy they are, how long between periods and how long they last), your symptoms and when they started.  Take the medication as recommended by your caregiver.  Sleep and rest.  Exercise.  Eat a diet that contains calcium (good for your bones) and soy (acts like estrogen hormone).  Do not smoke.  Avoid alcoholic beverages.  Taking vitamin E may help in certain cases.  Take calcium and vitamin D supplements to help prevent bone loss.  Group therapy is sometimes helpful.  Acupuncture may help in some cases. SEEK MEDICAL CARE IF:   You have any of the above and want to know if it is perimenopause.  You want advice and treatment for any of your symptoms mentioned above.  You need a referral to a specialist (gynecologist, psychiatrist or psychologist). SEEK IMMEDIATE MEDICAL CARE IF:   You have vaginal bleeding.  Your period lasts longer than 8 days.  You periods are recurring sooner than 21 days.  You have bleeding after intercourse.  You have severe depression.  You have pain when you urinate.  You have severe headaches.  You develop vision problems. Document Released: 05/30/2004 Document Revised: 07/15/2011 Document Reviewed: 02/18/2008 [employer] Patient Information 2013 [employer].  

## 2012-08-05 NOTE — Addendum Note (Signed)
Addended by: Conley Simmonds on: 08/05/2012 02:34 PM   Modules accepted: Orders

## 2012-08-07 ENCOUNTER — Encounter (HOSPITAL_COMMUNITY): Payer: Self-pay

## 2012-08-10 ENCOUNTER — Encounter (HOSPITAL_COMMUNITY): Payer: Self-pay

## 2012-08-10 ENCOUNTER — Other Ambulatory Visit: Payer: Self-pay | Admitting: Obstetrics and Gynecology

## 2012-08-10 ENCOUNTER — Other Ambulatory Visit: Payer: Self-pay

## 2012-08-12 ENCOUNTER — Telehealth: Payer: Self-pay | Admitting: Obstetrics and Gynecology

## 2012-08-12 ENCOUNTER — Telehealth: Payer: Self-pay | Admitting: Cardiology

## 2012-08-12 ENCOUNTER — Telehealth: Payer: Self-pay | Admitting: Family Medicine

## 2012-08-12 ENCOUNTER — Encounter (HOSPITAL_COMMUNITY): Payer: Self-pay

## 2012-08-12 NOTE — Telephone Encounter (Signed)
Dr Edward Jolly,   This pt called wanting her bx results. Do you want me to tell her they were benign--nothing cancerous or atypical?  Thanks,  AA

## 2012-08-12 NOTE — Telephone Encounter (Signed)
14 days of samples left at the front desk.  Pt was notified.

## 2012-08-12 NOTE — Telephone Encounter (Signed)
Pt called and stated that she was having consistent pain in her right side and the right side of her stomach. She wants to be seen tomorrow, but Dr. Tawanna Cooler has no openings. Please assist. She also stated that beginning Thursday morning you can reach her at [phone].

## 2012-08-12 NOTE — Telephone Encounter (Signed)
New Problem:    Patient called in wanting to know if there were any samples of EFFIENT 10 MG TABS available for her to have because her mail order is late in arriving to her home.  Please call back.

## 2012-08-12 NOTE — Telephone Encounter (Signed)
Spoke with pt to relay message from Dr Edward Jolly. Instructed pt her bx results were normal, showing the estrogen effect on the endometrium. Instructed pt to keep a calendar of any bleeding or spotting and to bring it to her next appt. Pt agreeable.  aa

## 2012-08-12 NOTE — Telephone Encounter (Signed)
Please inform patient of normal result.  I have just sent you a quick note regarding this biopsy.

## 2012-08-13 ENCOUNTER — Encounter: Payer: Self-pay | Admitting: *Deleted

## 2012-08-13 ENCOUNTER — Telehealth: Payer: Self-pay | Admitting: Cardiology

## 2012-08-13 NOTE — Telephone Encounter (Signed)
Patient coming in tomorrow.

## 2012-08-13 NOTE — Telephone Encounter (Signed)
Pt here to pick up samples, she cont to have a stabbing pain in her back, chest and occ left arm. She c/o fatigue and nausea all the time. She is able to eat. She has a follow up appt with dr todd tomorrow. She has seen the GYN and they cleared any kind of issue there. She is working 20 hours in a day and reports getting 300 e-mails in the day. She has been feeling bad for a couple months now. She is not sure what to do next. She will call me tomorrow after seeing dr todd and I will discuss the pt with dr Jens Som tomorrow

## 2012-08-13 NOTE — Progress Notes (Signed)
This encounter was created in error - please disregard.

## 2012-08-14 ENCOUNTER — Telehealth: Payer: Self-pay | Admitting: Family Medicine

## 2012-08-14 ENCOUNTER — Ambulatory Visit (INDEPENDENT_AMBULATORY_CARE_PROVIDER_SITE_OTHER): Payer: 59 | Admitting: Family Medicine

## 2012-08-14 ENCOUNTER — Encounter (HOSPITAL_COMMUNITY)
Admission: RE | Admit: 2012-08-14 | Discharge: 2012-08-14 | Disposition: A | Discharge status: Home / Self Care | Payer: Self-pay | Source: Ambulatory Visit | Attending: Cardiology | Admitting: Cardiology

## 2012-08-14 ENCOUNTER — Encounter: Payer: Self-pay | Admitting: Family Medicine

## 2012-08-14 VITALS — BP 140/90 | Temp 98.1°F | Wt 139.0 lb

## 2012-08-14 DIAGNOSIS — I2510 Atherosclerotic heart disease of native coronary artery without angina pectoris: Secondary | ICD-10-CM

## 2012-08-14 DIAGNOSIS — M549 Dorsalgia, unspecified: Secondary | ICD-10-CM

## 2012-08-14 DIAGNOSIS — N950 Postmenopausal bleeding: Secondary | ICD-10-CM

## 2012-08-14 LAB — POCT URINALYSIS DIPSTICK
Bilirubin, UA: NEGATIVE
Blood, UA: NEGATIVE
Glucose, UA: NEGATIVE
Spec Grav, UA: 1.015

## 2012-08-14 LAB — HEPATIC FUNCTION PANEL
Albumin: 4.5 g/dL (ref 3.5–5.2)
Bilirubin, Direct: 0.1 mg/dL (ref 0.0–0.3)
Total Protein: 7.7 g/dL (ref 6.0–8.3)

## 2012-08-14 LAB — BASIC METABOLIC PANEL
BUN: 21 mg/dL (ref 6–23)
Chloride: 101 mEq/L (ref 96–112)
Glucose, Bld: 85 mg/dL (ref 70–99)
Potassium: 4 mEq/L (ref 3.5–5.1)
Sodium: 134 mEq/L — ABNORMAL LOW (ref 135–145)

## 2012-08-14 LAB — CBC WITH DIFFERENTIAL/PLATELET
Basophils Relative: 0.8 % (ref 0.0–3.0)
Eosinophils Relative: 2.6 % (ref 0.0–5.0)
Hemoglobin: 13.7 g/dL (ref 12.0–15.0)
Lymphocytes Relative: 29.7 % (ref 12.0–46.0)
MCHC: 34.1 g/dL (ref 30.0–36.0)
Monocytes Relative: 8 % (ref 3.0–12.0)
Neutro Abs: 4.5 10*3/uL (ref 1.4–7.7)
RBC: 4.07 Mil/uL (ref 3.87–5.11)
WBC: 7.6 10*3/uL (ref 4.5–10.5)

## 2012-08-14 NOTE — Telephone Encounter (Signed)
Dr. Tawanna Cooler, please call Dr. Jens Som (patients cardiologist) per Dr. Jens Som. Thank you

## 2012-08-14 NOTE — Telephone Encounter (Signed)
Spoke with pt, discussed with dr Jens Som and pcp note from today reviewed. We do recommend cath. She will see lori gerhardt np for workup for cath. Pt to call prior to appt with change in symptoms.

## 2012-08-14 NOTE — Progress Notes (Signed)
  Subjective:    Patient ID: Angelica [name], female    DOB: April 30, 1956, 57 y.o.   MRN: [id number]  HPI Angelica [name] is a 57 year old married female nonsmoker who comes in today for evaluation of back pain  She states about 2 months ago she began having back pain and she points to her posterior right hip. She describes the pain is intermittent. It sometimes lasts for a few minutes sometimes lasts for hours. No history of trauma. Sometimes the pain is sharp sometimes dull. She rates the pain as a 3 on a scale of 1-10. The pain tends to radiate to her right lower quadrant. She does have some nausea with the pain but the pain is not related eating. She has no fever chills, vomiting diarrhea change in urinary habits or bowel habits. She had a colonoscopy a couple years ago was normal. She recently had a GYN workup by Dr. Edward Jolly for postmenopausal bleeding. Pelvic ultrasound showed normal uterus and ovaries biopsy was negative.  She had a stent put in a couple years ago,,,,,,,,, LAD,,,,,,,,, also recalls a 30% lesion and another blood vessel. She recently has been having some back pain and that's how her coronary artery disease presented initially. I would recommend she be recatheterized. I will discuss this with her cardiologist   Review of Systems Review of systems otherwise negative no fever chills weight loss vomiting diarrhea change in bowel habits urinary habits bloating etc.  Family history noncontributory    Objective:   Physical Exam  Well-developed well-nourished female no acute distress examination of back shows the spine to be normal. There is no palpable tenderness. In the supine position the abdomen is soft the bowel sounds are normal liver spleen kidneys nonenlarged no palpable masses or tenderness.  Orthopedic examination shows the legs to be pulling sensation muscle strength reflexes range of motion all normal peripheral pulses normal skin normal  Rectal examination normal stool  guaiac-negative      Assessment & Plan:  Right-sided back pain radiating to the right lower quadrant etiology unknown begin workup with labs and CT scan of the abdomen  History of coronary artery disease status post stent with recurrent back pain which was the presenting symptom for her coronary disease in the past. Would recommend she consider being recatheterized

## 2012-08-14 NOTE — Patient Instructions (Signed)
Labs today  We will get you set for a CT scan of your abdomen  Returned 2 days after CT scan to see me for followup  Keep a log of your symptoms

## 2012-08-17 ENCOUNTER — Encounter (HOSPITAL_COMMUNITY): Payer: Self-pay

## 2012-08-18 ENCOUNTER — Ambulatory Visit: Payer: 59 | Admitting: Family Medicine

## 2012-08-19 ENCOUNTER — Encounter (HOSPITAL_COMMUNITY)
Admission: RE | Admit: 2012-08-19 | Discharge: 2012-08-19 | Disposition: A | Discharge status: Home / Self Care | Payer: Self-pay | Source: Ambulatory Visit | Attending: Cardiology | Admitting: Cardiology

## 2012-08-20 ENCOUNTER — Other Ambulatory Visit: Payer: 59

## 2012-08-21 ENCOUNTER — Encounter (HOSPITAL_COMMUNITY): Payer: Self-pay

## 2012-08-24 ENCOUNTER — Encounter: Payer: Self-pay | Admitting: Nurse Practitioner

## 2012-08-24 ENCOUNTER — Ambulatory Visit (INDEPENDENT_AMBULATORY_CARE_PROVIDER_SITE_OTHER): Payer: 59 | Admitting: Nurse Practitioner

## 2012-08-24 ENCOUNTER — Ambulatory Visit (INDEPENDENT_AMBULATORY_CARE_PROVIDER_SITE_OTHER)
Admission: RE | Admit: 2012-08-24 | Discharge: 2012-08-24 | Disposition: A | Discharge status: Home / Self Care | Payer: 59 | Source: Ambulatory Visit | Attending: Family Medicine | Admitting: Family Medicine

## 2012-08-24 ENCOUNTER — Encounter (HOSPITAL_COMMUNITY)
Admission: RE | Admit: 2012-08-24 | Discharge: 2012-08-24 | Disposition: A | Discharge status: Home / Self Care | Payer: Self-pay | Source: Ambulatory Visit | Attending: Cardiology | Admitting: Cardiology

## 2012-08-24 VITALS — BP 118/78 | HR 84 | Ht 62.0 in | Wt 139.8 lb

## 2012-08-24 DIAGNOSIS — I2510 Atherosclerotic heart disease of native coronary artery without angina pectoris: Secondary | ICD-10-CM

## 2012-08-24 DIAGNOSIS — N950 Postmenopausal bleeding: Secondary | ICD-10-CM

## 2012-08-24 DIAGNOSIS — R079 Chest pain, unspecified: Secondary | ICD-10-CM

## 2012-08-24 DIAGNOSIS — M549 Dorsalgia, unspecified: Secondary | ICD-10-CM

## 2012-08-24 MED ORDER — IOHEXOL 300 MG/ML  SOLN
100.0000 mL | Freq: Once | INTRAMUSCULAR | Status: AC | PRN
  Administered 2012-08-24: 100 mL via INTRAVENOUS

## 2012-08-24 NOTE — Progress Notes (Signed)
Angelica [name] Date of Birth: 01/21/1956 Medical Record #[id number]  History of Present Illness: Angelica [name] is seen back today for a work in visit to discuss cardiac catheterization. She is seen for Angelica [name]. She has had a prior stress echo that was abnormal (with associated NSVT and ischemia) and she underwent cath in September of 2012. She had a DES to an ostial LAD lesion. There was some jailing of the LCX. She was recathed at [facility] in December of 2012 - stent was patent and otherwise nonobstructive. Negative event monitor in January of 2013. Mild carotid disease per doppler in December of 2012. Radial doppler in January of 2013 showed probable occlusion of the right radial with good collaterals. Normal Myoview in November of 2013.   Has had recurrent chest pain since January. Has seen Tereso [name] and Angelica [name] at separate visits. Has also seen Angelica [name]. Everyone has recommended repeat cath but she has not wished to pursue repeat cardiac cath. She continues to have occasional pain in the right scapular area and occasional substernal area - worse with stress. Some DOE. Works long hours.   She comes in today. She is here alone. She has good days and bad days. Still with "twinges" of back/scapular pain. Sometimes lightheaded and dizzy. Some nausea. Symptoms are not as severe as what she had with her original PCI and they do remain atypical. CT from earlier today with no acute abdominal findings. No NTG use. Her symptoms are just fleeting in nature. Has lots of stress with caring for a disabled husband and working "80 hours per week".    Current Outpatient Prescriptions on File Prior to Visit  Medication Sig Dispense Refill  . aspirin 81 MG tablet Take 81 mg by mouth daily.        . CRESTOR 20 MG tablet TAKE 1 TABLET EVERY NIGHT  90 tablet  2  . EFFIENT 10 MG TABS TAKE 1 TABLET DAILY  3 tablet  2  . metoprolol succinate (TOPROL XL) 25 MG 24 hr tablet Take 0.5 tablets (12.5 mg total)  by mouth daily.  15 tablet  11  . nitroGLYCERIN (NITROSTAT) 0.4 MG SL tablet Place 1 tablet (0.4 mg total) under the tongue every 5 (five) minutes as needed for chest pain.  25 tablet  12   No current facility-administered medications on file prior to visit.    Allergies  Allergen Reactions  . Prednisone Palpitations    Past Medical History  Diagnosis Date  . ALLERGIC RHINITIS 03/02/2007  . BREAST CYST, LEFT 11/23/2009  . 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 CAD (coronary artery disease)     s/p DES to LAD 9/12  . HLD (hyperlipidemia)     Past Surgical History  Procedure Laterality Date  . Breast surgery      BILAT IMPLANTS  . Breast biopsy    . Heart stent  01/2011    Angelica [name]  . Augmentation mammaplasty Bilateral 1990    History  Smoking status  . Never Smoker   Smokeless tobacco  . Never Used    History  Alcohol Use  . Yes    Comment: Occasional beer    Family History  Problem Relation Age of Onset  . Alcohol abuse Other   . Diabetes Other   . Hypertension Other   . Heart disease Other     Review of Systems: The review of systems is per the HPI.  All other systems were reviewed and are negative.  Physical Exam: BP 118/78  Pulse 84  Ht 5\' 2"  (1.575 m)  Wt 139 lb 12.8 oz (63.413 kg)  BMI 25.56 kg/m2  LMP 05/06/2010 Patient is very pleasant and in no acute distress. Not overly anxious to me. Skin is warm and dry. Color is normal.  HEENT is unremarkable. Normocephalic/atraumatic. PERRL. Sclera are nonicteric. Neck is supple. No masses. No JVD. Lungs are clear. Cardiac exam shows a regular rate and rhythm. Abdomen is soft. Extremities are without edema. Gait and ROM are intact. No gross neurologic deficits noted.  LABORATORY DATA:  Lab Results  Component Value Date   WBC 7.6 08/14/2012   HGB 13.7 08/14/2012   HCT 40.1 08/14/2012   PLT 296.0 08/14/2012   GLUCOSE 85 08/14/2012   CHOL 177 03/24/2012   TRIG 61.0 03/24/2012   HDL 92.60 03/24/2012    LDLDIRECT 101.2 11/15/2008   LDLCALC 72 03/24/2012   ALT 32 08/14/2012   AST 33 08/14/2012   NA 134* 08/14/2012   K 4.0 08/14/2012   CL 101 08/14/2012   CREATININE 0.7 08/14/2012   BUN 21 08/14/2012   CO2 23 08/14/2012   TSH 0.77 11/17/2009   INR 0.98 01/15/2011   HGBA1C 5.2 04/08/2011   US Pelvis Complete  08/05/2012  SEE PROGRESS NOTE FOR RESULT   Ct Abdomen Pelvis W Contrast  08/24/2012  *RADIOLOGY REPORT*  Clinical Data: Right lower quadrant pain.  The onset of vaginal bleeding, post menopausal  CT ABDOMEN AND PELVIS WITH CONTRAST  Technique:  Multidetector CT imaging of the abdomen and pelvis was performed following the standard protocol during bolus administration of intravenous contrast.  Contrast: OMNIPAQUE IOHEXOL 300 MG/ML  SOLN  Comparison: Ultrasound 08/05/2012  Findings: Lung bases are clear.  No pleural or pericardial fluid.  The liver has a normal appearance without focal lesions or biliary ductal dilatation.  No calcified gallstones.  The spleen is normal. The pancreas is normal.  The adrenal glands are normal.  The kidneys are normal.  No cyst, mass, stone or hydronephrosis.  The aorta and IVC are normal.  No retroperitoneal mass or adenopathy. No free intraperitoneal fluid or air.  No visible bowel pathology. The cecum is redundant.  The appendix is normal.  The uterus appears unremarkable.  No adnexal lesion.  The bladder appears normal.  No significant bony finding.  There are ordinary degenerative changes in the lumbar spine most notable at L4-5.  IMPRESSION: No cause of the described symptoms is identified.  Normal CT scan of the abdomen and pelvis with the exception of degenerative disease of the lumbar spine.   Original Report Authenticated By: Angelica [name], M.D.     Assessment / Plan: 1. Atypical chest pain with known CAD & atypical presentation - prior ostial LAD lesion - s/p DES - remains on her Effient. Right radial is occluded. Repeat cath has been recommended for the past  several months. Now willing to proceed but has to wait until after a presentation on April 29th. Have arrange LCP for Angelica [name] on May 1st. Check labs on the day of her procedure. She does have NTG on hand.   2. Occluded right radial - will plan for femoral approach.   3. HLD - on statin therapy.   The procedure, risks and benefits have been reviewed and she is willing to proceed.   Patient is agreeable to this plan and will call if any problems develop in the interim.   Rosalio Macadamia, RN, ANP-C [facility]  [address] [place], [place]  [address]

## 2012-08-24 NOTE — Patient Instructions (Signed)
We are going to arrange for a repeat cardiac catheterization with Dr. Theodoro Parma are scheduled for a cardiac catheterization on Thursday, May 1st with Dr. Excell Seltzer or associate.  Go to [facility] 2nd Floor Short Stay on Thursday at May 1st at 6:30am. We will do lab work on the morning of your procedure No food or drink after midnight on Wednesday    Coronary Angiography Coronary angiography is an X-ray procedure used to look at the arteries in the heart. In this procedure, a dye is injected through a long, hollow tube (catheter). The catheter is about the size of a piece of cooked spaghetti. The catheter injects a dye into an artery in your groin. X-rays are then taken to show if there is a blockage in the arteries of your heart. BEFORE THE PROCEDURE   Let your caregiver know if you have allergies to shellfish or contrast dye. Also let your caregiver know if you have kidney problems or failure.  Do not eat or drink starting from midnight up to the time of the procedure, or as directed.  You may drink enough water to take your medications the morning of the procedure if you were instructed to do so.  You should be at the hospital or outpatient facility where the procedure is to be done 60 minutes prior to the procedure or as directed. PROCEDURE  You may be given an IV medication to help you relax before the procedure.  You will be prepared for the procedure by washing and shaving the area where the catheter will be inserted. This is usually done in the groin but may be done in the fold of your arm by your elbow.  A medicine will be given to numb your groin where the catheter will be inserted.  A specially trained doctor will insert the catheter into an artery in your groin. The catheter is guided by using a special type of X-ray (fluoroscopy) to the blood vessel being examined.  A special dye is then injected into the catheter and X-rays are taken. The dye helps to show where any  narrowing or blockages are located in the heart arteries. AFTER THE PROCEDURE   After the procedure you will be kept in bed lying flat for several hours. You will be instructed to not bend or cross your legs.  The groin insertion site will be watched and checked frequently.  The pulse in your feet will be checked frequently.  Additional blood tests, X-rays and an EKG may be done.  You may stay in the hospital overnight for observation. SEEK IMMEDIATE MEDICAL CARE IF:   You develop chest pain, shortness of breath, feel faint, or pass out.  There is bleeding, swelling, or drainage from the catheter insertion site.  You develop pain, discoloration, coldness, or severe bruising in the leg or area where the catheter was inserted.  You have a fever. Document Released: 10/27/2002 Document Revised: 07/15/2011 Document Reviewed: 12/16/2007 [facility] Patient Information 2013 [place], [place]. , April 30th. You may take your medications with a sip of water on the day of your procedure.   Stay on all your current medicines - you may add OTC Prilosec daily  Call the [facility] office at [phone] if you have any questions, problems or concerns.

## 2012-08-26 ENCOUNTER — Encounter (HOSPITAL_COMMUNITY): Payer: Self-pay

## 2012-08-28 ENCOUNTER — Encounter (HOSPITAL_COMMUNITY)
Admission: RE | Admit: 2012-08-28 | Discharge: 2012-08-28 | Disposition: A | Discharge status: Home / Self Care | Payer: Self-pay | Source: Ambulatory Visit | Attending: Cardiology | Admitting: Cardiology

## 2012-08-31 ENCOUNTER — Encounter (HOSPITAL_COMMUNITY): Payer: Self-pay | Admitting: Pharmacist

## 2012-08-31 ENCOUNTER — Encounter (HOSPITAL_COMMUNITY): Payer: Self-pay

## 2012-09-02 ENCOUNTER — Ambulatory Visit: Payer: 59 | Admitting: Family Medicine

## 2012-09-02 ENCOUNTER — Encounter: Payer: Self-pay | Admitting: *Deleted

## 2012-09-02 ENCOUNTER — Encounter (HOSPITAL_COMMUNITY): Payer: Self-pay

## 2012-09-02 ENCOUNTER — Telehealth: Payer: Self-pay | Admitting: Cardiology

## 2012-09-02 NOTE — Telephone Encounter (Signed)
Left message for pt, cath has been cancelled. Pt to call to reschedule.

## 2012-09-02 NOTE — Telephone Encounter (Signed)
Pt is schedule for a cath on 09/03/12 with Dr. Excell Seltzer. PT STATES WOKE UP at 0400 AM with vomit and diarrhea. Pt wants to cancelled cath and rescheduled  for next week if possible. Cath lab notified ,spoke with [facility].

## 2012-09-02 NOTE — Telephone Encounter (Signed)
New problem   Pt is having a Cath tomorrow 09/03/12 and stated she is having nausea/diarrhea and want to reschedule til next week. Please call pt.

## 2012-09-03 ENCOUNTER — Ambulatory Visit (HOSPITAL_COMMUNITY): Admission: RE | Admit: 2012-09-03 | Payer: 59 | Source: Ambulatory Visit | Admitting: Cardiovascular Disease

## 2012-09-03 ENCOUNTER — Encounter (HOSPITAL_COMMUNITY): Admission: RE | Payer: Self-pay | Source: Ambulatory Visit

## 2012-09-03 SURGERY — LEFT HEART CATHETERIZATION WITH CORONARY ANGIOGRAM
Anesthesia: LOCAL

## 2012-09-04 ENCOUNTER — Telehealth: Payer: Self-pay | Admitting: Cardiology

## 2012-09-04 ENCOUNTER — Encounter (HOSPITAL_COMMUNITY): Payer: Self-pay

## 2012-09-04 DIAGNOSIS — I447 Left bundle-branch block, unspecified: Secondary | ICD-10-CM | POA: Insufficient documentation

## 2012-09-04 DIAGNOSIS — E785 Hyperlipidemia, unspecified: Secondary | ICD-10-CM | POA: Insufficient documentation

## 2012-09-04 DIAGNOSIS — I2510 Atherosclerotic heart disease of native coronary artery without angina pectoris: Secondary | ICD-10-CM | POA: Insufficient documentation

## 2012-09-04 DIAGNOSIS — R000 Tachycardia, unspecified: Secondary | ICD-10-CM | POA: Insufficient documentation

## 2012-09-04 DIAGNOSIS — I200 Unstable angina: Secondary | ICD-10-CM | POA: Insufficient documentation

## 2012-09-04 DIAGNOSIS — Z9861 Coronary angioplasty status: Secondary | ICD-10-CM | POA: Insufficient documentation

## 2012-09-04 DIAGNOSIS — Z5189 Encounter for other specified aftercare: Secondary | ICD-10-CM | POA: Insufficient documentation

## 2012-09-04 NOTE — Telephone Encounter (Signed)
°  New Prob    Pt is calling in to reschedule her cath that was cancelled from this week. Please call.

## 2012-09-04 NOTE — Telephone Encounter (Signed)
Left message for pt to call.

## 2012-09-07 ENCOUNTER — Encounter (HOSPITAL_COMMUNITY): Payer: Self-pay

## 2012-09-07 NOTE — Telephone Encounter (Signed)
F/u ° ° °Pt returning your call °

## 2012-09-07 NOTE — Telephone Encounter (Signed)
Spoke with pt, will reschedule cath for a day when dr cooper there and call the pt back.

## 2012-09-07 NOTE — Telephone Encounter (Signed)
Spoke with pt, cath rescheduled 09-10-12 @ 12 noon with cooper.

## 2012-09-08 ENCOUNTER — Ambulatory Visit (INDEPENDENT_AMBULATORY_CARE_PROVIDER_SITE_OTHER): Payer: 59 | Admitting: Family Medicine

## 2012-09-08 ENCOUNTER — Encounter: Payer: Self-pay | Admitting: Family Medicine

## 2012-09-08 VITALS — BP 110/80

## 2012-09-08 DIAGNOSIS — J309 Allergic rhinitis, unspecified: Secondary | ICD-10-CM

## 2012-09-08 DIAGNOSIS — G8929 Other chronic pain: Secondary | ICD-10-CM

## 2012-09-08 DIAGNOSIS — I2510 Atherosclerotic heart disease of native coronary artery without angina pectoris: Secondary | ICD-10-CM

## 2012-09-08 DIAGNOSIS — R1013 Epigastric pain: Secondary | ICD-10-CM

## 2012-09-08 NOTE — Progress Notes (Signed)
  Subjective:    Patient ID: Angelica [name], female    DOB: 1955/09/12, 57 y.o.   MRN: [id number]  HPI Angelica [name] is a 57 year old married female,,,,,,,,, husband is disabled,,,,,,,,,, she works 80 hours a week,,,,,, who comes in today for followup of an evaluation for abdominal pain  We saw her for evaluation of abdominal pain a couple weeks ago. Exam was normal. Laboratory data including a urinalysis liver functions CBC electrolytes all within normal limits. A CT scan of the abdomen showed no evidence of any pathology in the abdomen or the pelvis.  She's also had a D&C and pelvic ultrasound because of postmenopausal bleeding. She is to have a period about once a year in the spring.  She's due to have a followup angiogram with Dr. Excell Seltzer.   Review of Systems Review of systems otherwise negative    Objective:   Physical Exam Well-developed well nourished female no acute distress skin exam done because she has light skin and light eyes it was normal. Abdominal exam negative       Assessment & Plan:  Abdominal pain unknown etiology plan observe  Coronary artery disease followup angiogram

## 2012-09-08 NOTE — Patient Instructions (Signed)
We might want to consider a consult with Judithe Modest to discuss ways to deal with stress  Followup with me in one year for general physical exam sooner if any problems

## 2012-09-09 ENCOUNTER — Encounter (HOSPITAL_COMMUNITY): Payer: Self-pay

## 2012-09-09 ENCOUNTER — Encounter (HOSPITAL_COMMUNITY): Payer: Self-pay | Admitting: Pharmacy Technician

## 2012-09-10 ENCOUNTER — Encounter (HOSPITAL_COMMUNITY)
Admission: RE | Disposition: A | Discharge status: Home / Self Care | Payer: Self-pay | Source: Ambulatory Visit | Attending: Cardiovascular Disease

## 2012-09-10 ENCOUNTER — Ambulatory Visit (HOSPITAL_COMMUNITY)
Admission: RE | Admit: 2012-09-10 | Discharge: 2012-09-10 | Disposition: A | Discharge status: Home / Self Care | Payer: 59 | Source: Ambulatory Visit | Attending: Cardiovascular Disease | Admitting: Cardiovascular Disease

## 2012-09-10 DIAGNOSIS — N950 Postmenopausal bleeding: Secondary | ICD-10-CM | POA: Insufficient documentation

## 2012-09-10 DIAGNOSIS — Z9861 Coronary angioplasty status: Secondary | ICD-10-CM | POA: Insufficient documentation

## 2012-09-10 DIAGNOSIS — I6529 Occlusion and stenosis of unspecified carotid artery: Secondary | ICD-10-CM | POA: Insufficient documentation

## 2012-09-10 DIAGNOSIS — Z888 Allergy status to other drugs, medicaments and biological substances status: Secondary | ICD-10-CM | POA: Insufficient documentation

## 2012-09-10 DIAGNOSIS — E785 Hyperlipidemia, unspecified: Secondary | ICD-10-CM | POA: Insufficient documentation

## 2012-09-10 DIAGNOSIS — I2510 Atherosclerotic heart disease of native coronary artery without angina pectoris: Secondary | ICD-10-CM

## 2012-09-10 DIAGNOSIS — I70209 Unspecified atherosclerosis of native arteries of extremities, unspecified extremity: Secondary | ICD-10-CM | POA: Insufficient documentation

## 2012-09-10 DIAGNOSIS — J45909 Unspecified asthma, uncomplicated: Secondary | ICD-10-CM | POA: Insufficient documentation

## 2012-09-10 DIAGNOSIS — Z7982 Long term (current) use of aspirin: Secondary | ICD-10-CM | POA: Insufficient documentation

## 2012-09-10 DIAGNOSIS — R079 Chest pain, unspecified: Secondary | ICD-10-CM

## 2012-09-10 DIAGNOSIS — R0789 Other chest pain: Secondary | ICD-10-CM | POA: Insufficient documentation

## 2012-09-10 DIAGNOSIS — Z79899 Other long term (current) drug therapy: Secondary | ICD-10-CM | POA: Insufficient documentation

## 2012-09-10 DIAGNOSIS — I7092 Chronic total occlusion of artery of the extremities: Secondary | ICD-10-CM | POA: Insufficient documentation

## 2012-09-10 DIAGNOSIS — J309 Allergic rhinitis, unspecified: Secondary | ICD-10-CM | POA: Insufficient documentation

## 2012-09-10 DIAGNOSIS — R9439 Abnormal result of other cardiovascular function study: Secondary | ICD-10-CM | POA: Insufficient documentation

## 2012-09-10 DIAGNOSIS — I771 Stricture of artery: Secondary | ICD-10-CM | POA: Insufficient documentation

## 2012-09-10 HISTORY — PX: LEFT HEART CATHETERIZATION WITH CORONARY ANGIOGRAM: SHX5451

## 2012-09-10 LAB — BASIC METABOLIC PANEL
BUN: 15 mg/dL (ref 6–23)
CO2: 23 mEq/L (ref 19–32)
Calcium: 9.5 mg/dL (ref 8.4–10.5)
Chloride: 104 mEq/L (ref 96–112)
Creatinine, Ser: 0.59 mg/dL (ref 0.50–1.10)
GFR calc Af Amer: >90 mL/min (ref >90)
GFR calc non Af Amer: >90 mL/min (ref >90)
Glucose, Bld: 84 mg/dL (ref 70–99)
Potassium: 5.1 mEq/L (ref 3.5–5.1)
Sodium: 139 mEq/L (ref 135–145)

## 2012-09-10 LAB — CBC
HCT: 42.9 % (ref 36.0–46.0)
Hemoglobin: 14.9 g/dL (ref 12.0–15.0)
MCH: 33.5 pg (ref 26.0–34.0)
MCHC: 34.7 g/dL (ref 30.0–36.0)
MCV: 96.4 fL (ref 78.0–100.0)
Platelets: 269 10*3/uL (ref 150–400)
RBC: 4.45 MIL/uL (ref 3.87–5.11)
RDW: 12.8 % (ref 11.5–15.5)
WBC: 7.1 10*3/uL (ref 4.0–10.5)

## 2012-09-10 LAB — PROTIME-INR
INR: 0.89 (ref 0.00–1.49)
Prothrombin Time: 12 seconds (ref 11.6–15.2)

## 2012-09-10 SURGERY — LEFT HEART CATHETERIZATION WITH CORONARY ANGIOGRAM
Anesthesia: LOCAL

## 2012-09-10 MED ORDER — DIAZEPAM 5 MG PO TABS
10.0000 mg | ORAL_TABLET | ORAL | Status: AC
  Administered 2012-09-10: 10 mg via ORAL
  Filled 2012-09-10: qty 2

## 2012-09-10 MED ORDER — SODIUM CHLORIDE 0.9 % IJ SOLN: 3.0000 mL | Freq: Two times a day (BID) | INTRAMUSCULAR | Status: DC

## 2012-09-10 MED ORDER — MIDAZOLAM HCL 2 MG/2ML IJ SOLN
INTRAMUSCULAR | Status: AC
  Filled 2012-09-10: qty 2

## 2012-09-10 MED ORDER — HEPARIN (PORCINE) IN NACL 2-0.9 UNIT/ML-% IJ SOLN
INTRAMUSCULAR | Status: AC
  Filled 2012-09-10: qty 1000

## 2012-09-10 MED ORDER — FENTANYL CITRATE 0.05 MG/ML IJ SOLN
INTRAMUSCULAR | Status: AC
  Filled 2012-09-10: qty 2

## 2012-09-10 MED ORDER — SODIUM CHLORIDE 0.9 % IV SOLN: INTRAVENOUS | Status: DC

## 2012-09-10 MED ORDER — LIDOCAINE HCL (PF) 1 % IJ SOLN
INTRAMUSCULAR | Status: AC
  Filled 2012-09-10: qty 30

## 2012-09-10 MED ORDER — ACETAMINOPHEN 325 MG PO TABS: 650.0000 mg | ORAL_TABLET | ORAL | Status: DC | PRN

## 2012-09-10 MED ORDER — SODIUM CHLORIDE 0.9 % IV SOLN
250.0000 mL | INTRAVENOUS | Status: DC | PRN
  Administered 2012-09-10: 1000 mL via INTRAVENOUS

## 2012-09-10 MED ORDER — ASPIRIN 81 MG PO CHEW
324.0000 mg | CHEWABLE_TABLET | ORAL | Status: AC
  Administered 2012-09-10: 324 mg via ORAL
  Filled 2012-09-10: qty 4

## 2012-09-10 MED ORDER — ONDANSETRON HCL 4 MG/2ML IJ SOLN: 4.0000 mg | Freq: Four times a day (QID) | INTRAMUSCULAR | Status: DC | PRN

## 2012-09-10 MED ORDER — SODIUM CHLORIDE 0.9 % IJ SOLN: 3.0000 mL | INTRAMUSCULAR | Status: DC | PRN

## 2012-09-10 NOTE — Interval H&P Note (Signed)
History and Physical Interval Note:  09/10/2012 4:47 PM  Angelica [name]  has presented today for surgery, with the diagnosis of cp  The various methods of treatment have been discussed with the patient and family. After consideration of risks, benefits and other options for treatment, the patient has consented to  Procedure(s): LEFT HEART CATHETERIZATION WITH CORONARY ANGIOGRAM (N/A) as a surgical intervention .  The patient's history has been reviewed, patient examined, no change in status, stable for surgery.  I have reviewed the patient's chart and labs.  Questions were answered to the patient's satisfaction.     Tonny Bollman

## 2012-09-10 NOTE — H&P (View-Only) (Signed)
 Angelica [name] Date of Birth: 07/06/1955 Medical Record #[id number]  History of Present Illness: [name] is seen back today for a work in visit to discuss cardiac catheterization. She is seen for Dr. Crenshaw. She has had a prior stress echo that was abnormal (with associated NSVT and ischemia) and she underwent cath in September of 2012. She had a DES to an ostial LAD lesion. There was some jailing of the LCX. She was recathed at [facility] in December of 2012 - stent was patent and otherwise nonobstructive. Negative event monitor in January of 2013. Mild carotid disease per doppler in December of 2012. Radial doppler in January of 2013 showed probable occlusion of the right radial with good collaterals. Normal Myoview in November of 2013.   Has had recurrent chest pain since January. Has seen Scott Weaver and Dr. Crenshaw at separate visits. Has also seen Dr. Todd. Everyone has recommended repeat cath but she has not wished to pursue repeat cardiac cath. She continues to have occasional pain in the right scapular area and occasional substernal area - worse with stress. Some DOE. Works long hours.   She comes in today. She is here alone. She has good days and bad days. Still with "twinges" of back/scapular pain. Sometimes lightheaded and dizzy. Some nausea. Symptoms are not as severe as what she had with her original PCI and they do remain atypical. CT from earlier today with no acute abdominal findings. No NTG use. Her symptoms are just fleeting in nature. Has lots of stress with caring for a disabled husband and working "80 hours per week".    Current Outpatient Prescriptions on File Prior to Visit  Medication Sig Dispense Refill  . aspirin 81 MG tablet Take 81 mg by mouth daily.        . CRESTOR 20 MG tablet TAKE 1 TABLET EVERY NIGHT  90 tablet  2  . EFFIENT 10 MG TABS TAKE 1 TABLET DAILY  3 tablet  2  . metoprolol succinate (TOPROL XL) 25 MG 24 hr tablet Take 0.5 tablets (12.5 mg total)  by mouth daily.  15 tablet  11  . nitroGLYCERIN (NITROSTAT) 0.4 MG SL tablet Place 1 tablet (0.4 mg total) under the tongue every 5 (five) minutes as needed for chest pain.  25 tablet  12   No current facility-administered medications on file prior to visit.    Allergies  Allergen Reactions  . Prednisone Palpitations    Past Medical History  Diagnosis Date  . ALLERGIC RHINITIS 03/02/2007  . BREAST CYST, LEFT 11/23/2009  . 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 CAD (coronary artery disease)     s/p DES to LAD 9/12  . HLD (hyperlipidemia)     Past Surgical History  Procedure Laterality Date  . Breast surgery      BILAT IMPLANTS  . Breast biopsy    . Heart stent  01/2011    Dr. Crenshaw  . Augmentation mammaplasty Bilateral 1990    History  Smoking status  . Never Smoker   Smokeless tobacco  . Never Used    History  Alcohol Use  . Yes    Comment: Occasional beer    Family History  Problem Relation Age of Onset  . Alcohol abuse Other   . Diabetes Other   . Hypertension Other   . Heart disease Other     Review of Systems: The review of systems is per the HPI.  All other systems were reviewed and are negative.    Physical Exam: BP 118/78  Pulse 84  Ht 5' 2" (1.575 m)  Wt 139 lb 12.8 oz (63.413 kg)  BMI 25.56 kg/m2  LMP 05/06/2010 Patient is very pleasant and in no acute distress. Not overly anxious to me. Skin is warm and dry. Color is normal.  HEENT is unremarkable. Normocephalic/atraumatic. PERRL. Sclera are nonicteric. Neck is supple. No masses. No JVD. Lungs are clear. Cardiac exam shows a regular rate and rhythm. Abdomen is soft. Extremities are without edema. Gait and ROM are intact. No gross neurologic deficits noted.  LABORATORY DATA:  Lab Results  Component Value Date   WBC 7.6 08/14/2012   HGB 13.7 08/14/2012   HCT 40.1 08/14/2012   PLT 296.0 08/14/2012   GLUCOSE 85 08/14/2012   CHOL 177 03/24/2012   TRIG 61.0 03/24/2012   HDL 92.60 03/24/2012    LDLDIRECT 101.2 11/15/2008   LDLCALC 72 03/24/2012   ALT 32 08/14/2012   AST 33 08/14/2012   NA 134* 08/14/2012   K 4.0 08/14/2012   CL 101 08/14/2012   CREATININE 0.7 08/14/2012   BUN 21 08/14/2012   CO2 23 08/14/2012   TSH 0.77 11/17/2009   INR 0.98 01/15/2011   HGBA1C 5.2 04/08/2011   Us Pelvis Complete  08/05/2012  SEE PROGRESS NOTE FOR RESULT   Ct Abdomen Pelvis W Contrast  08/24/2012  *RADIOLOGY REPORT*  Clinical Data: Right lower quadrant pain.  The onset of vaginal bleeding, post menopausal  CT ABDOMEN AND PELVIS WITH CONTRAST  Technique:  Multidetector CT imaging of the abdomen and pelvis was performed following the standard protocol during bolus administration of intravenous contrast.  Contrast: 100mL OMNIPAQUE IOHEXOL 300 MG/ML  SOLN  Comparison: Ultrasound 08/05/2012  Findings: Lung bases are clear.  No pleural or pericardial fluid.  The liver has a normal appearance without focal lesions or biliary ductal dilatation.  No calcified gallstones.  The spleen is normal. The pancreas is normal.  The adrenal glands are normal.  The kidneys are normal.  No cyst, mass, stone or hydronephrosis.  The aorta and IVC are normal.  No retroperitoneal mass or adenopathy. No free intraperitoneal fluid or air.  No visible bowel pathology. The cecum is redundant.  The appendix is normal.  The uterus appears unremarkable.  No adnexal lesion.  The bladder appears normal.  No significant bony finding.  There are ordinary degenerative changes in the lumbar spine most notable at L4-5.  IMPRESSION: No cause of the described symptoms is identified.  Normal CT scan of the abdomen and pelvis with the exception of degenerative disease of the lumbar spine.   Original Report Authenticated By: Mark Shogry, M.D.     Assessment / Plan: 1. Atypical chest pain with known CAD & atypical presentation - prior ostial LAD lesion - s/p DES - remains on her Effient. Right radial is occluded. Repeat cath has been recommended for the past  several months. Now willing to proceed but has to wait until after a presentation on April 29th. Have arrange LCP for Dr. Cooper on May 1st. Check labs on the day of her procedure. She does have NTG on hand.   2. Occluded right radial - will plan for femoral approach.   3. HLD - on statin therapy.   The procedure, risks and benefits have been reviewed and she is willing to proceed.   Patient is agreeable to this plan and will call if any problems develop in the interim.   Maybree Riling C. Cyndel Griffey, RN, ANP-C [facility]   [address] [place], [place]  [address]  

## 2012-09-10 NOTE — CV Procedure (Signed)
   Cardiac Catheterization Procedure Note  Name: Angelica [name] MRN: [id number] DOB: 1956/02/06  Procedure: Left Heart Cath, Selective Coronary Angiography, LV angiography  Indication: Chest and back pain, known CAD, atypical angina but patient with history of atypical angina predating PCI procedure in the past  Procedural details: The right groin was prepped, draped, and anesthetized with 1% lidocaine. Using modified Seldinger technique, a 5 French sheath was introduced into the right femoral artery. Standard Judkins catheters were used for coronary angiography and left ventriculography. Catheter exchanges were performed over a guidewire. There were no immediate procedural complications. The patient was transferred to the post catheterization recovery area for further monitoring.  Procedural Findings: Hemodynamics:  AO 129/76 LV 134/14   Coronary angiography: Coronary dominance: right  Left mainstem: The left main is patent with no obstructive disease.  Left anterior descending (LAD): The LAD is patent to the left ventricular apex. The proximal stent is widely patent with no significant in-stent restenosis. There is a large first diagonal branch without significant stenosis. Just beyond the diagonal branch in the mid LAD there is 50% hypodense stenosis. This has progressed since the previous study. There is a significant step down in caliber of the vessel after the first diagonal. When compared to the distal reference vessel, the lesion is no greater than 50%. The remaining portions of the mid and distal LAD have no significant stenosis. The distal vessel is tortuous.  Left circumflex (LCx): The left circumflex is small in caliber. The vessel has mild ostial narrowing of 30-40%. There is a single OM without significant stenosis.  Right coronary artery (RCA): This is a large caliber, dominant vessel. The PDA and PLA branches are patent. There are no significant stenoses throughout the  RCA distribution. The acute marginal branch is patent.  Left ventriculography: Left ventricular systolic function is low normal, LVEF is estimated at 55%, there is no significant mitral regurgitation   Final Conclusions:   1. Continued patency of the proximal LAD stent with moderate mid LAD stenosis 2. Wide patency of the left circumflex and right coronary arteries 3. Low normal left ventricular systolic function  Recommendations: Continued medical therapy. I do not think the patient's mid LAD stenosis is obstructive, especially considering her fairly atypical symptoms.  Tonny Bollman 09/10/2012, 5:20 PM

## 2012-09-11 ENCOUNTER — Telehealth: Payer: Self-pay | Admitting: Cardiovascular Disease

## 2012-09-11 ENCOUNTER — Encounter (HOSPITAL_COMMUNITY): Payer: Self-pay

## 2012-09-11 NOTE — Telephone Encounter (Signed)
This is a patient of Crenshaw's

## 2012-09-11 NOTE — Telephone Encounter (Signed)
I spoke with the pt and made her aware that she is okay to remove the dressing from cath site.  I instructed the pt that she can go ahead and take a shower but should avoid tub bath or sitting in water for one week. The pt can also place a band aid at cath site if needed.

## 2012-09-11 NOTE — Telephone Encounter (Signed)
New problem   Pt want to know if she can take a shower on Saturday and when should she remove the bandage site. Dr Excell Seltzer done her cath. Please call pt

## 2012-09-14 ENCOUNTER — Ambulatory Visit (INDEPENDENT_AMBULATORY_CARE_PROVIDER_SITE_OTHER): Payer: 59 | Admitting: Cardiology

## 2012-09-14 ENCOUNTER — Encounter: Payer: Self-pay | Admitting: Cardiology

## 2012-09-14 ENCOUNTER — Encounter (HOSPITAL_COMMUNITY): Payer: Self-pay

## 2012-09-14 VITALS — BP 126/85 | HR 66 | Ht 62.0 in | Wt 138.0 lb

## 2012-09-14 DIAGNOSIS — R079 Chest pain, unspecified: Secondary | ICD-10-CM

## 2012-09-14 DIAGNOSIS — I2510 Atherosclerotic heart disease of native coronary artery without angina pectoris: Secondary | ICD-10-CM

## 2012-09-14 DIAGNOSIS — E785 Hyperlipidemia, unspecified: Secondary | ICD-10-CM

## 2012-09-14 MED ORDER — NITROGLYCERIN 0.4 MG SL SUBL: 0.4000 mg | SUBLINGUAL_TABLET | SUBLINGUAL | Status: DC | PRN

## 2012-09-14 NOTE — Assessment & Plan Note (Signed)
Patient continues to have occasional chest pain. Her symptoms are atypical. Recent catheterization shows nonobstructive coronary disease. Continue medical therapy.

## 2012-09-14 NOTE — Assessment & Plan Note (Signed)
Continue aspirin, effient and statin; will consider DCing effient when she returns in six months.

## 2012-09-14 NOTE — Patient Instructions (Addendum)
Your physician wants you to follow-up in: 6 MONTHS WITH DR CRENSHAW You will receive a reminder letter in the mail two months in advance. If you don't receive a letter, please call our office to schedule the follow-up appointment.  

## 2012-09-14 NOTE — Assessment & Plan Note (Signed)
Continue statin. 

## 2012-09-14 NOTE — Progress Notes (Signed)
HPI: Pleasant female for followup of coronary disease. Previously seen for chest pain. Stress echo was markedly abnormal with nonsustained ventricular tachycardia and ischemia in the anteroseptal wall and apex. Patient underwent cardiac catheterization in September of 2012 which revealed a 95% ostial LAD lesion. The patient had PCI with a drug-eluting stent. There was some jailing of the circumflex. There was recommendation for aspirin indefinitely and effient for at least one year and possibly indefinitely. Repeat cath at [facility] in Dec 2012 because of palpitations and chest pain revealed patency of LAD stent and she had no other significant CAD. Had event monitor placed in Jan 2013 that showed sinus rhythm. Carotid Dopplers December 2012 showed 0-39% bilateral stenosis. 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 Myoview in November of 2013 showed an ejection fraction of 73%. There was no ischemia. Patient has continued to have chest pain. She had repeat catheterization in May of 2014 which revealed normal left main, patent LAD stent, 50% mid LAD. There was a 30-40% ostial circumflex. Ejection fraction was 55%. Since that time, she continues to have occasional twinges of pain in her back and chest. Some dyspnea on exertion. No syncope.   Current Outpatient Prescriptions  Medication Sig Dispense Refill  . aspirin EC 81 MG tablet Take 81 mg by mouth every evening.      Marland Kitchen ibuprofen (ADVIL,MOTRIN) 200 MG tablet Take 200-400 mg by mouth every 6 (six) hours as needed for pain or headache.      . nitroGLYCERIN (NITROSTAT) 0.4 MG SL tablet Place 1 tablet (0.4 mg total) under the tongue every 5 (five) minutes as needed for chest pain.  25 tablet  12  . prasugrel (EFFIENT) 10 MG TABS Take 10 mg by mouth every morning.      . rosuvastatin (CRESTOR) 20 MG tablet Take 20 mg by mouth every evening.       No current facility-administered medications for this visit.       Past Medical History  Diagnosis Date  . ALLERGIC RHINITIS 03/02/2007  . BREAST CYST, LEFT 11/23/2009  . 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 CAD (coronary artery disease)     s/p DES to LAD 9/12  . HLD (hyperlipidemia)     Past Surgical History  Procedure Laterality Date  . Breast surgery      BILAT IMPLANTS  . Breast biopsy    . Heart stent  01/2011    Dr. Jens Som  . Augmentation mammaplasty Bilateral 1990    History   Social History  . Marital Status: Married    Spouse Name: N/A    Number of Children: N/A  . 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 Smoking status: Never Smoker   . Smokeless tobacco: Never Used  . Alcohol Use: Yes     Comment: Occasional beer  . Drug Use: No  . Sexually Active: Not Currently   Other Topics Concern  . Not on file   Social History Narrative   Occupation:    Never Smoked   Alcohol use- no   Married   Drug use- no   Regular Exercise- yes     ROS: no fevers or chills, productive cough, hemoptysis, dysphasia, odynophagia, melena, hematochezia, dysuria, hematuria, rash, seizure activity, orthopnea, PND, pedal edema, claudication. Remaining systems are negative.  Physical Exam: Well-developed well-nourished in no acute distress.  Skin is warm and dry.  HEENT is normal.  Neck is supple.  Chest is clear to auscultation with normal expansion.  Cardiovascular exam is regular rate and rhythm.  Abdominal exam nontender or distended. No masses palpated. Extremities show no edema. neuro grossly intact

## 2012-09-16 ENCOUNTER — Encounter (HOSPITAL_COMMUNITY): Payer: Self-pay

## 2012-09-18 ENCOUNTER — Encounter (HOSPITAL_COMMUNITY)
Admission: RE | Admit: 2012-09-18 | Discharge: 2012-09-18 | Disposition: A | Discharge status: Home / Self Care | Payer: Self-pay | Source: Ambulatory Visit | Attending: Cardiology | Admitting: Cardiology

## 2012-09-21 ENCOUNTER — Encounter (HOSPITAL_COMMUNITY): Payer: Self-pay

## 2012-09-23 ENCOUNTER — Encounter (HOSPITAL_COMMUNITY): Payer: Self-pay

## 2012-09-25 ENCOUNTER — Encounter (HOSPITAL_COMMUNITY)
Admission: RE | Admit: 2012-09-25 | Discharge: 2012-09-25 | Disposition: A | Discharge status: Home / Self Care | Payer: Self-pay | Source: Ambulatory Visit | Attending: Cardiology | Admitting: Cardiology

## 2012-09-30 ENCOUNTER — Encounter (HOSPITAL_COMMUNITY): Payer: Self-pay

## 2012-10-02 ENCOUNTER — Encounter (HOSPITAL_COMMUNITY)
Admission: RE | Admit: 2012-10-02 | Discharge: 2012-10-02 | Disposition: A | Discharge status: Home / Self Care | Payer: Self-pay | Source: Ambulatory Visit | Attending: Cardiology | Admitting: Cardiology

## 2012-10-05 ENCOUNTER — Encounter (HOSPITAL_COMMUNITY): Payer: Self-pay

## 2012-10-05 DIAGNOSIS — R000 Tachycardia, unspecified: Secondary | ICD-10-CM | POA: Insufficient documentation

## 2012-10-05 DIAGNOSIS — I2510 Atherosclerotic heart disease of native coronary artery without angina pectoris: Secondary | ICD-10-CM | POA: Insufficient documentation

## 2012-10-05 DIAGNOSIS — I447 Left bundle-branch block, unspecified: Secondary | ICD-10-CM | POA: Insufficient documentation

## 2012-10-05 DIAGNOSIS — I200 Unstable angina: Secondary | ICD-10-CM | POA: Insufficient documentation

## 2012-10-05 DIAGNOSIS — E785 Hyperlipidemia, unspecified: Secondary | ICD-10-CM | POA: Insufficient documentation

## 2012-10-05 DIAGNOSIS — Z5189 Encounter for other specified aftercare: Secondary | ICD-10-CM | POA: Insufficient documentation

## 2012-10-05 DIAGNOSIS — Z9861 Coronary angioplasty status: Secondary | ICD-10-CM | POA: Insufficient documentation

## 2012-10-07 ENCOUNTER — Encounter (HOSPITAL_COMMUNITY): Payer: Self-pay

## 2012-10-09 ENCOUNTER — Encounter (HOSPITAL_COMMUNITY)
Admission: RE | Admit: 2012-10-09 | Discharge: 2012-10-09 | Disposition: A | Discharge status: Home / Self Care | Payer: Self-pay | Source: Ambulatory Visit | Attending: Cardiology | Admitting: Cardiology

## 2012-10-12 ENCOUNTER — Encounter (HOSPITAL_COMMUNITY): Payer: Self-pay

## 2012-10-14 ENCOUNTER — Encounter (HOSPITAL_COMMUNITY): Payer: Self-pay

## 2012-10-16 ENCOUNTER — Encounter (HOSPITAL_COMMUNITY)
Admission: RE | Admit: 2012-10-16 | Discharge: 2012-10-16 | Disposition: A | Discharge status: Home / Self Care | Payer: Self-pay | Source: Ambulatory Visit | Attending: Cardiology | Admitting: Cardiology

## 2012-10-19 ENCOUNTER — Encounter (HOSPITAL_COMMUNITY): Payer: Self-pay

## 2012-10-21 ENCOUNTER — Encounter (HOSPITAL_COMMUNITY): Payer: Self-pay

## 2012-10-23 ENCOUNTER — Encounter (HOSPITAL_COMMUNITY): Payer: Self-pay

## 2012-10-26 ENCOUNTER — Encounter (HOSPITAL_COMMUNITY): Payer: Self-pay

## 2012-10-28 ENCOUNTER — Encounter (HOSPITAL_COMMUNITY): Payer: Self-pay

## 2012-10-30 ENCOUNTER — Encounter (HOSPITAL_COMMUNITY)
Admission: RE | Admit: 2012-10-30 | Discharge: 2012-10-30 | Disposition: A | Discharge status: Home / Self Care | Payer: Self-pay | Source: Ambulatory Visit | Attending: Cardiology | Admitting: Cardiology

## 2012-11-02 ENCOUNTER — Encounter (HOSPITAL_COMMUNITY): Payer: Self-pay

## 2012-11-04 ENCOUNTER — Encounter (HOSPITAL_COMMUNITY)
Admission: RE | Admit: 2012-11-04 | Discharge: 2012-11-04 | Disposition: A | Discharge status: Home / Self Care | Payer: Self-pay | Source: Ambulatory Visit | Attending: Cardiology | Admitting: Cardiology

## 2012-11-04 DIAGNOSIS — Z5189 Encounter for other specified aftercare: Secondary | ICD-10-CM | POA: Insufficient documentation

## 2012-11-04 DIAGNOSIS — I2510 Atherosclerotic heart disease of native coronary artery without angina pectoris: Secondary | ICD-10-CM | POA: Insufficient documentation

## 2012-11-04 DIAGNOSIS — E785 Hyperlipidemia, unspecified: Secondary | ICD-10-CM | POA: Insufficient documentation

## 2012-11-04 DIAGNOSIS — Z9861 Coronary angioplasty status: Secondary | ICD-10-CM | POA: Insufficient documentation

## 2012-11-04 DIAGNOSIS — I447 Left bundle-branch block, unspecified: Secondary | ICD-10-CM | POA: Insufficient documentation

## 2012-11-04 DIAGNOSIS — I200 Unstable angina: Secondary | ICD-10-CM | POA: Insufficient documentation

## 2012-11-04 DIAGNOSIS — R000 Tachycardia, unspecified: Secondary | ICD-10-CM | POA: Insufficient documentation

## 2012-11-09 ENCOUNTER — Encounter (HOSPITAL_COMMUNITY): Payer: Self-pay

## 2012-11-11 ENCOUNTER — Encounter (HOSPITAL_COMMUNITY): Payer: Self-pay

## 2012-11-13 ENCOUNTER — Encounter (HOSPITAL_COMMUNITY): Payer: Self-pay

## 2012-11-16 ENCOUNTER — Encounter (HOSPITAL_COMMUNITY): Payer: Self-pay

## 2012-11-18 ENCOUNTER — Encounter (HOSPITAL_COMMUNITY): Payer: Self-pay

## 2012-11-20 ENCOUNTER — Encounter (HOSPITAL_COMMUNITY): Payer: Self-pay

## 2012-11-23 ENCOUNTER — Encounter (HOSPITAL_COMMUNITY): Payer: Self-pay

## 2012-11-25 ENCOUNTER — Encounter (HOSPITAL_COMMUNITY): Payer: Self-pay

## 2012-11-27 ENCOUNTER — Encounter (HOSPITAL_COMMUNITY): Payer: Self-pay

## 2012-11-30 ENCOUNTER — Encounter (HOSPITAL_COMMUNITY): Payer: Self-pay

## 2012-11-30 ENCOUNTER — Telehealth (HOSPITAL_COMMUNITY): Payer: Self-pay | Admitting: *Deleted

## 2012-12-02 ENCOUNTER — Encounter (HOSPITAL_COMMUNITY): Payer: Self-pay

## 2012-12-04 ENCOUNTER — Encounter (HOSPITAL_COMMUNITY): Payer: Managed Care, Other (non HMO)

## 2012-12-04 DIAGNOSIS — Z9861 Coronary angioplasty status: Secondary | ICD-10-CM | POA: Insufficient documentation

## 2012-12-04 DIAGNOSIS — E785 Hyperlipidemia, unspecified: Secondary | ICD-10-CM | POA: Insufficient documentation

## 2012-12-04 DIAGNOSIS — Z5189 Encounter for other specified aftercare: Secondary | ICD-10-CM | POA: Insufficient documentation

## 2012-12-04 DIAGNOSIS — I200 Unstable angina: Secondary | ICD-10-CM | POA: Insufficient documentation

## 2012-12-04 DIAGNOSIS — I447 Left bundle-branch block, unspecified: Secondary | ICD-10-CM | POA: Insufficient documentation

## 2012-12-04 DIAGNOSIS — R000 Tachycardia, unspecified: Secondary | ICD-10-CM | POA: Insufficient documentation

## 2012-12-04 DIAGNOSIS — I2510 Atherosclerotic heart disease of native coronary artery without angina pectoris: Secondary | ICD-10-CM | POA: Insufficient documentation

## 2012-12-07 ENCOUNTER — Encounter (HOSPITAL_COMMUNITY): Payer: Managed Care, Other (non HMO)

## 2012-12-09 ENCOUNTER — Encounter (HOSPITAL_COMMUNITY): Payer: Managed Care, Other (non HMO)

## 2012-12-11 ENCOUNTER — Encounter (HOSPITAL_COMMUNITY): Payer: Managed Care, Other (non HMO)

## 2012-12-14 ENCOUNTER — Encounter (HOSPITAL_COMMUNITY): Payer: Managed Care, Other (non HMO)

## 2012-12-16 ENCOUNTER — Encounter (HOSPITAL_COMMUNITY): Payer: Managed Care, Other (non HMO)

## 2012-12-18 ENCOUNTER — Encounter (HOSPITAL_COMMUNITY): Payer: Managed Care, Other (non HMO)

## 2012-12-21 ENCOUNTER — Encounter (HOSPITAL_COMMUNITY)
Admission: RE | Admit: 2012-12-21 | Discharge: 2012-12-21 | Disposition: A | Discharge status: Home / Self Care | Payer: Managed Care, Other (non HMO) | Source: Ambulatory Visit | Attending: Cardiology | Admitting: Cardiology

## 2012-12-23 ENCOUNTER — Encounter (HOSPITAL_COMMUNITY): Payer: Managed Care, Other (non HMO)

## 2012-12-25 ENCOUNTER — Encounter (HOSPITAL_COMMUNITY): Payer: Managed Care, Other (non HMO)

## 2012-12-28 ENCOUNTER — Encounter (HOSPITAL_COMMUNITY): Payer: Managed Care, Other (non HMO)

## 2012-12-30 ENCOUNTER — Encounter (HOSPITAL_COMMUNITY): Payer: Managed Care, Other (non HMO)

## 2013-01-01 ENCOUNTER — Encounter (HOSPITAL_COMMUNITY): Payer: Managed Care, Other (non HMO)

## 2013-01-06 ENCOUNTER — Encounter (HOSPITAL_COMMUNITY)
Admission: RE | Admit: 2013-01-06 | Discharge: 2013-01-06 | Disposition: A | Discharge status: Home / Self Care | Payer: 59 | Source: Ambulatory Visit | Attending: Cardiology | Admitting: Cardiology

## 2013-01-06 DIAGNOSIS — E785 Hyperlipidemia, unspecified: Secondary | ICD-10-CM | POA: Insufficient documentation

## 2013-01-06 DIAGNOSIS — I2510 Atherosclerotic heart disease of native coronary artery without angina pectoris: Secondary | ICD-10-CM | POA: Insufficient documentation

## 2013-01-06 DIAGNOSIS — R000 Tachycardia, unspecified: Secondary | ICD-10-CM | POA: Insufficient documentation

## 2013-01-06 DIAGNOSIS — Z9861 Coronary angioplasty status: Secondary | ICD-10-CM | POA: Insufficient documentation

## 2013-01-06 DIAGNOSIS — I200 Unstable angina: Secondary | ICD-10-CM | POA: Insufficient documentation

## 2013-01-06 DIAGNOSIS — I447 Left bundle-branch block, unspecified: Secondary | ICD-10-CM | POA: Insufficient documentation

## 2013-01-06 DIAGNOSIS — Z5189 Encounter for other specified aftercare: Secondary | ICD-10-CM | POA: Insufficient documentation

## 2013-01-08 ENCOUNTER — Encounter (HOSPITAL_COMMUNITY): Payer: 59

## 2013-01-11 ENCOUNTER — Other Ambulatory Visit: Payer: Self-pay | Admitting: Cardiology

## 2013-01-11 ENCOUNTER — Encounter (HOSPITAL_COMMUNITY): Payer: 59

## 2013-01-13 ENCOUNTER — Encounter (HOSPITAL_COMMUNITY)
Admission: RE | Admit: 2013-01-13 | Discharge: 2013-01-13 | Disposition: A | Discharge status: Home / Self Care | Payer: 59 | Source: Ambulatory Visit | Attending: Cardiology | Admitting: Cardiology

## 2013-01-15 ENCOUNTER — Encounter (HOSPITAL_COMMUNITY)
Admission: RE | Admit: 2013-01-15 | Discharge: 2013-01-15 | Disposition: A | Discharge status: Home / Self Care | Payer: 59 | Source: Ambulatory Visit | Attending: Cardiology | Admitting: Cardiology

## 2013-01-18 ENCOUNTER — Encounter (HOSPITAL_COMMUNITY): Payer: 59

## 2013-01-20 ENCOUNTER — Encounter (HOSPITAL_COMMUNITY): Payer: 59

## 2013-01-22 ENCOUNTER — Encounter (HOSPITAL_COMMUNITY): Payer: 59

## 2013-01-25 ENCOUNTER — Encounter (HOSPITAL_COMMUNITY): Payer: 59

## 2013-01-27 ENCOUNTER — Encounter (HOSPITAL_COMMUNITY): Payer: 59

## 2013-01-29 ENCOUNTER — Encounter (HOSPITAL_COMMUNITY): Payer: 59

## 2013-02-01 ENCOUNTER — Encounter (HOSPITAL_COMMUNITY): Payer: 59

## 2013-02-03 ENCOUNTER — Encounter (HOSPITAL_COMMUNITY): Payer: 59 | Attending: Cardiology

## 2013-02-03 DIAGNOSIS — I447 Left bundle-branch block, unspecified: Secondary | ICD-10-CM | POA: Insufficient documentation

## 2013-02-03 DIAGNOSIS — Z5189 Encounter for other specified aftercare: Secondary | ICD-10-CM | POA: Insufficient documentation

## 2013-02-03 DIAGNOSIS — I200 Unstable angina: Secondary | ICD-10-CM | POA: Insufficient documentation

## 2013-02-03 DIAGNOSIS — R000 Tachycardia, unspecified: Secondary | ICD-10-CM | POA: Insufficient documentation

## 2013-02-03 DIAGNOSIS — Z9861 Coronary angioplasty status: Secondary | ICD-10-CM | POA: Insufficient documentation

## 2013-02-03 DIAGNOSIS — I2510 Atherosclerotic heart disease of native coronary artery without angina pectoris: Secondary | ICD-10-CM | POA: Insufficient documentation

## 2013-02-03 DIAGNOSIS — E785 Hyperlipidemia, unspecified: Secondary | ICD-10-CM | POA: Insufficient documentation

## 2013-02-05 ENCOUNTER — Encounter (HOSPITAL_COMMUNITY): Payer: 59

## 2013-02-08 ENCOUNTER — Encounter (HOSPITAL_COMMUNITY): Payer: 59

## 2013-02-10 ENCOUNTER — Encounter (HOSPITAL_COMMUNITY): Payer: 59

## 2013-02-12 ENCOUNTER — Encounter (HOSPITAL_COMMUNITY): Payer: 59

## 2013-02-15 ENCOUNTER — Encounter (HOSPITAL_COMMUNITY): Payer: 59

## 2013-02-17 ENCOUNTER — Encounter (HOSPITAL_COMMUNITY): Payer: 59

## 2013-02-19 ENCOUNTER — Encounter (HOSPITAL_COMMUNITY): Payer: 59

## 2013-02-22 ENCOUNTER — Encounter (HOSPITAL_COMMUNITY): Payer: 59

## 2013-02-24 ENCOUNTER — Encounter (HOSPITAL_COMMUNITY): Payer: 59

## 2013-02-26 ENCOUNTER — Encounter (HOSPITAL_COMMUNITY): Payer: 59

## 2013-03-01 ENCOUNTER — Encounter (HOSPITAL_COMMUNITY): Payer: 59

## 2013-03-03 ENCOUNTER — Encounter (HOSPITAL_COMMUNITY): Payer: 59

## 2013-03-05 ENCOUNTER — Encounter (HOSPITAL_COMMUNITY): Payer: 59

## 2013-03-08 ENCOUNTER — Encounter (HOSPITAL_COMMUNITY): Payer: 59 | Attending: Cardiology

## 2013-03-08 DIAGNOSIS — Z5189 Encounter for other specified aftercare: Secondary | ICD-10-CM | POA: Insufficient documentation

## 2013-03-08 DIAGNOSIS — R000 Tachycardia, unspecified: Secondary | ICD-10-CM | POA: Insufficient documentation

## 2013-03-08 DIAGNOSIS — I447 Left bundle-branch block, unspecified: Secondary | ICD-10-CM | POA: Insufficient documentation

## 2013-03-08 DIAGNOSIS — E785 Hyperlipidemia, unspecified: Secondary | ICD-10-CM | POA: Insufficient documentation

## 2013-03-08 DIAGNOSIS — Z9861 Coronary angioplasty status: Secondary | ICD-10-CM | POA: Insufficient documentation

## 2013-03-08 DIAGNOSIS — I2510 Atherosclerotic heart disease of native coronary artery without angina pectoris: Secondary | ICD-10-CM | POA: Insufficient documentation

## 2013-03-08 DIAGNOSIS — I200 Unstable angina: Secondary | ICD-10-CM | POA: Insufficient documentation

## 2013-03-10 ENCOUNTER — Encounter (HOSPITAL_COMMUNITY): Payer: 59

## 2013-03-12 ENCOUNTER — Encounter (HOSPITAL_COMMUNITY): Payer: 59

## 2013-03-15 ENCOUNTER — Encounter (HOSPITAL_COMMUNITY): Payer: 59

## 2013-03-17 ENCOUNTER — Encounter (HOSPITAL_COMMUNITY): Payer: 59

## 2013-03-18 ENCOUNTER — Ambulatory Visit (INDEPENDENT_AMBULATORY_CARE_PROVIDER_SITE_OTHER): Payer: 59 | Admitting: Cardiology

## 2013-03-18 ENCOUNTER — Encounter: Payer: Self-pay | Admitting: Cardiology

## 2013-03-18 ENCOUNTER — Encounter: Payer: Self-pay | Admitting: *Deleted

## 2013-03-18 VITALS — BP 122/80 | HR 62 | Ht 63.0 in | Wt 137.0 lb

## 2013-03-18 DIAGNOSIS — E785 Hyperlipidemia, unspecified: Secondary | ICD-10-CM

## 2013-03-18 DIAGNOSIS — I2510 Atherosclerotic heart disease of native coronary artery without angina pectoris: Secondary | ICD-10-CM

## 2013-03-18 LAB — HEPATIC FUNCTION PANEL
ALT: 39 U/L — ABNORMAL HIGH (ref 0–35)
AST: 27 U/L (ref 0–37)
Albumin: 4.3 g/dL (ref 3.5–5.2)
Total Bilirubin: 1.2 mg/dL (ref 0.3–1.2)

## 2013-03-18 LAB — BASIC METABOLIC PANEL
Calcium: 9.4 mg/dL (ref 8.4–10.5)
GFR: 103.57 mL/min (ref >60.00)
Glucose, Bld: 88 mg/dL (ref 70–99)
Potassium: 3.9 mEq/L (ref 3.5–5.1)
Sodium: 138 mEq/L (ref 135–145)

## 2013-03-18 LAB — LIPID PANEL
HDL: 88.8 mg/dL (ref >39.00)
Total CHOL/HDL Ratio: 2
Triglycerides: 32 mg/dL (ref 0.0–149.0)
VLDL: 6.4 mg/dL (ref 0.0–40.0)

## 2013-03-18 NOTE — Patient Instructions (Signed)
Your physician wants you to follow-up in: ONE YEAR WITH DR Shelda Pal will receive a reminder letter in the mail two months in advance. If you don't receive a letter, please call our office to schedule the follow-up appointment.   STOP EFFIENT  Your physician recommends that you HAVE LAB WORK TODAY

## 2013-03-18 NOTE — Assessment & Plan Note (Signed)
Continue statin. Check lipids and liver. 

## 2013-03-18 NOTE — Progress Notes (Signed)
HPI: Pleasant female for followup of coronary disease. Previously seen for chest pain. Stress echo was markedly abnormal with nonsustained ventricular tachycardia and ischemia in the anteroseptal wall and apex. Patient underwent cardiac catheterization in September of 2012 which revealed a 95% ostial LAD lesion. The patient had PCI with a drug-eluting stent. There was some jailing of the circumflex. There was recommendation for aspirin indefinitely and effient for at least one year and possibly indefinitely. Repeat cath at [facility] in Dec 2012 because of palpitations and chest pain revealed patency of LAD stent and she had no other significant CAD. Had event monitor placed in Jan 2013 that showed sinus rhythm. Carotid Dopplers December 2012 showed 0-39% bilateral stenosis. 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 Myoview in November of 2013 showed an ejection fraction of 73%. There was no ischemia. Patient has continued to have chest pain. She had repeat catheterization in May of 2014 which revealed normal left main, patent LAD stent, 50% mid LAD. There was a 30-40% ostial circumflex. Ejection fraction was 55%. I last saw her in May of 2014. Since then, she occasionally has a twinge of chest pain when she is stressed. Dyspnea with more extreme activities but not routine activities. No exertional chest pain.   Current Outpatient Prescriptions  Medication Sig Dispense Refill  . aspirin EC 81 MG tablet Take 81 mg by mouth every evening.      Marland Kitchen CRESTOR 20 MG tablet TAKE 1 TABLET EVERY NIGHT  90 tablet  1  . EFFIENT 10 MG TABS tablet TAKE 1 TABLET DAILY  3 tablet  1  . ibuprofen (ADVIL,MOTRIN) 200 MG tablet Take 200-400 mg by mouth every 6 (six) hours as needed for pain or headache.      . nitroGLYCERIN (NITROSTAT) 0.4 MG SL tablet Place 1 tablet (0.4 mg total) under the tongue every 5 (five) minutes as needed for chest pain.  25 tablet  12  .  prasugrel (EFFIENT) 10 MG TABS Take 10 mg by mouth every morning.      . rosuvastatin (CRESTOR) 20 MG tablet Take 20 mg by mouth every evening.       No current facility-administered medications for this visit.     Past Medical History  Diagnosis Date  . ALLERGIC RHINITIS 03/02/2007  . BREAST CYST, LEFT 11/23/2009  . 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 CAD (coronary artery disease)     s/p DES to LAD 9/12  . HLD (hyperlipidemia)     Past Surgical History  Procedure Laterality Date  . Breast surgery      BILAT IMPLANTS  . Breast biopsy    . Heart stent  01/2011    Dr. Jens Som  . Augmentation mammaplasty Bilateral 1990    History   Social History  . Marital Status: Married    Spouse Name: N/A    Number of Children: N/A  . 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 Smoking status: Never Smoker   . Smokeless tobacco: Never Used  . Alcohol Use: Yes     Comment: Occasional beer  . Drug Use: No  . Sexual Activity: Not Currently   Other Topics Concern  . Not on file   Social History Narrative   Occupation:    Never Smoked   Alcohol use- no   Married  Drug use- no   Regular Exercise- yes     ROS: no fevers or chills, productive cough, hemoptysis, dysphasia, odynophagia, melena, hematochezia, dysuria, hematuria, rash, seizure activity, orthopnea, PND, pedal edema, claudication. Remaining systems are negative.  Physical Exam: Well-developed well-nourished in no acute distress.  Skin is warm and dry.  HEENT is normal.  Neck is supple.  Chest is clear to auscultation with normal expansion.  Cardiovascular exam is regular rate and rhythm.  Abdominal exam nontender or distended. No masses palpated. Extremities show no edema. neuro grossly intact  ECG sinus rhythm with no ST changes.

## 2013-03-18 NOTE — Assessment & Plan Note (Signed)
Continue aspirin and statin. Discontinue effient. 

## 2013-03-19 ENCOUNTER — Encounter (HOSPITAL_COMMUNITY): Payer: 59

## 2013-03-22 ENCOUNTER — Encounter (HOSPITAL_COMMUNITY): Payer: 59

## 2013-03-24 ENCOUNTER — Encounter (HOSPITAL_COMMUNITY): Payer: 59

## 2013-03-26 ENCOUNTER — Encounter (HOSPITAL_COMMUNITY): Payer: 59

## 2013-03-29 ENCOUNTER — Encounter (HOSPITAL_COMMUNITY): Payer: 59

## 2013-03-31 ENCOUNTER — Encounter (HOSPITAL_COMMUNITY): Payer: 59

## 2013-04-05 ENCOUNTER — Encounter (HOSPITAL_COMMUNITY): Payer: 59 | Attending: Cardiology

## 2013-04-05 DIAGNOSIS — Z5189 Encounter for other specified aftercare: Secondary | ICD-10-CM | POA: Insufficient documentation

## 2013-04-05 DIAGNOSIS — I2510 Atherosclerotic heart disease of native coronary artery without angina pectoris: Secondary | ICD-10-CM | POA: Insufficient documentation

## 2013-04-05 DIAGNOSIS — Z9861 Coronary angioplasty status: Secondary | ICD-10-CM | POA: Insufficient documentation

## 2013-04-05 DIAGNOSIS — E785 Hyperlipidemia, unspecified: Secondary | ICD-10-CM | POA: Insufficient documentation

## 2013-04-05 DIAGNOSIS — R000 Tachycardia, unspecified: Secondary | ICD-10-CM | POA: Insufficient documentation

## 2013-04-05 DIAGNOSIS — I200 Unstable angina: Secondary | ICD-10-CM | POA: Insufficient documentation

## 2013-04-05 DIAGNOSIS — I447 Left bundle-branch block, unspecified: Secondary | ICD-10-CM | POA: Insufficient documentation

## 2013-04-07 ENCOUNTER — Encounter (HOSPITAL_COMMUNITY): Payer: 59

## 2013-04-09 ENCOUNTER — Encounter (HOSPITAL_COMMUNITY): Payer: 59

## 2013-04-12 ENCOUNTER — Encounter (HOSPITAL_COMMUNITY): Payer: 59

## 2013-04-14 ENCOUNTER — Encounter (HOSPITAL_COMMUNITY): Payer: 59

## 2013-04-16 ENCOUNTER — Encounter (HOSPITAL_COMMUNITY): Payer: 59

## 2013-04-19 ENCOUNTER — Encounter (HOSPITAL_COMMUNITY): Payer: 59

## 2013-04-21 ENCOUNTER — Encounter (HOSPITAL_COMMUNITY): Payer: 59

## 2013-04-23 ENCOUNTER — Encounter (HOSPITAL_COMMUNITY): Payer: 59

## 2013-04-26 ENCOUNTER — Encounter (HOSPITAL_COMMUNITY): Payer: 59

## 2013-04-28 ENCOUNTER — Encounter (HOSPITAL_COMMUNITY): Payer: 59

## 2013-05-03 ENCOUNTER — Encounter (HOSPITAL_COMMUNITY): Payer: 59

## 2013-05-04 ENCOUNTER — Telehealth (HOSPITAL_COMMUNITY): Payer: Self-pay | Admitting: *Deleted

## 2013-05-05 ENCOUNTER — Encounter (HOSPITAL_COMMUNITY): Payer: 59

## 2013-05-17 ENCOUNTER — Encounter (HOSPITAL_COMMUNITY): Payer: Self-pay | Attending: Cardiology

## 2013-05-17 DIAGNOSIS — E785 Hyperlipidemia, unspecified: Secondary | ICD-10-CM | POA: Insufficient documentation

## 2013-05-17 DIAGNOSIS — I447 Left bundle-branch block, unspecified: Secondary | ICD-10-CM | POA: Insufficient documentation

## 2013-05-17 DIAGNOSIS — Z9861 Coronary angioplasty status: Secondary | ICD-10-CM | POA: Insufficient documentation

## 2013-05-17 DIAGNOSIS — Z5189 Encounter for other specified aftercare: Secondary | ICD-10-CM | POA: Insufficient documentation

## 2013-05-17 DIAGNOSIS — I200 Unstable angina: Secondary | ICD-10-CM | POA: Insufficient documentation

## 2013-05-17 DIAGNOSIS — I2510 Atherosclerotic heart disease of native coronary artery without angina pectoris: Secondary | ICD-10-CM | POA: Insufficient documentation

## 2013-05-17 DIAGNOSIS — R000 Tachycardia, unspecified: Secondary | ICD-10-CM | POA: Insufficient documentation

## 2013-05-19 ENCOUNTER — Encounter (HOSPITAL_COMMUNITY): Payer: Self-pay

## 2013-05-21 ENCOUNTER — Encounter (HOSPITAL_COMMUNITY): Payer: Self-pay

## 2013-05-24 ENCOUNTER — Encounter (HOSPITAL_COMMUNITY): Payer: Self-pay

## 2013-05-26 ENCOUNTER — Encounter (HOSPITAL_COMMUNITY): Payer: Self-pay

## 2013-05-28 ENCOUNTER — Encounter (HOSPITAL_COMMUNITY): Payer: Self-pay

## 2013-05-31 ENCOUNTER — Encounter (HOSPITAL_COMMUNITY): Payer: Self-pay

## 2013-06-02 ENCOUNTER — Encounter (HOSPITAL_COMMUNITY): Payer: Self-pay

## 2013-06-04 ENCOUNTER — Encounter (HOSPITAL_COMMUNITY): Payer: Self-pay

## 2013-06-07 ENCOUNTER — Encounter (HOSPITAL_COMMUNITY): Payer: 59 | Attending: Cardiology

## 2013-06-07 DIAGNOSIS — Z5189 Encounter for other specified aftercare: Secondary | ICD-10-CM | POA: Insufficient documentation

## 2013-06-07 DIAGNOSIS — R000 Tachycardia, unspecified: Secondary | ICD-10-CM | POA: Insufficient documentation

## 2013-06-07 DIAGNOSIS — Z9861 Coronary angioplasty status: Secondary | ICD-10-CM | POA: Insufficient documentation

## 2013-06-07 DIAGNOSIS — E785 Hyperlipidemia, unspecified: Secondary | ICD-10-CM | POA: Insufficient documentation

## 2013-06-07 DIAGNOSIS — I200 Unstable angina: Secondary | ICD-10-CM | POA: Insufficient documentation

## 2013-06-07 DIAGNOSIS — I447 Left bundle-branch block, unspecified: Secondary | ICD-10-CM | POA: Insufficient documentation

## 2013-06-07 DIAGNOSIS — I2510 Atherosclerotic heart disease of native coronary artery without angina pectoris: Secondary | ICD-10-CM | POA: Insufficient documentation

## 2013-06-09 ENCOUNTER — Encounter (HOSPITAL_COMMUNITY): Payer: Self-pay

## 2013-06-11 ENCOUNTER — Encounter (HOSPITAL_COMMUNITY): Payer: Self-pay

## 2013-06-14 ENCOUNTER — Encounter (HOSPITAL_COMMUNITY): Payer: Self-pay

## 2013-06-16 ENCOUNTER — Encounter (HOSPITAL_COMMUNITY): Payer: Self-pay

## 2013-06-18 ENCOUNTER — Other Ambulatory Visit: Payer: Self-pay | Admitting: Cardiology

## 2013-06-18 ENCOUNTER — Encounter (HOSPITAL_COMMUNITY): Payer: Self-pay

## 2013-06-21 ENCOUNTER — Encounter (HOSPITAL_COMMUNITY): Admission: RE | Admit: 2013-06-21 | Payer: Self-pay | Source: Ambulatory Visit

## 2013-06-23 ENCOUNTER — Encounter (HOSPITAL_COMMUNITY): Payer: Self-pay

## 2013-06-25 ENCOUNTER — Encounter (HOSPITAL_COMMUNITY): Payer: Self-pay

## 2013-06-28 ENCOUNTER — Encounter (HOSPITAL_COMMUNITY): Payer: Self-pay

## 2013-06-30 ENCOUNTER — Encounter (HOSPITAL_COMMUNITY): Payer: Self-pay

## 2013-07-02 ENCOUNTER — Encounter (HOSPITAL_COMMUNITY): Payer: Self-pay

## 2013-07-05 ENCOUNTER — Encounter (HOSPITAL_COMMUNITY): Payer: 59 | Attending: Cardiology

## 2013-07-05 DIAGNOSIS — I200 Unstable angina: Secondary | ICD-10-CM | POA: Insufficient documentation

## 2013-07-05 DIAGNOSIS — Z9861 Coronary angioplasty status: Secondary | ICD-10-CM | POA: Insufficient documentation

## 2013-07-05 DIAGNOSIS — E785 Hyperlipidemia, unspecified: Secondary | ICD-10-CM | POA: Insufficient documentation

## 2013-07-05 DIAGNOSIS — R000 Tachycardia, unspecified: Secondary | ICD-10-CM | POA: Insufficient documentation

## 2013-07-05 DIAGNOSIS — Z5189 Encounter for other specified aftercare: Secondary | ICD-10-CM | POA: Insufficient documentation

## 2013-07-05 DIAGNOSIS — I447 Left bundle-branch block, unspecified: Secondary | ICD-10-CM | POA: Insufficient documentation

## 2013-07-05 DIAGNOSIS — I2510 Atherosclerotic heart disease of native coronary artery without angina pectoris: Secondary | ICD-10-CM | POA: Insufficient documentation

## 2013-07-07 ENCOUNTER — Encounter (HOSPITAL_COMMUNITY): Payer: Self-pay

## 2013-07-09 ENCOUNTER — Encounter (HOSPITAL_COMMUNITY): Payer: Self-pay

## 2013-07-12 ENCOUNTER — Encounter (HOSPITAL_COMMUNITY): Payer: Self-pay

## 2013-07-13 ENCOUNTER — Telehealth (HOSPITAL_COMMUNITY): Payer: Self-pay | Admitting: *Deleted

## 2013-07-14 ENCOUNTER — Encounter (HOSPITAL_COMMUNITY): Payer: Self-pay

## 2013-07-16 ENCOUNTER — Encounter (HOSPITAL_COMMUNITY): Payer: Self-pay

## 2013-07-19 ENCOUNTER — Encounter (HOSPITAL_COMMUNITY): Payer: Self-pay

## 2013-07-21 ENCOUNTER — Encounter (HOSPITAL_COMMUNITY): Payer: Self-pay

## 2013-07-23 ENCOUNTER — Encounter (HOSPITAL_COMMUNITY): Payer: Self-pay

## 2013-07-26 ENCOUNTER — Encounter (HOSPITAL_COMMUNITY): Payer: Self-pay

## 2013-07-28 ENCOUNTER — Encounter (HOSPITAL_COMMUNITY): Payer: Self-pay

## 2013-07-30 ENCOUNTER — Encounter (HOSPITAL_COMMUNITY): Payer: Self-pay

## 2013-08-02 ENCOUNTER — Encounter (HOSPITAL_COMMUNITY): Payer: Self-pay

## 2013-08-04 ENCOUNTER — Encounter (HOSPITAL_COMMUNITY): Payer: Self-pay

## 2013-08-06 ENCOUNTER — Encounter (HOSPITAL_COMMUNITY): Payer: Self-pay

## 2013-08-09 ENCOUNTER — Encounter (HOSPITAL_COMMUNITY): Payer: Self-pay

## 2013-08-11 ENCOUNTER — Encounter (HOSPITAL_COMMUNITY): Payer: Self-pay

## 2013-08-13 ENCOUNTER — Encounter (HOSPITAL_COMMUNITY): Payer: Self-pay

## 2013-08-16 ENCOUNTER — Encounter (HOSPITAL_COMMUNITY): Payer: Self-pay

## 2013-08-18 ENCOUNTER — Encounter (HOSPITAL_COMMUNITY): Payer: Self-pay

## 2013-08-20 ENCOUNTER — Encounter (HOSPITAL_COMMUNITY): Payer: Self-pay

## 2013-08-23 ENCOUNTER — Encounter (HOSPITAL_COMMUNITY): Payer: Self-pay

## 2013-08-25 ENCOUNTER — Encounter (HOSPITAL_COMMUNITY): Payer: Self-pay

## 2013-08-27 ENCOUNTER — Encounter (HOSPITAL_COMMUNITY): Payer: Self-pay

## 2013-08-30 ENCOUNTER — Encounter (HOSPITAL_COMMUNITY): Payer: Self-pay

## 2013-09-01 ENCOUNTER — Encounter (HOSPITAL_COMMUNITY): Payer: Self-pay

## 2013-09-03 ENCOUNTER — Encounter (HOSPITAL_COMMUNITY): Payer: Self-pay

## 2013-09-06 ENCOUNTER — Encounter (HOSPITAL_COMMUNITY): Payer: Self-pay

## 2013-09-08 ENCOUNTER — Encounter (HOSPITAL_COMMUNITY): Payer: Self-pay

## 2013-09-10 ENCOUNTER — Encounter (HOSPITAL_COMMUNITY): Payer: Self-pay

## 2013-09-13 ENCOUNTER — Encounter (HOSPITAL_COMMUNITY): Payer: Self-pay

## 2013-09-15 ENCOUNTER — Encounter (HOSPITAL_COMMUNITY): Payer: Self-pay

## 2013-09-16 ENCOUNTER — Other Ambulatory Visit: Payer: Self-pay | Admitting: Cardiology

## 2013-09-17 ENCOUNTER — Encounter (HOSPITAL_COMMUNITY): Payer: Self-pay

## 2013-09-20 ENCOUNTER — Encounter (HOSPITAL_COMMUNITY): Payer: Self-pay

## 2013-09-22 ENCOUNTER — Encounter (HOSPITAL_COMMUNITY): Payer: Self-pay

## 2013-09-24 ENCOUNTER — Encounter (HOSPITAL_COMMUNITY): Payer: Self-pay

## 2013-09-29 ENCOUNTER — Encounter (HOSPITAL_COMMUNITY): Payer: Self-pay

## 2013-09-30 ENCOUNTER — Other Ambulatory Visit: Payer: Self-pay

## 2013-09-30 DIAGNOSIS — Z1231 Encounter for screening mammogram for malignant neoplasm of breast: Secondary | ICD-10-CM

## 2013-10-01 ENCOUNTER — Encounter (HOSPITAL_COMMUNITY): Payer: Self-pay

## 2013-10-04 ENCOUNTER — Encounter (HOSPITAL_COMMUNITY): Payer: Self-pay

## 2013-10-06 ENCOUNTER — Encounter (HOSPITAL_COMMUNITY): Payer: Self-pay

## 2013-10-08 ENCOUNTER — Encounter (INDEPENDENT_AMBULATORY_CARE_PROVIDER_SITE_OTHER): Payer: Self-pay

## 2013-10-08 ENCOUNTER — Encounter (HOSPITAL_COMMUNITY): Payer: Self-pay

## 2013-10-08 ENCOUNTER — Ambulatory Visit
Admission: RE | Admit: 2013-10-08 | Discharge: 2013-10-08 | Disposition: A | Discharge status: Home / Self Care | Payer: 59 | Source: Ambulatory Visit

## 2013-10-08 DIAGNOSIS — Z1231 Encounter for screening mammogram for malignant neoplasm of breast: Secondary | ICD-10-CM

## 2013-10-11 ENCOUNTER — Encounter (HOSPITAL_COMMUNITY): Payer: Self-pay

## 2013-10-13 ENCOUNTER — Encounter (HOSPITAL_COMMUNITY): Payer: Self-pay

## 2013-10-15 ENCOUNTER — Encounter (HOSPITAL_COMMUNITY): Payer: Self-pay

## 2013-10-18 ENCOUNTER — Encounter (HOSPITAL_COMMUNITY): Payer: Self-pay

## 2013-10-20 ENCOUNTER — Encounter (HOSPITAL_COMMUNITY): Payer: Self-pay

## 2013-10-22 ENCOUNTER — Encounter (HOSPITAL_COMMUNITY): Payer: Self-pay

## 2013-10-25 ENCOUNTER — Encounter (HOSPITAL_COMMUNITY): Payer: Self-pay

## 2013-10-27 ENCOUNTER — Encounter (HOSPITAL_COMMUNITY): Payer: Self-pay

## 2013-10-29 ENCOUNTER — Encounter (HOSPITAL_COMMUNITY): Payer: Self-pay

## 2013-11-01 ENCOUNTER — Encounter (HOSPITAL_COMMUNITY): Payer: Self-pay

## 2013-11-03 ENCOUNTER — Encounter (HOSPITAL_COMMUNITY): Payer: Self-pay

## 2013-11-05 ENCOUNTER — Encounter (HOSPITAL_COMMUNITY): Payer: Self-pay

## 2013-11-08 ENCOUNTER — Encounter (HOSPITAL_COMMUNITY): Payer: Self-pay

## 2013-11-10 ENCOUNTER — Encounter (HOSPITAL_COMMUNITY): Payer: Self-pay

## 2013-11-12 ENCOUNTER — Encounter (HOSPITAL_COMMUNITY): Payer: Self-pay

## 2013-11-15 ENCOUNTER — Encounter (HOSPITAL_COMMUNITY): Payer: Self-pay

## 2013-11-17 ENCOUNTER — Encounter (HOSPITAL_COMMUNITY): Payer: Self-pay

## 2013-11-19 ENCOUNTER — Encounter (HOSPITAL_COMMUNITY): Payer: Self-pay

## 2013-11-22 ENCOUNTER — Encounter (HOSPITAL_COMMUNITY): Payer: Self-pay

## 2013-11-24 ENCOUNTER — Encounter (HOSPITAL_COMMUNITY): Payer: Self-pay

## 2013-11-26 ENCOUNTER — Encounter (HOSPITAL_COMMUNITY): Payer: Self-pay

## 2013-11-29 ENCOUNTER — Encounter (HOSPITAL_COMMUNITY): Payer: Self-pay

## 2013-12-01 ENCOUNTER — Encounter (HOSPITAL_COMMUNITY): Payer: Self-pay

## 2013-12-03 ENCOUNTER — Encounter (HOSPITAL_COMMUNITY): Payer: Self-pay

## 2013-12-06 ENCOUNTER — Telehealth: Payer: Self-pay | Admitting: Cardiology

## 2013-12-06 NOTE — Telephone Encounter (Signed)
Calling because she states that she is having some pain like she had before she had to have a stent ploaced back in 2012. Would like to speak to Hilda Blades about this .Marland Kitchen Please call .Marland Kitchen

## 2013-12-06 NOTE — Telephone Encounter (Signed)
Spoke with pt, she has developed right shoulder pain since Sunday. This is the exact type of pain she had prior to the 2012 stenting she had. The discomfort is consistent and is worse with movement. There has been no change in the discomfort with tylenol or ibuprofen. Discussed with dayna dunn [facility], pt advised to go to the ER for eval. Patient voiced understanding

## 2013-12-08 ENCOUNTER — Encounter: Payer: Self-pay | Admitting: Cardiology

## 2013-12-08 ENCOUNTER — Encounter (HOSPITAL_COMMUNITY): Payer: Self-pay | Admitting: Pharmacy Technician

## 2013-12-08 ENCOUNTER — Encounter (HOSPITAL_COMMUNITY): Payer: Self-pay | Admitting: Cardiology

## 2013-12-08 ENCOUNTER — Ambulatory Visit (INDEPENDENT_AMBULATORY_CARE_PROVIDER_SITE_OTHER): Payer: 59 | Admitting: Cardiology

## 2013-12-08 ENCOUNTER — Observation Stay (HOSPITAL_COMMUNITY)
Admission: AD | Admit: 2013-12-08 | Discharge: 2013-12-09 | Disposition: A | Discharge status: Home / Self Care | Payer: 59 | Source: Ambulatory Visit | Attending: Cardiology | Admitting: Cardiology

## 2013-12-08 VITALS — BP 128/98 | HR 72 | Ht 65.0 in | Wt 134.0 lb

## 2013-12-08 DIAGNOSIS — I739 Peripheral vascular disease, unspecified: Secondary | ICD-10-CM | POA: Diagnosis present

## 2013-12-08 DIAGNOSIS — I2510 Atherosclerotic heart disease of native coronary artery without angina pectoris: Secondary | ICD-10-CM | POA: Diagnosis not present

## 2013-12-08 DIAGNOSIS — I200 Unstable angina: Secondary | ICD-10-CM | POA: Diagnosis present

## 2013-12-08 DIAGNOSIS — I25110 Atherosclerotic heart disease of native coronary artery with unstable angina pectoris: Secondary | ICD-10-CM | POA: Diagnosis present

## 2013-12-08 DIAGNOSIS — Z9861 Coronary angioplasty status: Secondary | ICD-10-CM | POA: Insufficient documentation

## 2013-12-08 DIAGNOSIS — E785 Hyperlipidemia, unspecified: Secondary | ICD-10-CM | POA: Diagnosis not present

## 2013-12-08 DIAGNOSIS — Z7982 Long term (current) use of aspirin: Secondary | ICD-10-CM | POA: Insufficient documentation

## 2013-12-08 DIAGNOSIS — M25511 Pain in right shoulder: Secondary | ICD-10-CM | POA: Diagnosis present

## 2013-12-08 DIAGNOSIS — R079 Chest pain, unspecified: Secondary | ICD-10-CM

## 2013-12-08 DIAGNOSIS — M25519 Pain in unspecified shoulder: Secondary | ICD-10-CM

## 2013-12-08 DIAGNOSIS — I25750 Atherosclerosis of native coronary artery of transplanted heart with unstable angina: Secondary | ICD-10-CM

## 2013-12-08 LAB — COMPREHENSIVE METABOLIC PANEL
ALT: 22 U/L (ref 0–35)
ANION GAP: 13 (ref 5–15)
AST: 22 U/L (ref 0–37)
Albumin: 4.6 g/dL (ref 3.5–5.2)
Alkaline Phosphatase: 53 U/L (ref 39–117)
BUN: 17 mg/dL (ref 6–23)
CO2: 27 mEq/L (ref 19–32)
CREATININE: 0.81 mg/dL (ref 0.50–1.10)
Calcium: 9.7 mg/dL (ref 8.4–10.5)
Chloride: 101 mEq/L (ref 96–112)
GFR calc non Af Amer: 79 mL/min — ABNORMAL LOW (ref >90)
GLUCOSE: 87 mg/dL (ref 70–99)
POTASSIUM: 4.1 meq/L (ref 3.7–5.3)
Sodium: 141 mEq/L (ref 137–147)
TOTAL PROTEIN: 8.4 g/dL — AB (ref 6.0–8.3)
Total Bilirubin: 0.7 mg/dL (ref 0.3–1.2)

## 2013-12-08 LAB — TROPONIN I: Troponin I: <0.3 ng/mL (ref <0.30)

## 2013-12-08 MED ORDER — ONDANSETRON HCL 4 MG/2ML IJ SOLN: 4.0000 mg | Freq: Four times a day (QID) | INTRAMUSCULAR | Status: DC | PRN

## 2013-12-08 MED ORDER — SODIUM CHLORIDE 0.9 % IJ SOLN: 3.0000 mL | Freq: Two times a day (BID) | INTRAMUSCULAR | Status: DC

## 2013-12-08 MED ORDER — HEPARIN BOLUS VIA INFUSION
4000.0000 [IU] | Freq: Once | INTRAVENOUS | Status: AC
  Administered 2013-12-08: 4000 [IU] via INTRAVENOUS
  Filled 2013-12-08: qty 4000

## 2013-12-08 MED ORDER — ZOLPIDEM TARTRATE 5 MG PO TABS
5.0000 mg | ORAL_TABLET | Freq: Once | ORAL | Status: AC
  Administered 2013-12-08: 5 mg via ORAL
  Filled 2013-12-08: qty 1

## 2013-12-08 MED ORDER — SODIUM CHLORIDE 0.9 % IV SOLN
INTRAVENOUS | Status: DC
  Administered 2013-12-08 – 2013-12-09 (×2): via INTRAVENOUS

## 2013-12-08 MED ORDER — ACETAMINOPHEN 325 MG PO TABS
650.0000 mg | ORAL_TABLET | ORAL | Status: DC | PRN
  Administered 2013-12-08: 650 mg via ORAL
  Filled 2013-12-08: qty 2

## 2013-12-08 MED ORDER — METOPROLOL TARTRATE 12.5 MG HALF TABLET
12.5000 mg | ORAL_TABLET | Freq: Two times a day (BID) | ORAL | Status: DC
  Administered 2013-12-09: 12.5 mg via ORAL
  Filled 2013-12-08 (×3): qty 1

## 2013-12-08 MED ORDER — ASPIRIN 81 MG PO CHEW
324.0000 mg | CHEWABLE_TABLET | ORAL | Status: AC
  Administered 2013-12-08: 243 mg via ORAL
  Filled 2013-12-08: qty 4

## 2013-12-08 MED ORDER — SODIUM CHLORIDE 0.9 % IJ SOLN: 3.0000 mL | INTRAMUSCULAR | Status: DC | PRN

## 2013-12-08 MED ORDER — NITROGLYCERIN 0.4 MG SL SUBL: 0.4000 mg | SUBLINGUAL_TABLET | SUBLINGUAL | Status: DC | PRN

## 2013-12-08 MED ORDER — SODIUM CHLORIDE 0.9 % IV SOLN: 250.0000 mL | INTRAVENOUS | Status: DC | PRN

## 2013-12-08 MED ORDER — ASPIRIN EC 81 MG PO TBEC: 81.0000 mg | DELAYED_RELEASE_TABLET | Freq: Every evening | ORAL | Status: DC

## 2013-12-08 MED ORDER — ASPIRIN 300 MG RE SUPP: 300.0000 mg | RECTAL | Status: AC

## 2013-12-08 MED ORDER — ATORVASTATIN CALCIUM 40 MG PO TABS
40.0000 mg | ORAL_TABLET | Freq: Every day | ORAL | Status: DC
  Administered 2013-12-09: 40 mg via ORAL
  Filled 2013-12-08 (×2): qty 1

## 2013-12-08 MED ORDER — HEPARIN (PORCINE) IN NACL 100-0.45 UNIT/ML-% IJ SOLN
750.0000 [IU]/h | INTRAMUSCULAR | Status: DC
  Administered 2013-12-08: 850 [IU]/h via INTRAVENOUS
  Filled 2013-12-08: qty 250

## 2013-12-08 MED ORDER — ASPIRIN 81 MG PO CHEW
81.0000 mg | CHEWABLE_TABLET | ORAL | Status: AC
  Administered 2013-12-09: 81 mg via ORAL
  Filled 2013-12-08: qty 1

## 2013-12-08 NOTE — Progress Notes (Signed)
Patient ID: Angelica [name], female   DOB: [birth year], 58 y.o.   MRN: [id number] CC: right posterior shoulder pain  HPI: The patient is a 58 y/o female, followed by Dr. Stanford Breed. She has a h/o CAD. She was first evaluated in 2012 for chest/posterior shoulder pain. Stress echo was markedly abnormal with nonsustained ventricular tachycardia and ischemia in the anteroseptal wall and apex. Patient underwent cardiac catheterization in September of 2012 which revealed a 95% ostial LAD lesion. The patient had PCI with a drug-eluting stent. There was some jailing of the circumflex. There was recommendation for aspirin indefinitely and effient for at least one year and possibly indefinitely. Repeat cath at [facility] in Dec 2012 because of palpitations and chest pain revealed patency of LAD stent and she had no other significant CAD. Had event monitor placed in Jan 2013 that showed sinus rhythm. Carotid Dopplers December 2012 showed 0-39% bilateral stenosis. 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 Myoview in November of 2013 showed an ejection fraction of 73%. There was no ischemia. However, the patient continued to have chest pain. She had repeat catheterization in May of 2014 which revealed normal left main, patent LAD stent, 50% mid LAD. There was a 30-40% ostial circumflex. Ejection fraction was 55%.   Today she reports recurrence of posterior right shoulder pain. She notes she is not sure if she pulled a muscle. Notes 3 years ago that she had this pain. Was much more severe 3 years ago. Had normal Troponins at that time. Was seen in ED multiple times and sent home with treatment for muscle issue. Noted to have 95% blockage in LAD. Husband notes had 2 other 35% blockages at that time. Last cath in 09/2012.  Notes she woke up Sunday morning with pain in posterior shoulder. This has not gotten better or worse. Hurts with yawning and deep breath. Does not hurt with  movement. No injury noted. She does note that she lifted her 45 lb dog on Saturday. No radiation to jaw or arm. No chest pain. Has noted being more winded than usual over the past few days with walking up hills. The pain is intermittently minimally worse with exertion. Has taken ibuprofen for this with no benefit. Has not taken any nitro with this. Notes this area feels like a big ball of sharp pressure that is worse when she breathes. Pain is there at low level all the time. Spikes to higher level of pain. Has had occasional diaphoresis with this. She denies swelling in legs and leg pain.  She notes she has been under significant stress at work during this time. She is not a smoker, hypertensive, or diabetic.  Past Medical History  Diagnosis Date  . ALLERGIC RHINITIS 03/02/2007  . BREAST CYST, LEFT 11/23/2009  . 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 CAD (coronary artery disease)     s/p DES to LAD 9/12  . HLD (hyperlipidemia)     Past Surgical History  Procedure Laterality Date  . Breast surgery      BILAT IMPLANTS  . Breast biopsy    . Heart stent  01/2011    Dr. Stanford Breed  . Augmentation mammaplasty Bilateral 1990    Current Outpatient Prescriptions  Medication Sig Dispense Refill  . aspirin EC 81 MG tablet Take 81 mg by mouth every evening.      Marland Kitchen CRESTOR 20 MG tablet TAKE 1 TABLET EVERY NIGHT  90 tablet  0  . ibuprofen (  ADVIL,MOTRIN) 200 MG tablet Take 200-400 mg by mouth every 6 (six) hours as needed for pain or headache.      . nitroGLYCERIN (NITROSTAT) 0.4 MG SL tablet Place 1 tablet (0.4 mg total) under the tongue every 5 (five) minutes as needed for chest pain.  25 tablet  12  . rosuvastatin (CRESTOR) 20 MG tablet Take 20 mg by mouth every evening.       No current facility-administered medications for this visit.    Allergies  Allergen Reactions  . Prednisone Palpitations    ROS: noted in HPI  BP 128/98  Pulse 72  Ht 5\' 5"  (1.651 m)  Wt 134 lb (60.782 kg)  BMI  22.30 kg/m2 Walking O2 sat ranged between 97% to start and 93% at finish  PHYSICAL EXAM Gen: NAD, sitting on table comfortably HEENT: MMM Neck: no JVD, no carotid bruits CV: rrr, no murmurs, rubs, or clicks Pulm: CTAB, no wheezes or rales MSK: no swelling of back, no tenderness of back, no pain with ROM of either shoulder Ext: no edema Skin: no noted lesions   EKG: NSR, no changes in ST or T waves, rate of 72  Labs: No results found for this or any previous visit (from the past 24 hour(s)).   Assessment and Plan:  1. Right posterior shoulder pain: patient with history significant for CAD with stent placed in 2012. Most recent catheterization was May 2014 and revealed a 50% mid-LAD lesion and a patent proximal LAD stent. Differential for her symptoms at this time include ischemia (possible given patient history and the fact that her pain worsened in the office after 3 laps around the office), PE (less likely given normal heart rate, normal work of breathing, and lack of hypoxia), MSK cause (less likely given no abnormalities on exam), or pulmonary issue (less likely given normal pulmonary exam, afebrile, and normal pulmonary exam). At this time the concern would be for a cardiac cause given prior history. We will direct admit the patient from clinic to a telemetry bed. Will plan to start on heparin drip on admission and plan for cardiac catheterization tomorrow. Will cycle troponins and place on cardiac monitoring.   Tommi Rumps, MD [place] PGY-3   I have seen and examined the patient along with Dr. Biagio Quint and Dr. Percival Spanish. I agree with assessment and plan as outlined above. The patient has right posterior shoulder pain, described as a "tight" sensation that is similar to her prior anginal symptoms, although she has had no radiation to her neck and jaw, which also occurred in 2013. During her office visit today, I asked the patient to ambulate around the nurses  station 3 laps while checking O2 sats, which remained normal. However, after completing the 3 laps she endorsed increased right shoulder discomfort with the minimal exertion and she was grabbing her shoulder. Her EKG is non ischemic. However, given her history with known 50% mid LAD stenosis at time of last cath and symptomatology, I feel it is best to direct admit, place on IV heparin, monitor overnight and plan for a diagnotic LHC tomorrow with Dr. Ellyn Hack. Will also check cardiac enzymes, will hydrate overnight and will make NPO starting at midnight.   Lyda Jester, PA-C   History and all data above reviewed.  Patient examined.  I agree with the findings as above.  The patient has shoulder pain similar to her previous presentaiton at which time she had high grade stenosis.  The patient exam reveals  COR:RRR  ,  Lungs: Clear  ,  Abd: Positive bowel sounds, no rebound no guarding, Ext No edema   .  All available labs, radiology testing, previous records reviewed. Agree with documented assessment and plan. Shoulder pain:  Very difficult To differentiate from other discomfort but suspicious for unstable angina. His new onset. It is reproducible with exertion but not with movement. Given this I think is a high pretest probability of obstructive coronary disease and admission with cardiac catheterization is indicated. This was discussed with the patient. The patient understands that risks included but are not limited to stroke (1 in 1000), death (1 in 60), kidney failure [usually temporary] (1 in 500), bleeding (1 in 200), allergic reaction [possibly serious] (1 in 200).  The patient understands and agrees to proceed.   Of note she General she has had difficulty with catheterization from her radials in the past.  Minus Breeding  6:15 PM  12/08/2013

## 2013-12-08 NOTE — Progress Notes (Signed)
ANTICOAGULATION CONSULT NOTE - Initial Consult  Pharmacy Consult for heparin Indication: chest pain/ACS  Allergies  Allergen Reactions  . Prednisone Palpitations    Patient Measurements: Height: 5\' 5"  (165.1 cm) Weight: 134 lb (60.782 kg) IBW/kg (Calculated) : 57 Heparin Dosing Weight: 60 kg  Vital Signs: Temp: 98.6 F (37 C) (08/05 2130) BP: 140/92 mmHg (08/05 2130) Pulse Rate: 69 (08/05 2130)  Labs: No results found for this basename: HGB, HCT, PLT, APTT, LABPROT, INR, HEPARINUNFRC, CREATININE, CKTOTAL, CKMB, TROPONINI,  in the last 72 hours  Estimated Creatinine Clearance: 69.8 ml/min (by C-G formula based on Cr of 0.6).   Medical History: Past Medical History  Diagnosis Date  . ALLERGIC RHINITIS 03/02/2007  . BREAST CYST, LEFT 11/23/2009  . 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 CAD (coronary artery disease)     s/p DES to LAD 9/12  . HLD (hyperlipidemia)     Medications:  Prescriptions prior to admission  Medication Sig Dispense Refill  . aspirin EC 81 MG tablet Take 81 mg by mouth every evening.      Marland Kitchen ibuprofen (ADVIL,MOTRIN) 200 MG tablet Take 200-400 mg by mouth every 6 (six) hours as needed for pain or headache.      . nitroGLYCERIN (NITROSTAT) 0.4 MG SL tablet Place 0.4 mg under the tongue every 5 (five) minutes as needed for chest pain.      . rosuvastatin (CRESTOR) 20 MG tablet Take 20 mg by mouth every evening.        Assessment: 58 year old woman presents to her 46 office with chest pain.  Patient is now admitted for ACS and cardiac cath tomorrow.   Goal of Therapy:  Heparin level 0.3-0.7 units/ml Monitor platelets by anticoagulation protocol: Yes   Plan:  Give 4000 units bolus x 1 Start heparin infusion at 850 units/hr Check anti-Xa level in 6 hours and daily while on heparin Continue to monitor H&H and platelets  Candie Mile 12/08/2013,9:36 PM

## 2013-12-09 ENCOUNTER — Ambulatory Visit (HOSPITAL_COMMUNITY): Admit: 2013-12-09 | Payer: Self-pay | Admitting: Cardiology

## 2013-12-09 ENCOUNTER — Encounter (HOSPITAL_COMMUNITY)
Admission: AD | Disposition: A | Discharge status: Home / Self Care | Payer: 59 | Source: Ambulatory Visit | Attending: Cardiology

## 2013-12-09 ENCOUNTER — Encounter (HOSPITAL_COMMUNITY): Payer: Self-pay | Admitting: Physician Assistant

## 2013-12-09 DIAGNOSIS — I200 Unstable angina: Secondary | ICD-10-CM

## 2013-12-09 DIAGNOSIS — I2510 Atherosclerotic heart disease of native coronary artery without angina pectoris: Secondary | ICD-10-CM | POA: Diagnosis not present

## 2013-12-09 HISTORY — DX: Coronary angioplasty status: Z98.61

## 2013-12-09 HISTORY — PX: LEFT HEART CATHETERIZATION WITH CORONARY ANGIOGRAM: SHX5451

## 2013-12-09 HISTORY — DX: Atherosclerotic heart disease of native coronary artery without angina pectoris: I25.10

## 2013-12-09 LAB — LIPID PANEL
CHOL/HDL RATIO: 1.4 ratio
Cholesterol: 124 mg/dL (ref 0–200)
HDL: 86 mg/dL (ref >39)
LDL Cholesterol: 30 mg/dL (ref 0–99)
Triglycerides: 40 mg/dL (ref <150)
VLDL: 8 mg/dL (ref 0–40)

## 2013-12-09 LAB — PROTIME-INR
INR: 1.12 (ref 0.00–1.49)
Prothrombin Time: 14.4 seconds (ref 11.6–15.2)

## 2013-12-09 LAB — BASIC METABOLIC PANEL
Anion gap: 11 (ref 5–15)
BUN: 19 mg/dL (ref 6–23)
CALCIUM: 8.4 mg/dL (ref 8.4–10.5)
CO2: 23 mEq/L (ref 19–32)
CREATININE: 0.7 mg/dL (ref 0.50–1.10)
Chloride: 106 mEq/L (ref 96–112)
GFR calc Af Amer: >90 mL/min (ref >90)
GFR calc non Af Amer: >90 mL/min (ref >90)
GLUCOSE: 87 mg/dL (ref 70–99)
Potassium: 4.4 mEq/L (ref 3.7–5.3)
Sodium: 140 mEq/L (ref 137–147)

## 2013-12-09 LAB — CBC
HEMATOCRIT: 40.6 % (ref 36.0–46.0)
Hemoglobin: 13.4 g/dL (ref 12.0–15.0)
MCH: 33.3 pg (ref 26.0–34.0)
MCHC: 33 g/dL (ref 30.0–36.0)
MCV: 100.7 fL — ABNORMAL HIGH (ref 78.0–100.0)
Platelets: 256 10*3/uL (ref 150–400)
RBC: 4.03 MIL/uL (ref 3.87–5.11)
RDW: 13 % (ref 11.5–15.5)
WBC: 8.8 10*3/uL (ref 4.0–10.5)

## 2013-12-09 LAB — TROPONIN I: Troponin I: <0.3 ng/mL (ref <0.30)

## 2013-12-09 LAB — POCT ACTIVATED CLOTTING TIME: ACTIVATED CLOTTING TIME: 123 s

## 2013-12-09 LAB — HEPARIN LEVEL (UNFRACTIONATED): HEPARIN UNFRACTIONATED: 0.74 [IU]/mL — AB (ref 0.30–0.70)

## 2013-12-09 SURGERY — LEFT HEART CATHETERIZATION WITH CORONARY ANGIOGRAM
Anesthesia: LOCAL

## 2013-12-09 MED ORDER — MIDAZOLAM HCL 2 MG/2ML IJ SOLN
INTRAMUSCULAR | Status: AC
Start: 2013-12-09 — End: 2013-12-09
  Filled 2013-12-09: qty 2

## 2013-12-09 MED ORDER — NITROGLYCERIN 1 MG/10 ML FOR IR/CATH LAB
INTRA_ARTERIAL | Status: AC
  Filled 2013-12-09: qty 10

## 2013-12-09 MED ORDER — SODIUM CHLORIDE 0.9 % IJ SOLN: 3.0000 mL | Freq: Two times a day (BID) | INTRAMUSCULAR | Status: DC

## 2013-12-09 MED ORDER — FENTANYL CITRATE 0.05 MG/ML IJ SOLN
INTRAMUSCULAR | Status: AC
  Filled 2013-12-09: qty 2

## 2013-12-09 MED ORDER — MORPHINE SULFATE 2 MG/ML IJ SOLN: 1.0000 mg | INTRAMUSCULAR | Status: DC | PRN

## 2013-12-09 MED ORDER — SODIUM CHLORIDE 0.9 % IV SOLN: 1.0000 mL/kg/h | INTRAVENOUS | Status: AC

## 2013-12-09 MED ORDER — SODIUM CHLORIDE 0.9 % IV SOLN: 250.0000 mL | INTRAVENOUS | Status: DC | PRN

## 2013-12-09 MED ORDER — MIDAZOLAM HCL 2 MG/2ML IJ SOLN
INTRAMUSCULAR | Status: AC
  Filled 2013-12-09: qty 2

## 2013-12-09 MED ORDER — SODIUM CHLORIDE 0.9 % IJ SOLN: 3.0000 mL | INTRAMUSCULAR | Status: DC | PRN

## 2013-12-09 MED ORDER — LIDOCAINE HCL (PF) 1 % IJ SOLN
INTRAMUSCULAR | Status: AC
  Filled 2013-12-09: qty 30

## 2013-12-09 MED ORDER — HEPARIN (PORCINE) IN NACL 2-0.9 UNIT/ML-% IJ SOLN
INTRAMUSCULAR | Status: AC
  Filled 2013-12-09: qty 1000

## 2013-12-09 MED ORDER — VERAPAMIL HCL 2.5 MG/ML IV SOLN
INTRAVENOUS | Status: AC
  Filled 2013-12-09: qty 2

## 2013-12-09 MED ORDER — METOPROLOL TARTRATE 12.5 MG HALF TABLET: 12.5000 mg | ORAL_TABLET | Freq: Two times a day (BID) | ORAL | Status: DC

## 2013-12-09 MED ORDER — NITROGLYCERIN 0.4 MG SL SUBL: 0.4000 mg | SUBLINGUAL_TABLET | SUBLINGUAL | Status: DC | PRN

## 2013-12-09 NOTE — Discharge Summary (Signed)
Discharge Summary   Patient ID: Angelica [name],  MRN: [id number], DOB/AGE: 58-14-1957 58 y.o.  Admit date: 12/08/2013 Discharge date: 12/09/2013  Primary Care Provider: TODD,JEFFREY ALLEN Primary Cardiologist: Dr. Stanford Breed  Discharge Diagnoses Principal Problem:   Shoulder pain, right Active Problems:   CAD S/P percutaneous coronary angioplasty: Ostial LAD Promus DES 3.0 mm x 12 mm   Hyperlipidemia with target LDL less than 70   Peripheral vascular disease   Atherosclerotic heart disease of native coronary artery with unstable angina pectoris   Allergies Allergies  Allergen Reactions  . Prednisone Palpitations    Procedures  PROCEDURES PERFORMED:  Left Heart Catheterization with Native Coronary Angiography via Right Common Femoral Artery  Left Ventriculography  FINDINGS:  Hemodynamics:  Central Aortic Pressure / Mean: 105/56/74 mmHg  Left Ventricular Pressure / LVEDP: 106/6/3 mmHg Left Ventriculography:  EF: 60-65 %  Wall Motion: Hyperdynamic Coronary Anatomy:  Dominance: Right  Left Main: Normal caliber vessel that bifurcates into the LAD and Circumflex. There is minimal stent protrusion into the distal left main but no significant lesion noted. LAD: Normal caliber vessel with widely patent ostial stent that goes to the mid vessel. Just beyond the stent there is a slight step down prior to the vessel bifurcating into major first diagonal branch (D1) and the follow-on mid LAD which continues to course a tortuous manner down around the apex. There is a Medina Class 1 , 1, 1 bifurcation lesion of the LAD and D1 that is at the most 50% in the LAD and 30% in the ostial D1.  The remainder of the LAD is free of significant disease, just tortuous.  D1: Moderate to large caliber major branch with several small branches. It is tortuous but free of significant disease the multiple distal branches. Left Circumflex: Moderate to large caliber nondominant vessel which essentially  courses as a lateral obtuse marginal with a very small AV groove branch. There is an ostial roughly 30% stenosis from the stent extending into the LM from LAD, but not flow-limiting The vessel does not reach the distal inferoseptal and posterolateral wall. Minimal luminal irregularities  RCA: Large-caliber, dominant vessel that bifurcates distally into the Right Posterior Descending Artery (RPDA) and the Right Posterior AV Groove Branch (RPAV).  RPDA: Moderate caliber vessel that is tortuous and reaches two thirds the way to the apex. Minimal luminal irregularities  RPL Sysytem:The RPAV begins as a moderate caliber vessel that terminates as a major posterolateral branch. Minimal luminal irregularities MEDICATIONS:  Anesthesia: Local Lidocaine 14 ml Sedation: 3 mg IV Versed, 75 mcg IV fentanyl Omnipaque Contrast: 80 ml PATIENT DISPOSITION:  The patient was transferred to the PACU holding area in a hemodynamicaly stable, chest pain free condition.  The patient tolerated the procedure well, and there were no complications. EBL: < 5 ml  The patient was stable before, during, and after the procedure. POST-OPERATIVE DIAGNOSIS:  Widely patent Ostial LAD stent with stable moderate mid LAD ~40-50% lesion  Normal LVEF with normal LVEDP. PLAN OF CARE:  Standard post femoral cath care.  Anticipate d/c later today  Recommend non-invasive Myoview to evaluate for ischemia if Symptoms progress. The mid LAD does not appear flow limiting & remains stable.    Hospital Course  The patient is a 58 year old Caucasian female with past medical history of coronary artery disease. She underwent PCI with DES to a 95% ostial LAD lesion in September 2012. Patient underwent a repeat cardiac catheterization again in May 2014 which showed a normal left main,  patent LAD stent, 50% mid LAD stenosis. She presented to the office on 12/08/2013 with recurrence of posterior right shoulder pain which was similar to her previous  anginal symptoms. However this time there was no radiation to the shoulder or jaw. Patient ambulated in the office with increasing exertional pain. She was admitted from the office for unstable angina.   She eventually underwent cardiac catheterization in the morning of 12/09/2013 which revealed EF 60-65%, 40-50% mid LAD stenosis which has essentially unchanged from the last cardiac catheterization. According to the patient, she has been undergoing a lot of stress with working over 100 hours a week. She was seen post cardiac catheterization on 8/6 and deemed stable for discharge from cardiology perspective.  She will followup with Dr. Jacalyn Lefevre office in 1 to 2 months after discharge.   Discharge Vitals Blood pressure 114/74, pulse 62, temperature 97.4 F (36.3 C), temperature source Oral, resp. rate 18, height 5\' 5"  (1.651 m), weight 134 lb 12.8 oz (61.145 kg), SpO2 100.00%.  Filed Weights   12/08/13 2130 12/09/13 0535  Weight: 134 lb (60.782 kg) 134 lb 12.8 oz (61.145 kg)    Labs  CBC  Recent Labs  12/09/13 0323  WBC 8.8  HGB 13.4  HCT 40.6  MCV 100.7*  PLT 245   Basic Metabolic Panel  Recent Labs  12/08/13 2245 12/09/13 0323  NA 141 140  K 4.1 4.4  CL 101 106  CO2 27 23  GLUCOSE 87 87  BUN 17 19  CREATININE 0.81 0.70  CALCIUM 9.7 8.4   Liver Function Tests  Recent Labs  12/08/13 2245  AST 22  ALT 22  ALKPHOS 53  BILITOT 0.7  PROT 8.4*  ALBUMIN 4.6   No results found for this basename: LIPASE, AMYLASE,  in the last 72 hours Cardiac Enzymes  Recent Labs  12/08/13 2245 12/09/13 0323 12/09/13 0900  TROPONINI <0.30 <0.30 <0.30   Fasting Lipid Panel  Recent Labs  12/09/13 0323  CHOL 124  HDL 86  LDLCALC 30  TRIG 40  CHOLHDL 1.4   Disposition  Pt is being discharged home today in good condition.  Follow-up Plans & Appointments      Follow-up Information   Follow up with Kirk Ruths, MD. (Office will call to schedule follow up with Dr.  Stanford Breed in 1-2 month. Please call us to schedule if you do not hear from [place] in 2 business days)    Specialty:  Cardiology   Contact information:   [address] [place] [address] [phone]       Discharge Medications    Medication List    ASK your doctor about these medications       aspirin EC 81 MG tablet  Take 81 mg by mouth every evening.     ibuprofen 200 MG tablet  Commonly known as:  ADVIL,MOTRIN  Take 200-400 mg by mouth every 6 (six) hours as needed for pain or headache.     nitroGLYCERIN 0.4 MG SL tablet  Commonly known as:  NITROSTAT  Place 0.4 mg under the tongue every 5 (five) minutes as needed for chest pain.     rosuvastatin 20 MG tablet  Commonly known as:  CRESTOR  Take 20 mg by mouth every evening.         Duration of Discharge Encounter   Greater than 30 minutes including physician time.  Signed, Almyra Deforest PA-C 12/09/2013, 4:36 PM

## 2013-12-09 NOTE — CV Procedure (Signed)
CARDIAC CATHETERIZATION REPORT  NAME:  Angelica [name]   MRN: [id number] DOB:  [birth year]   ADMIT DATE: 12/08/2013 Procedure Date: 12/09/2013  INTERVENTIONAL CARDIOLOGIST: Leonie Man, M.D., MS PRIMARY CARE PROVIDER: Joycelyn Man, MD PRIMARY CARDIOLOGIST: Kirk Ruths, M.D. Referring providers: Ellen Henri, PA-C. and Dr. Minus Breeding  PATIENT:  Angelica [name] is a 58 y.o. female  PRE-OPERATIVE DIAGNOSIS:    Unstable Angina  PROCEDURES PERFORMED:    Left Heart Catheterization with Native Coronary  Angiography  via Right Common Femoral Artery   Left Ventriculography  PROCEDURE: The patient was brought to the [facility] in the fasting state and prepped and draped in the usual sterile fashion for Right Common Femoral artery access. 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   Sterile technique was used including antiseptics, cap, gloves, gown, hand hygiene, mask and sheet. Skin prep: Chlorhexidine.   Consent: Risks of procedure as well as the alternatives and risks of each were explained to the (patient/caregiver). Consent for procedure obtained.   Time Out: Verified patient identification, verified procedure, site/side was marked, verified correct patient position, special equipment/implants available, medications/allergies/relevent history reviewed, required imaging and test results available. Performed.  Access:   Right Common Femoral Artery: 5 Fr Sheath -   fluoroscopically guided modified Seldinger Technique   Left Heart Catheterization: 5 Fr Catheters advanced or exchanged over a standard J-wire; JL4 catheter advanced first.  Left Coronary Artery Cineangiography: JL4 Catheter  Right Coronary Artery Cineangiography: JR 4 Catheter   LV Hemodynamics (LV Gram): Angled pigtail   Sheath removed in the cath lab with manual  pressure for hemostasis.   FINDINGS:  Hemodynamics:   Central Aortic Pressure / Mean: 105/56/74 mmHg  Left Ventricular Pressure / LVEDP: 106/6/3 mmHg  Left Ventriculography:  EF: 60-65 %  Wall Motion: Hyperdynamic  Coronary Anatomy:  Dominance: Right   Left Main: Normal caliber vessel that bifurcates into the LAD and Circumflex. There is minimal stent protrusion into the distal left main but no significant lesion noted. LAD: Normal caliber vessel with widely patent ostial stent that goes to the mid vessel. Just beyond the stent there is a slight step down prior to the vessel bifurcating into major first diagonal branch (D1) and the follow-on mid LAD which continues to course a tortuous manner down around the apex. There is a Medina Class 1 , 1, 1 bifurcation lesion of the LAD and D1 that is at the most 50% in the LAD and 30% in the ostial D1.   The remainder of the LAD is free of significant disease, just tortuous.  D1: Moderate to large caliber major branch with several small branches. It is tortuous but free of significant disease the multiple distal branches.  Left Circumflex: Moderate to large caliber nondominant vessel which essentially courses as a lateral obtuse marginal with a very small AV groove branch. There is an ostial roughly 30% stenosis from the stent extending into the LM from LAD, but not flow-limiting The vessel does not reach the distal inferoseptal and posterolateral wall. Minimal luminal irregularities   RCA: Large-caliber, dominant vessel that bifurcates distally into the Right Posterior Descending Artery (RPDA) and the Right Posterior AV Groove Branch (RPAV).  RPDA: Moderate caliber vessel that is tortuous and reaches two thirds the way to the apex. Minimal luminal irregularities  RPL Sysytem:The RPAV begins as a moderate caliber vessel that  terminates as a major posterolateral branch. Minimal luminal irregularities  MEDICATIONS:  Anesthesia:  Local  Lidocaine 14 ml  Sedation:  3 mg IV Versed, 75 mcg IV fentanyl  Omnipaque Contrast: 80 ml  PATIENT DISPOSITION:    The patient was transferred to the PACU holding area in a hemodynamicaly stable, chest pain free condition.  The patient tolerated the procedure well, and there were no complications.  EBL:   < 5 ml  The patient was stable before, during, and after the procedure.  POST-OPERATIVE DIAGNOSIS:    Widely patent Ostial LAD stent with stable moderate mid LAD ~40-50% lesion  Normal LVEF with normal LVEDP.  PLAN OF CARE:  Standard post femoral cath care.  Anticipate d/c later today  Recommend non-invasive Myoview to evaluate for ischemia if Symptoms progress.  The mid LAD does not appear flow limiting & remains stable.   Leonie Man, M.D., M.S. [facility] [address] [place], [place]  [address]  [phone]  12/09/2013 11:55 AM

## 2013-12-09 NOTE — Discharge Instructions (Signed)
Chest Pain (Nonspecific) °It is often hard to give a specific diagnosis for the cause of chest pain. There is always a chance that your pain could be related to something serious, such as a heart attack or a blood clot in the lungs. You need to follow up with your health care provider for further evaluation. °CAUSES  °· Heartburn. °· Pneumonia or bronchitis. °· Anxiety or stress. °· Inflammation around your heart (pericarditis) or lung (pleuritis or pleurisy). °· A blood clot in the lung. °· A collapsed lung (pneumothorax). It can develop suddenly on its own (spontaneous pneumothorax) or from trauma to the chest. °· Shingles infection (herpes zoster virus). °The chest wall is composed of bones, muscles, and cartilage. Any of these can be the source of the pain. °· The bones can be bruised by injury. °· The muscles or cartilage can be strained by coughing or overwork. °· The cartilage can be affected by inflammation and become sore (costochondritis). °DIAGNOSIS  °Lab tests or other studies may be needed to find the cause of your pain. Your health care provider may have you take a test called an ambulatory electrocardiogram (ECG). An ECG records your heartbeat patterns over a 24-hour period. You may also have other tests, such as: °· Transthoracic echocardiogram (TTE). During echocardiography, sound waves are used to evaluate how blood flows through your heart. °· Transesophageal echocardiogram (TEE). °· Cardiac monitoring. This allows your health care provider to monitor your heart rate and rhythm in real time. °· Holter monitor. This is a portable device that records your heartbeat and can help diagnose heart arrhythmias. It allows your health care provider to track your heart activity for several days, if needed. °· Stress tests by exercise or by giving medicine that makes the heart beat faster. °TREATMENT  °· Treatment depends on what may be causing your chest pain. Treatment may include: °¨ Acid blockers for  heartburn. °¨ Anti-inflammatory medicine. °¨ Pain medicine for inflammatory conditions. °¨ Antibiotics if an infection is present. °· You may be advised to change lifestyle habits. This includes stopping smoking and avoiding alcohol, caffeine, and chocolate. °· You may be advised to keep your head raised (elevated) when sleeping. This reduces the chance of acid going backward from your stomach into your esophagus. °Most of the time, nonspecific chest pain will improve within 2-3 days with rest and mild pain medicine.  °HOME CARE INSTRUCTIONS  °· If antibiotics were prescribed, take them as directed. Finish them even if you start to feel better. °· For the next few days, avoid physical activities that bring on chest pain. Continue physical activities as directed. °· Do not use any tobacco products, including cigarettes, chewing tobacco, or electronic cigarettes. °· Avoid drinking alcohol. °· Only take medicine as directed by your health care provider. °· Follow your health care provider's suggestions for further testing if your chest pain does not go away. °· Keep any follow-up appointments you made. If you do not go to an appointment, you could develop lasting (chronic) problems with pain. If there is any problem keeping an appointment, call to reschedule. °SEEK MEDICAL CARE IF:  °· Your chest pain does not go away, even after treatment. °· You have a rash with blisters on your chest. °· You have a fever. °SEEK IMMEDIATE MEDICAL CARE IF:  °· You have increased chest pain or pain that spreads to your arm, neck, jaw, back, or abdomen. °· You have shortness of breath. °· You have an increasing cough, or you cough   up blood.  You have severe back or abdominal pain.  You feel nauseous or vomit.  You have severe weakness.  You faint.  You have chills. This is an emergency. Do not wait to see if the pain will go away. Get medical help at once. Call your local emergency services (911 in [place] Do not drive  yourself to the hospital. MAKE SURE YOU:   Understand these instructions.  Will watch your condition.  Will get help right away if you are not doing well or get worse. Document Released: 01/30/2005 Document Revised: 04/27/2013 Document Reviewed: 11/26/2007 [facility] Patient Information 2015 [place], [place]. This information is not intended to replace advice given to you by your health care provider. Make sure you discuss any questions you have with your health care provider.  No driving for 24 hours. No lifting over 5 lbs for 1 week. No sexual activity for 1 week. You can return to work tomorrow. Keep procedure site clean & dry. If you notice increased pain, swelling, bleeding or pus, call/return!  You may shower, but no soaking baths/hot tubs/pools for 1 week.

## 2013-12-09 NOTE — Progress Notes (Signed)
Patient Name: Angelica [name] Date of Encounter: 12/09/2013     Principal Problem:   Shoulder pain, right  - originally thought to be anginal equivalent given similarity to previous angina, however no change to LAD stenosis on cath  Active Problems:   CAD S/P percutaneous coronary angioplasty: Ostial LAD Promus DES 3.0 mm x 12 mm   Hyperlipidemia with target LDL less than 70   Peripheral vascular disease   Atherosclerotic heart disease of native coronary artery with unstable angina pectoris    SUBJECTIVE   No CP or SOB. Still have a little R posterior shoulder pain, however no radiation like last time. No burping like last time either. States she has been under a lot of stress recently working >100hr per week  CURRENT MEDS . [START ON 12/10/2013] aspirin EC  81 mg Oral QPM  . atorvastatin  40 mg Oral q1800  . metoprolol tartrate  12.5 mg Oral BID  . sodium chloride  3 mL Intravenous Q12H    OBJECTIVE  Filed Vitals:   12/09/13 1215 12/09/13 1230 12/09/13 1245 12/09/13 1258  BP: 111/69 112/68 99/80 114/74  Pulse: 80 58 53 62  Temp:      TempSrc:      Resp:      Height:      Weight:      SpO2: 100% 100% 99% 100%    Intake/Output Summary (Last 24 hours) at 12/09/13 1412 Last data filed at 12/09/13 1300  Gross per 24 hour  Intake    237 ml  Output      0 ml  Net    237 ml   Filed Weights   12/08/13 2130 12/09/13 0535  Weight: 134 lb (60.782 kg) 134 lb 12.8 oz (61.145 kg)    PHYSICAL EXAM  General: Pleasant, NAD. Neuro: Alert and oriented X 3. Moves all extremities spontaneously. Psych: Normal affect. HEENT:  Normal  Neck: Supple without bruits or JVD. Lungs:  Resp regular and unlabored, CTA. Heart: RRR no s3, s4, or murmurs. Abdomen: Soft, non-tender, non-distended, BS + x 4. R groin site stable with no bleeding or hematoma.  Extremities: No clubbing, cyanosis or edema. DP/PT/Radials 2+ and equal bilaterally.  Accessory Clinical Findings  CBC  Recent  Labs  12/09/13 0323  WBC 8.8  HGB 13.4  HCT 40.6  MCV 100.7*  PLT 202   Basic Metabolic Panel  Recent Labs  12/08/13 2245 12/09/13 0323  NA 141 140  K 4.1 4.4  CL 101 106  CO2 27 23  GLUCOSE 87 87  BUN 17 19  CREATININE 0.81 0.70  CALCIUM 9.7 8.4   Liver Function Tests  Recent Labs  12/08/13 2245  AST 22  ALT 22  ALKPHOS 53  BILITOT 0.7  PROT 8.4*  ALBUMIN 4.6   Cardiac Enzymes  Recent Labs  12/08/13 2245 12/09/13 0323 12/09/13 0900  TROPONINI <0.30 <0.30 <0.30   Fasting Lipid Panel  Recent Labs  12/09/13 0323  CHOL 124  HDL 86  LDLCALC 30  TRIG 40  CHOLHDL 1.4    TELE  NSR with HR 60s, no significant ventricular ectopy  ECG  NSR with HR 60s, no significant ST-T wave changes  Radiology/Studies  No results found.  ASSESSMENT AND PLAN  1. 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 CAD  - stent in 2012  - cath May 2014 revealed 50% mid-LAD lesion and patent prox LAD stent  3. Hyperlipidemia  Signed, Almyra Deforest PA-C Pager: [phone] Doing well post cath. She thinks that in view of cath findings that her shoulder pain may be musculoskeletal from sitting in front of a computer 80 hours a week. Anticipate home later today after period of bedrest is over.

## 2013-12-09 NOTE — Progress Notes (Signed)
UR completed 

## 2013-12-09 NOTE — Progress Notes (Signed)
Reviewed discharge instructions with patient and husband and they stated their understanding.  Prescription called into [facility] on [address] per patient request.  Ambulated in hallway multiple times and right groin remains level 0.  Discharged home via wheelchair with husband.  Angelica [name]

## 2013-12-09 NOTE — Progress Notes (Signed)
ANTICOAGULATION CONSULT NOTE - Follow Up Consult  Pharmacy Consult for Heparin  Indication: chest pain/ACS  Allergies  Allergen Reactions  . Prednisone Palpitations    Patient Measurements: Height: 5\' 5"  (165.1 cm) Weight: 134 lb (60.782 kg) IBW/kg (Calculated) : 57  Vital Signs: Temp: 98.6 F (37 C) (08/05 2130) BP: 140/92 mmHg (08/05 2130) Pulse Rate: 69 (08/05 2130)  Labs:  Recent Labs  12/08/13 2245 12/09/13 0323  HGB  --  13.4  HCT  --  40.6  PLT  --  256  LABPROT  --  14.4  INR  --  1.12  HEPARINUNFRC  --  0.74*  CREATININE 0.81 0.70  TROPONINI <0.30  --    Estimated Creatinine Clearance: 69.8 ml/min (by C-G formula based on Cr of 0.7).  Medications:  Heparin 850 units/hr  Assessment: 58 y/o F on heparin for CP. First HL is high at 0.74. Other labs as above. No issues per RN.   Goal of Therapy:  Heparin level 0.3-0.7 units/ml Monitor platelets by anticoagulation protocol: Yes   Plan:  -Decrease heparin to 750 units/hr -1030 HL -Daily CBC/HL -Monitor for bleeding  Narda Bonds 12/09/2013,4:08 AM

## 2013-12-14 ENCOUNTER — Other Ambulatory Visit: Payer: Self-pay | Admitting: Cardiology

## 2013-12-15 ENCOUNTER — Other Ambulatory Visit: Payer: Self-pay

## 2014-02-10 ENCOUNTER — Ambulatory Visit: Payer: 59 | Admitting: Cardiology

## 2014-02-21 ENCOUNTER — Encounter: Payer: Self-pay | Admitting: Gynecology

## 2014-02-21 ENCOUNTER — Ambulatory Visit (INDEPENDENT_AMBULATORY_CARE_PROVIDER_SITE_OTHER): Payer: 59 | Admitting: Gynecology

## 2014-02-21 VITALS — BP 108/78 | HR 84 | Resp 12 | Ht 63.0 in | Wt 137.0 lb

## 2014-02-21 DIAGNOSIS — Z0000 Encounter for general adult medical examination without abnormal findings: Secondary | ICD-10-CM

## 2014-02-21 DIAGNOSIS — Z124 Encounter for screening for malignant neoplasm of cervix: Secondary | ICD-10-CM

## 2014-02-21 DIAGNOSIS — Z01419 Encounter for gynecological examination (general) (routine) without abnormal findings: Secondary | ICD-10-CM

## 2014-02-21 LAB — POCT URINALYSIS DIPSTICK
UROBILINOGEN UA: NEGATIVE
pH, UA: 5

## 2014-02-21 NOTE — Progress Notes (Signed)
58 y.o. Married Caucasian female   G0P0 here for annual exam. Pt reports menses are not regular.  She does report hot flashes, does have night sweats, does not have vaginal dryness.  She is using lubricants, Vaseline.  She does not report post-menopasual bleeding.  Pt had episode of bleeding in 08/2101 and 09/2011, EMB 4/14-weakly proliferative endometrium-no bleeding since that time.  Patient's last menstrual period was 02/21/2013.          Sexually active: Yes.    The current method of family planning is none.    Exercising: Yes.    walking 3-4 daily Last pap: 10/2009- WNL Abnormal PAP: no Mammogram: 10/11/13 Bi-Rads 1  BSE: when she remembers  Colonoscopy: 2009- Normal f/u not sure DEXA: 2-3 years ago Alcohol:1-2 drinks/wk Tobacco: no  Labs: Stevie Kern, MD ( If labs drawn needs to go to [facility]) Urine: Blood 2, Trace Leuks  Health Maintenance  Topic Date Due  . Influenza Vaccine  12/04/2013  . Mammogram  06/05/2014  . Pap Smear  10/05/2014  . Tetanus/tdap  05/07/2015  . Colonoscopy  12/13/2017    Family History  Problem Relation Age of Onset  . Alcohol abuse Other   . Diabetes Other   . Hypertension Other   . Heart disease Other     Patient Active Problem List   Diagnosis Date Noted  . Shoulder pain, right 12/08/2013  . Atherosclerotic heart disease of native coronary artery with unstable angina pectoris 12/08/2013  . Peripheral vascular disease 05/16/2011  . Palpitations 05/06/2011  . GERD (gastroesophageal reflux disease) 05/06/2011  . Chronic epigastric pain 04/08/2011  . CAD S/P percutaneous coronary angioplasty: Ostial LAD Promus DES 3.0 mm x 12 mm 02/13/2011  . Hyperlipidemia with target LDL less than 70 02/13/2011  . BREAST CYST, LEFT 11/23/2009  . 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 ALLERGIC RHINITIS 03/02/2007    Past Medical History  Diagnosis Date  . ALLERGIC RHINITIS 03/02/2007  . BREAST CYST, LEFT 11/23/2009  . 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  CAD S/P percutaneous coronary angioplasty 01/2011    a) Exertional Back Pain & High RISK ST) - 95% Ostial LAD --> PCI with Promus DES 3.0 mm x 12 mm; b) LHC 08/2012: LAD stent Patent , 50% mid LAD; c) Neg Myoview 03/2012 d). LHC 12/09/2013 40-50% mid-LAD no progression, medical therapy. Stress test if recurrent symptom  . HLD (hyperlipidemia)     Past Surgical History  Procedure Laterality Date  . Breast surgery      BILAT IMPLANTS  . Breast biopsy    . Percutaneous coronary stent intervention (pci-s)  01/2011    Promus Premier DES 3.0 mm x12 mm Ostial LAD (extends into LM with partial "jailing" of Cx)  . Augmentation mammaplasty Bilateral 1990    Allergies: Prednisone  Current Outpatient Prescriptions  Medication Sig Dispense Refill  . aspirin EC 81 MG tablet Take 81 mg by mouth every evening.      Marland Kitchen CRESTOR 20 MG tablet TAKE 1 TABLET EVERY NIGHT  90 tablet  2  . ibuprofen (ADVIL,MOTRIN) 200 MG tablet Take 200-400 mg by mouth every 6 (six) hours as needed for pain or headache.      . nitroGLYCERIN (NITROSTAT) 0.4 MG SL tablet Place 1 tablet (0.4 mg total) under the tongue every 5 (five) minutes as needed for chest pain.  25 tablet  12   No current facility-administered medications for this visit.    ROS: Pertinent items are noted in HPI.  Exam:    BP 108/78  Pulse 84  Resp 12  Ht 5\' 3"  (1.6 m)  Wt 137 lb (62.143 kg)  BMI 24.27 kg/m2  LMP 02/21/2013 Weight change: @WEIGHTCHANGE @ Last 3 height recordings:  Ht Readings from Last 3 Encounters:  02/21/14 5\' 3"  (1.6 m)  12/08/13 5\' 5"  (1.651 m)  12/08/13 5\' 5"  (1.651 m)   General appearance: alert, cooperative and appears stated age Head: Normocephalic, without obvious abnormality, atraumatic Neck: no adenopathy, no carotid bruit, no JVD, supple, symmetrical, trachea midline and thyroid not enlarged, symmetric, no tenderness/mass/nodules Lungs: clear to auscultation bilaterally Breasts: Inspection negative, No nipple retraction  or dimpling, No nipple discharge or bleeding, No axillary or supraclavicular adenopathy, Normal to palpation without dominant masses, implants in place, left sided contracture Heart: regular rate and rhythm, S1, S2 normal, no murmur, click, rub or gallop Abdomen: soft, non-tender; bowel sounds normal; no masses,  no organomegaly Extremities: extremities normal, atraumatic, no cyanosis or edema Skin: Skin color, texture, turgor normal. No rashes or lesions Lymph nodes: Cervical, supraclavicular, and axillary nodes normal. no inguinal nodes palpated Neurologic: Grossly normal   Pelvic: External genitalia:  no lesions              Urethra: normal appearing urethra with no masses, tenderness or lesions              Bartholins and Skenes: normal                 Vagina: atrophic              Cervix: normal appearance              Pap taken: Yes.          Bimanual Exam:  Uterus:  uterus is normal size, shape, consistency and nontender                                      Adnexa:    no masses                                      Rectovaginal: Confirms                                      Anus:  Healed fissure at 12      1. Laboratory exam ordered as part of routine general medical examination  - POCT Urinalysis Dipstick  2. Encounter for routine gynecological examination mammogram pap smear counseled on breast self exam, mammography screening, adequate intake of calcium and vitamin D, diet and exercise return annually or prn Discussed PAP guideline changes, importance of weight bearing exercises, calcium, vit D and balanced diet.  3. Screening for cervical cancer Guidelines reivewed - Pap Test with HP (IPS)  An After Visit Summary was printed and given to the patient.

## 2014-02-23 LAB — IPS PAP TEST WITH HPV

## 2014-03-13 NOTE — Progress Notes (Signed)
HPI: FU coronary disease. Previously seen for chest pain. Stress echo was markedly abnormal with nonsustained ventricular tachycardia and ischemia in the anteroseptal wall and apex. Patient underwent cardiac catheterization in September of 2012 which revealed a 95% ostial LAD lesion. The patient had PCI with a drug-eluting stent. There was some jailing of the circumflex. Repeat cath at [facility] in Dec 2012 because of palpitations and chest pain revealed patency of LAD stent and she had no other significant CAD. Had event monitor placed in Jan 2013 that showed sinus rhythm. Carotid Dopplers December 2012 showed 0-39% bilateral stenosis. 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 Myoview in November of 2013 showed an ejection fraction of 73%. There was no ischemia. Patient has continued to have chest pain. She had repeat catheterization in May of 2014 which revealed normal left main, patent LAD stent, 50% mid LAD. There was a 30-40% ostial circumflex. Ejection fraction was 55%. Again had repeat cath 8/15 that showed no change (nonobstructive CAD). Since last seen, She continues to have occasional chest and back pain. It is not clearly exertional. Not pleuritic or positional. These are similar to her previous symptoms but less severe. Mild dyspnea on exertion but no orthopnea, PND or pedal edema.  Current Outpatient Prescriptions  Medication Sig Dispense Refill  . aspirin EC 81 MG tablet Take 81 mg by mouth every evening.    Marland Kitchen CRESTOR 20 MG tablet TAKE 1 TABLET EVERY NIGHT 90 tablet 2  . ibuprofen (ADVIL,MOTRIN) 200 MG tablet Take 200-400 mg by mouth every 6 (six) hours as needed for pain or headache.    . nitroGLYCERIN (NITROSTAT) 0.4 MG SL tablet Place 1 tablet (0.4 mg total) under the tongue every 5 (five) minutes as needed for chest pain. 25 tablet 12   No current facility-administered medications for this visit.     Past Medical History    Diagnosis Date  . ALLERGIC RHINITIS 03/02/2007  . BREAST CYST, LEFT 11/23/2009  . 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 CAD S/P percutaneous coronary angioplasty 01/2011    a) Exertional Back Pain & High RISK ST) - 95% Ostial LAD --> PCI with Promus DES 3.0 mm x 12 mm; b) LHC 08/2012: LAD stent Patent , 50% mid LAD; c) Neg Myoview 03/2012 d). LHC 12/09/2013 40-50% mid-LAD no progression, medical therapy. Stress test if recurrent symptom  . HLD (hyperlipidemia)     Past Surgical History  Procedure Laterality Date  . Breast surgery      BILAT IMPLANTS  . Breast biopsy    . Percutaneous coronary stent intervention (pci-s)  01/2011    Promus Premier DES 3.0 mm x12 mm Ostial LAD (extends into LM with partial "jailing" of Cx)  . Augmentation mammaplasty Bilateral 1990    History   Social History  . Marital Status: Married    Spouse Name: N/A    Number of Children: N/A  . 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 Smoking status: Never Smoker   . Smokeless tobacco: Never Used  . Alcohol Use: 0.5 - 1.0 oz/week    1-2 drink(s) per week     Comment: Occasional beer  . Drug Use: No  . Sexual Activity:    Partners: Male    Birth Control/ Protection: Post-menopausal   Other Topics Concern  . Not on file  Social History Narrative   Occupation:    Never Smoked   Alcohol use- no   Married   Drug use- no   Regular Exercise- yes     ROS: no fevers or chills, productive cough, hemoptysis, dysphasia, odynophagia, melena, hematochezia, dysuria, hematuria, rash, seizure activity, orthopnea, PND, pedal edema, claudication. Remaining systems are negative.  Physical Exam: Well-developed well-nourished in no acute distress.  Skin is warm and dry.  HEENT is normal.  Neck is supple.  Chest is clear to auscultation with normal expansion.  Cardiovascular exam is regular rate and rhythm.  Abdominal exam nontender or  distended. No masses palpated. Extremities show no edema. neuro grossly intact  ECG Sinus rhythm with no ST changes.

## 2014-03-14 ENCOUNTER — Ambulatory Visit (INDEPENDENT_AMBULATORY_CARE_PROVIDER_SITE_OTHER): Payer: 59 | Admitting: Cardiology

## 2014-03-14 ENCOUNTER — Encounter: Payer: Self-pay | Admitting: Cardiology

## 2014-03-14 VITALS — BP 120/78 | HR 67 | Ht 62.0 in | Wt 137.9 lb

## 2014-03-14 DIAGNOSIS — Z9861 Coronary angioplasty status: Secondary | ICD-10-CM

## 2014-03-14 DIAGNOSIS — E785 Hyperlipidemia, unspecified: Secondary | ICD-10-CM

## 2014-03-14 DIAGNOSIS — I25110 Atherosclerotic heart disease of native coronary artery with unstable angina pectoris: Secondary | ICD-10-CM

## 2014-03-14 DIAGNOSIS — I2510 Atherosclerotic heart disease of native coronary artery without angina pectoris: Secondary | ICD-10-CM

## 2014-03-14 NOTE — Patient Instructions (Signed)
Your physician wants you to follow-up in: 6 MONTHS WITH DR CRENSHAW You will receive a reminder letter in the mail two months in advance. If you don't receive a letter, please call our office to schedule the follow-up appointment.  

## 2014-03-14 NOTE — Assessment & Plan Note (Signed)
Continue statin. 

## 2014-03-14 NOTE — Assessment & Plan Note (Signed)
Continue aspirin and statin. She continues to have occasional chest pain that is very difficult to evaluate. Her symptoms are atypical but they were atypical prior to her PCI. However she has had multiple caths since her LAD stent and none have revealed obstructive disease. She had a previous nuclear study showing no ischemia. I do not think further cardiac evaluation at this point is indicated.

## 2014-03-30 ENCOUNTER — Other Ambulatory Visit (INDEPENDENT_AMBULATORY_CARE_PROVIDER_SITE_OTHER): Payer: 59

## 2014-03-30 DIAGNOSIS — Z0000 Encounter for general adult medical examination without abnormal findings: Secondary | ICD-10-CM

## 2014-03-30 LAB — CBC WITH DIFFERENTIAL/PLATELET
Basophils Absolute: 0 10*3/uL (ref 0.0–0.1)
Basophils Relative: 0.3 % (ref 0.0–3.0)
EOS PCT: 6.5 % — AB (ref 0.0–5.0)
Eosinophils Absolute: 0.4 10*3/uL (ref 0.0–0.7)
HCT: 41.3 % (ref 36.0–46.0)
Hemoglobin: 13.9 g/dL (ref 12.0–15.0)
LYMPHS PCT: 27.6 % (ref 12.0–46.0)
Lymphs Abs: 1.6 10*3/uL (ref 0.7–4.0)
MCHC: 33.5 g/dL (ref 30.0–36.0)
MCV: 99.6 fl (ref 78.0–100.0)
Monocytes Absolute: 0.6 10*3/uL (ref 0.1–1.0)
Monocytes Relative: 9.7 % (ref 3.0–12.0)
Neutro Abs: 3.3 10*3/uL (ref 1.4–7.7)
Neutrophils Relative %: 55.9 % (ref 43.0–77.0)
Platelets: 282 10*3/uL (ref 150.0–400.0)
RBC: 4.15 Mil/uL (ref 3.87–5.11)
RDW: 13.4 % (ref 11.5–15.5)
WBC: 5.9 10*3/uL (ref 4.0–10.5)

## 2014-03-30 LAB — HEPATIC FUNCTION PANEL
ALK PHOS: 42 U/L (ref 39–117)
ALT: 28 U/L (ref 0–35)
AST: 27 U/L (ref 0–37)
Albumin: 4.3 g/dL (ref 3.5–5.2)
BILIRUBIN TOTAL: 0.9 mg/dL (ref 0.2–1.2)
Bilirubin, Direct: 0.1 mg/dL (ref 0.0–0.3)
TOTAL PROTEIN: 7.2 g/dL (ref 6.0–8.3)

## 2014-03-30 LAB — BASIC METABOLIC PANEL
BUN: 14 mg/dL (ref 6–23)
CALCIUM: 9.1 mg/dL (ref 8.4–10.5)
CO2: 26 mEq/L (ref 19–32)
CREATININE: 0.7 mg/dL (ref 0.4–1.2)
Chloride: 104 mEq/L (ref 96–112)
GFR: 96.11 mL/min (ref >60.00)
Glucose, Bld: 89 mg/dL (ref 70–99)
Potassium: 4.1 mEq/L (ref 3.5–5.1)
Sodium: 141 mEq/L (ref 135–145)

## 2014-03-30 LAB — POCT URINALYSIS DIPSTICK
BILIRUBIN UA: NEGATIVE
Glucose, UA: NEGATIVE
Ketones, UA: NEGATIVE
Nitrite, UA: NEGATIVE
PROTEIN UA: NEGATIVE
SPEC GRAV UA: 1.015
Urobilinogen, UA: 0.2
pH, UA: 6.5

## 2014-03-30 LAB — LIPID PANEL
Cholesterol: 145 mg/dL (ref 0–200)
HDL: 80.1 mg/dL (ref >39.00)
LDL CALC: 55 mg/dL (ref 0–99)
NonHDL: 64.9
TRIGLYCERIDES: 48 mg/dL (ref 0.0–149.0)
Total CHOL/HDL Ratio: 2
VLDL: 9.6 mg/dL (ref 0.0–40.0)

## 2014-03-30 LAB — TSH: TSH: 1.74 u[IU]/mL (ref 0.35–4.50)

## 2014-04-05 ENCOUNTER — Ambulatory Visit (INDEPENDENT_AMBULATORY_CARE_PROVIDER_SITE_OTHER): Payer: 59 | Admitting: Family Medicine

## 2014-04-05 ENCOUNTER — Encounter: Payer: Self-pay | Admitting: Family Medicine

## 2014-04-05 VITALS — BP 130/90 | Temp 98.0°F | Ht 62.75 in | Wt 139.0 lb

## 2014-04-05 DIAGNOSIS — R0789 Other chest pain: Secondary | ICD-10-CM

## 2014-04-05 DIAGNOSIS — I2583 Coronary atherosclerosis due to lipid rich plaque: Secondary | ICD-10-CM

## 2014-04-05 DIAGNOSIS — G8929 Other chronic pain: Secondary | ICD-10-CM

## 2014-04-05 DIAGNOSIS — I209 Angina pectoris, unspecified: Secondary | ICD-10-CM

## 2014-04-05 DIAGNOSIS — E785 Hyperlipidemia, unspecified: Secondary | ICD-10-CM

## 2014-04-05 DIAGNOSIS — I2510 Atherosclerotic heart disease of native coronary artery without angina pectoris: Secondary | ICD-10-CM

## 2014-04-05 MED ORDER — ROSUVASTATIN CALCIUM 20 MG PO TABS: ORAL_TABLET | ORAL | Status: DC

## 2014-04-05 MED ORDER — NITROGLYCERIN 0.4 MG SL SUBL
0.4000 mg | SUBLINGUAL_TABLET | SUBLINGUAL | Status: DC | PRN
Start: 2014-04-05 — End: 2016-11-26

## 2014-04-05 NOTE — Progress Notes (Signed)
Pre visit review using our clinic review tool, if applicable. No additional management support is needed unless otherwise documented below in the visit note. 

## 2014-04-05 NOTE — Patient Instructions (Signed)
Continue current medications  For the chest wall pain I would recommend Motrin 600 mg twice daily with food when necessary  Return in one year sooner if any problems

## 2014-04-05 NOTE — Progress Notes (Signed)
   Subjective:    Patient ID: Angelica [name], female    DOB: May 28, 1955, 58 y.o.   MRN: [id number]  HPI Angelica [name] is a 58 year old married female nonsmoker who comes in today for general physical examination  She has a history of underlying coronary artery disease and has had a stent. Since that time she's had 4 cardiac catheterizations for recurrent chest pain. Her last cardiac cath was 3 months ago which shows insignificant coronary artery disease. She continues to complain of chest pain which he describes as right upper anterior chest wall and shoulder pain that comes and goes. It may occur 0 times a day or multiple times a day. It's not associated with exertion. It's sharp pain that does not radiate. She rates the pain as a 427 on a scale of 1-10. When she has the discomfort it lasts for a few seconds and goes away.  She gets routine eye care, dental care, BSE monthly, and you mammography, colonoscopy 2009 normal.  Vaccinations up-to-date  Recent breast and pelvic exam by GYN   Review of Systems  Constitutional: Negative.   HENT: Negative.   Eyes: Negative.   Respiratory: Negative.   Cardiovascular: Negative.   Gastrointestinal: Negative.   Endocrine: Negative.   Genitourinary: Negative.   Musculoskeletal: Negative.   Skin: Negative.   Allergic/Immunologic: Negative.   Neurological: Negative.   Hematological: Negative.   Psychiatric/Behavioral: Negative.        Objective:   Physical Exam  Constitutional: She appears well-developed and well-nourished.  HENT:  Head: Normocephalic and atraumatic.  Right Ear: External ear normal.  Left Ear: External ear normal.  Nose: Nose normal.  Mouth/Throat: Oropharynx is clear and moist.  Eyes: EOM are normal. Pupils are equal, round, and reactive to light.  Neck: Normal range of motion. Neck supple. No JVD present. No tracheal deviation present. No thyromegaly present.  Cardiovascular: Normal rate, regular rhythm, normal heart  sounds and intact distal pulses.  Exam reveals no gallop and no friction rub.   No murmur heard. No carotid nor aortic bruits peripheral pulses 2+ and symmetrical  Pulmonary/Chest: Effort normal and breath sounds normal. No stridor. No respiratory distress. She has no wheezes. She has no rales. She exhibits no tenderness.  Abdominal: Soft. Bowel sounds are normal. She exhibits no distension and no mass. There is no tenderness. There is no rebound and no guarding.  Musculoskeletal: Normal range of motion.  Lymphadenopathy:    She has no cervical adenopathy.  Neurological: She is alert. She has normal reflexes. No cranial nerve deficit. She exhibits normal muscle tone. Coordination normal.  Skin: Skin is warm and dry. No rash noted. No erythema. No pallor.  Psychiatric: She has a normal mood and affect. Her behavior is normal. Judgment and thought content normal.  Nursing note and vitals reviewed.         Assessment & Plan:  Healthy female  History of coronary disease status post stent....... followed in cardiology  Chest wall pain......Marland Kitchen Motrin 400 twice a day when necessary  Hyperlipidemia,,,,,,,, continue Crestor and aspirin.Marland KitchenMarland Kitchen

## 2014-04-14 ENCOUNTER — Encounter (HOSPITAL_COMMUNITY): Payer: Self-pay | Admitting: Cardiovascular Disease

## 2014-08-19 ENCOUNTER — Other Ambulatory Visit: Payer: Self-pay | Admitting: Cardiology

## 2014-09-16 ENCOUNTER — Telehealth: Payer: Self-pay | Admitting: *Deleted

## 2014-09-16 NOTE — Telephone Encounter (Signed)
Patient will call and schedule a mammogram

## 2014-09-22 ENCOUNTER — Other Ambulatory Visit: Payer: Self-pay

## 2014-09-22 DIAGNOSIS — Z1231 Encounter for screening mammogram for malignant neoplasm of breast: Secondary | ICD-10-CM

## 2014-10-05 ENCOUNTER — Encounter: Payer: Self-pay | Admitting: Gastroenterology

## 2014-10-19 ENCOUNTER — Ambulatory Visit
Admission: RE | Admit: 2014-10-19 | Discharge: 2014-10-19 | Disposition: A | Discharge status: Home / Self Care | Payer: 59 | Source: Ambulatory Visit

## 2014-10-19 DIAGNOSIS — Z1231 Encounter for screening mammogram for malignant neoplasm of breast: Secondary | ICD-10-CM

## 2014-10-21 ENCOUNTER — Other Ambulatory Visit: Payer: Self-pay | Admitting: Family Medicine

## 2014-10-21 DIAGNOSIS — R928 Other abnormal and inconclusive findings on diagnostic imaging of breast: Secondary | ICD-10-CM

## 2014-10-26 ENCOUNTER — Ambulatory Visit
Admission: RE | Admit: 2014-10-26 | Discharge: 2014-10-26 | Disposition: A | Discharge status: Home / Self Care | Payer: 59 | Source: Ambulatory Visit | Attending: Family Medicine | Admitting: Family Medicine

## 2014-10-26 DIAGNOSIS — R928 Other abnormal and inconclusive findings on diagnostic imaging of breast: Secondary | ICD-10-CM

## 2014-11-13 IMAGING — CT CT ABD-PELV W/ CM
2 of 5 series · 17 of 46 positions shown, 19 images · IV contrast (Omnipaque 300)
Comparison: Ultrasound 08/05/2012

CLINICAL DATA: Right lower quadrant pain.  The onset of vaginal
bleeding, post menopausal

CT ABDOMEN AND PELVIS WITH CONTRAST
TECHNIQUE: Multidetector CT imaging of the abdomen and pelvis was
performed following the standard protocol during bolus
administration of intravenous contrast.
Contrast: 100mL OMNIPAQUE IOHEXOL 300 MG/ML  SOLN

[Series 2: abd/ pel 5mm · axial · 0.59mm/px · z∈[-440,-60]mm · 14 of 86 slices shown, 16 images]
[im 5/86  soft-tissue]
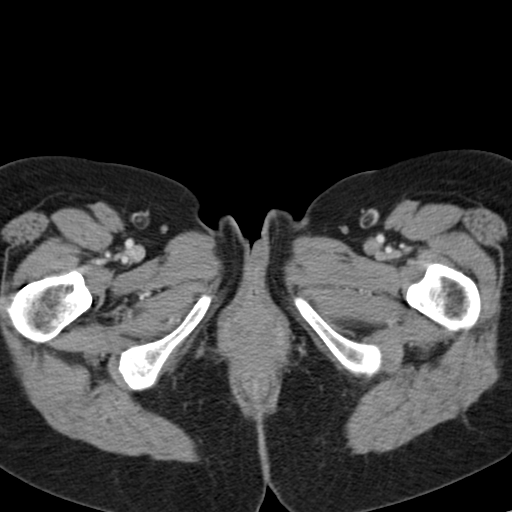
[im 5/86  bone]
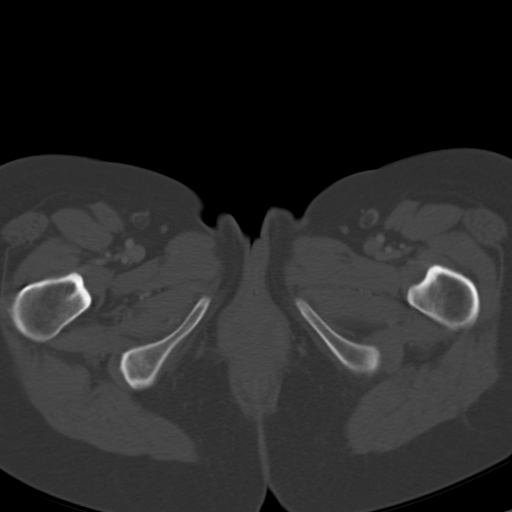
[im 13/86  soft-tissue]
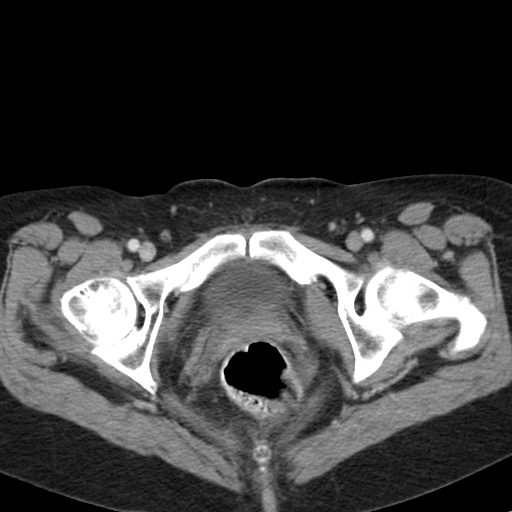
[im 17/86  soft-tissue]
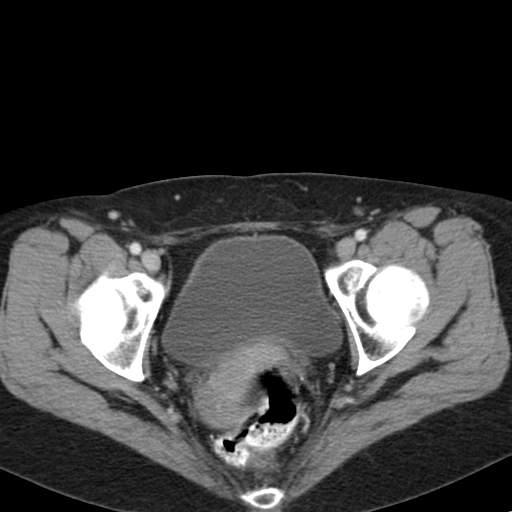
[im 25/86  soft-tissue]
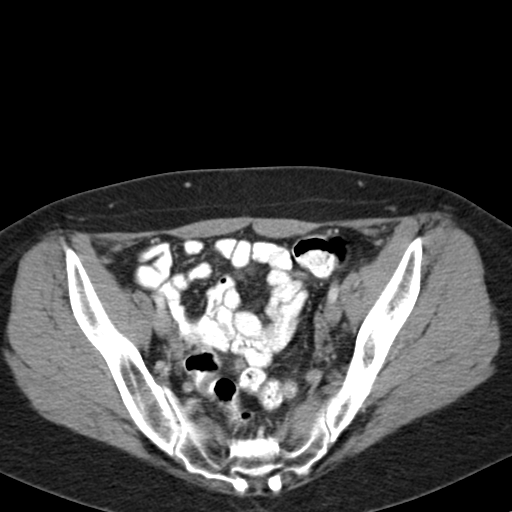
[im 29/86  soft-tissue]
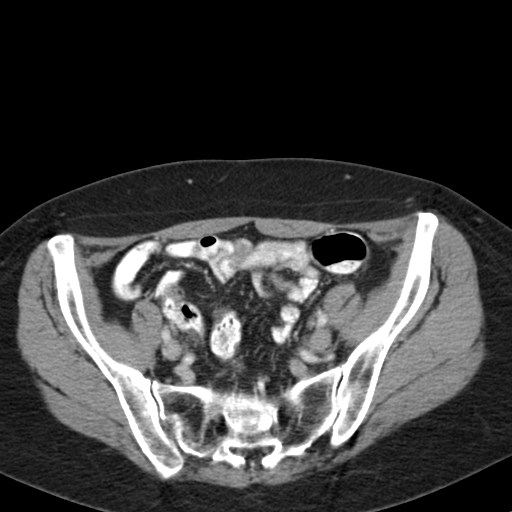
[im 33/86  soft-tissue]
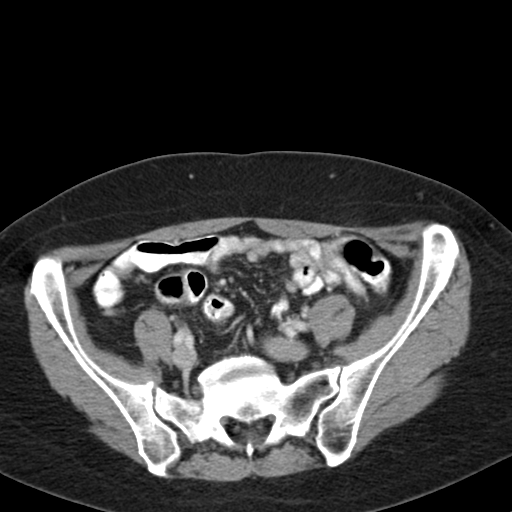
[im 41/86  soft-tissue]
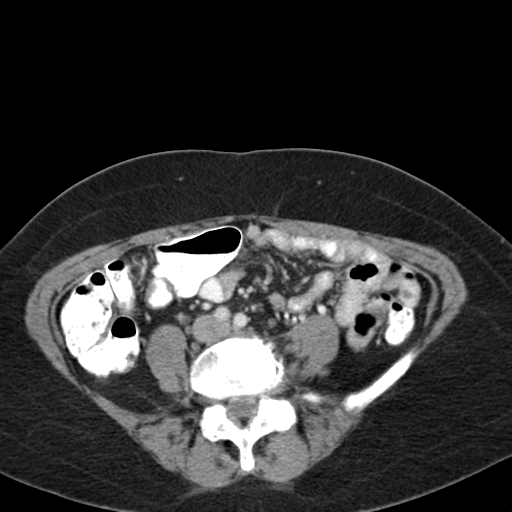
[im 45/86  soft-tissue]
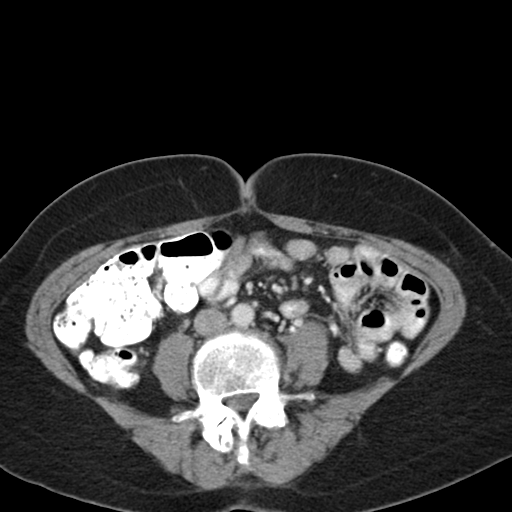
[im 53/86  soft-tissue]
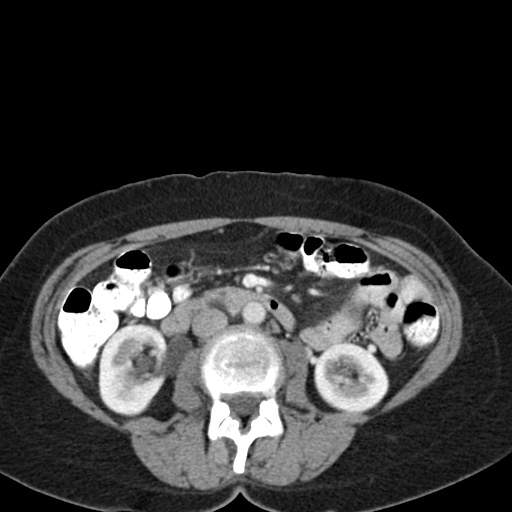
[im 53/86  bone]
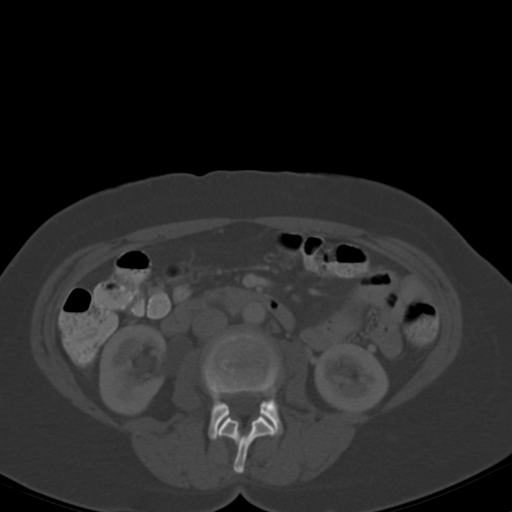
[im 57/86  soft-tissue]
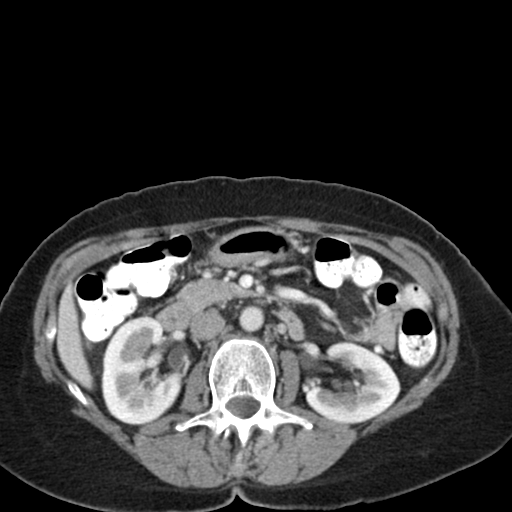
[im 65/86  soft-tissue]
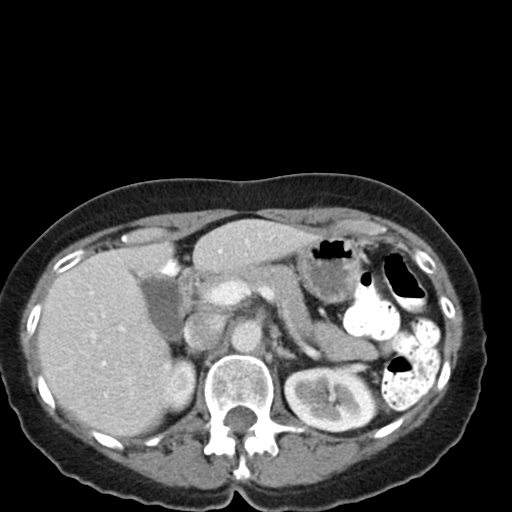
[im 69/86  soft-tissue]
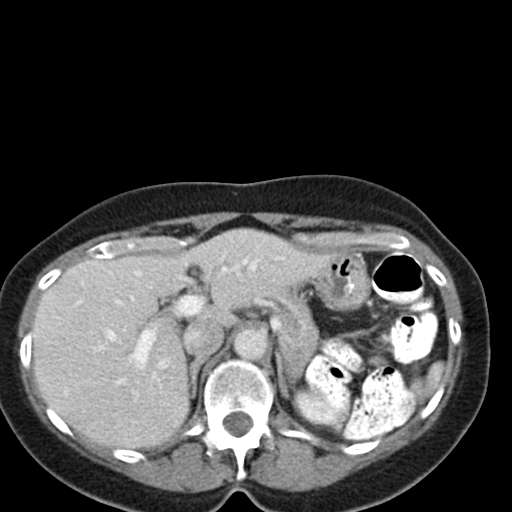
[im 73/86  soft-tissue]
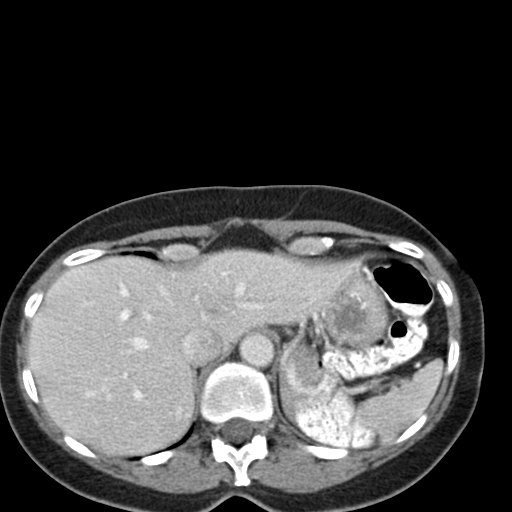
[im 81/86  soft-tissue]
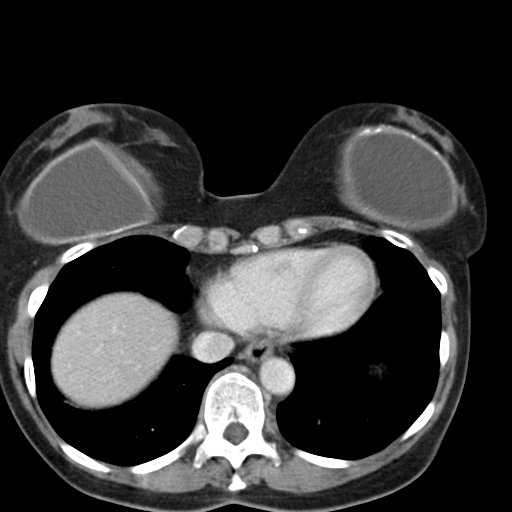

[Series 602: cor · coronal · 0.86mm/px · 3 of 89 slices shown]
[im 30/89  soft-tissue]
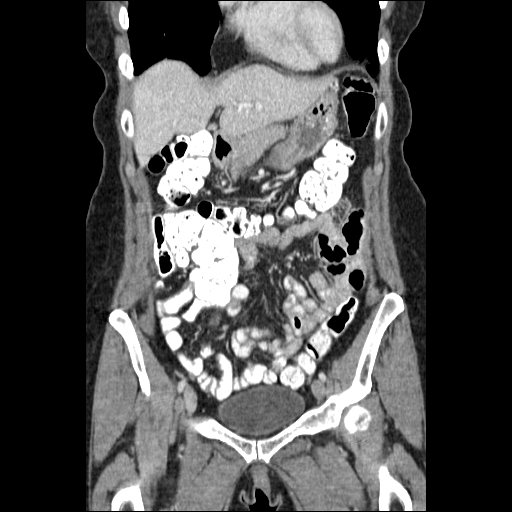
[im 40/89  soft-tissue]
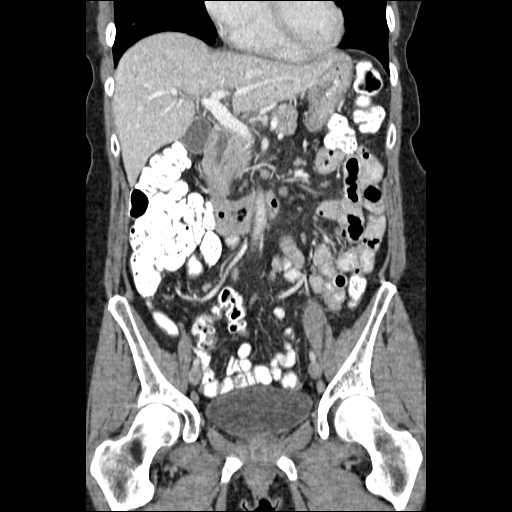
[im 49/89  soft-tissue]
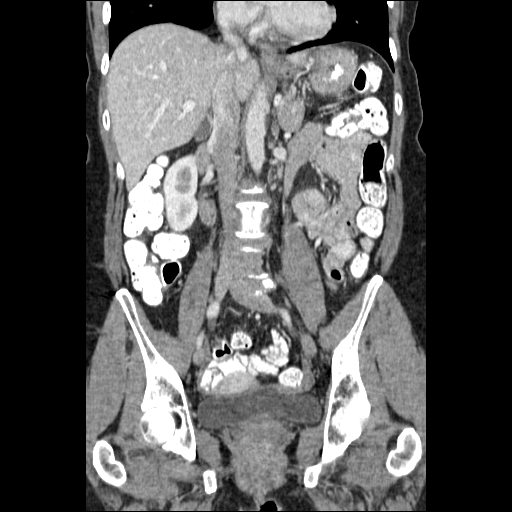

[17 of 46 positions shown; findings below may reference images not displayed]

FINDINGS: Lung bases are clear.  No pleural or pericardial fluid.

The liver has a normal appearance without focal lesions or biliary
ductal dilatation.  No calcified gallstones.  The spleen is normal.
The pancreas is normal.  The adrenal glands are normal.  The
kidneys are normal.  No cyst, mass, stone or hydronephrosis.  The
aorta and IVC are normal.  No retroperitoneal mass or adenopathy.
No free intraperitoneal fluid or air.  No visible bowel pathology.
The cecum is redundant.  The appendix is normal.  The uterus
appears unremarkable.  No adnexal lesion.  The bladder appears
normal.  No significant bony finding.  There are ordinary
degenerative changes in the lumbar spine most notable at L4-5.
IMPRESSION: No cause of the described symptoms is identified.  Normal CT scan
of the abdomen and pelvis with the exception of degenerative
disease of the lumbar spine.

## 2014-11-14 ENCOUNTER — Telehealth: Payer: Self-pay | Admitting: Cardiovascular Disease

## 2014-11-14 NOTE — Telephone Encounter (Signed)
Closed encounter °

## 2014-12-05 ENCOUNTER — Ambulatory Visit: Payer: Self-pay | Admitting: Cardiology

## 2015-01-03 ENCOUNTER — Encounter: Payer: Self-pay | Admitting: *Deleted

## 2015-01-03 ENCOUNTER — Ambulatory Visit (INDEPENDENT_AMBULATORY_CARE_PROVIDER_SITE_OTHER): Payer: 59 | Admitting: Cardiology

## 2015-01-03 ENCOUNTER — Encounter: Payer: Self-pay | Admitting: Cardiology

## 2015-01-03 DIAGNOSIS — I25110 Atherosclerotic heart disease of native coronary artery with unstable angina pectoris: Secondary | ICD-10-CM | POA: Diagnosis not present

## 2015-01-03 DIAGNOSIS — R079 Chest pain, unspecified: Secondary | ICD-10-CM | POA: Insufficient documentation

## 2015-01-03 MED ORDER — ROSUVASTATIN CALCIUM 40 MG PO TABS: ORAL_TABLET | ORAL | Status: DC

## 2015-01-03 NOTE — Progress Notes (Signed)
HPI: FU coronary disease. Previously seen for chest pain. Stress echo was markedly abnormal with nonsustained ventricular tachycardia and ischemia in the anteroseptal wall and apex. Patient underwent cardiac catheterization in September of 2012 which revealed a 95% ostial LAD lesion. The patient had PCI with a drug-eluting stent. There was some jailing of the circumflex. Repeat cath at [facility] in Dec 2012 because of palpitations and chest pain revealed patency of LAD stent and she had no other significant CAD. Had event monitor placed in Jan 2013 that showed sinus rhythm. Carotid Dopplers December 2012 showed 0-39% bilateral stenosis. 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 Myoview in November of 2013 showed an ejection fraction of 73%. There was no ischemia. Patient has continued to have chest pain. She had repeat catheterization in May of 2014 which revealed normal left main, patent LAD stent, 50% mid LAD. There was a 30-40% ostial circumflex. Ejection fraction was 55%. Again had repeat cath 8/15 that showed no change (nonobstructive CAD). Since last seen, she continues to have chest pain. This has been a chronic issue. It occurs with stress. She also notices occasional symptoms at rest and with exercise. She notes some dyspnea on exertion.  Current Outpatient Prescriptions  Medication Sig Dispense Refill  . aspirin EC 81 MG tablet Take 81 mg by mouth every evening.    Marland Kitchen ibuprofen (ADVIL,MOTRIN) 200 MG tablet Take 200-400 mg by mouth every 6 (six) hours as needed for pain or headache.    . nitroGLYCERIN (NITROSTAT) 0.4 MG SL tablet Place 1 tablet (0.4 mg total) under the tongue every 5 (five) minutes as needed for chest pain. 25 tablet 12  . rosuvastatin (CRESTOR) 20 MG tablet TAKE 1 TABLET EVERY NIGHT 90 tablet 3   No current facility-administered medications for this visit.     Past Medical History  Diagnosis Date  . ALLERGIC RHINITIS  03/02/2007  . BREAST CYST, LEFT 11/23/2009  . 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 CAD S/P percutaneous coronary angioplasty 01/2011    a) Exertional Back Pain & High RISK ST) - 95% Ostial LAD --> PCI with Promus DES 3.0 mm x 12 mm; b) LHC 08/2012: LAD stent Patent , 50% mid LAD; c) Neg Myoview 03/2012 d). LHC 12/09/2013 40-50% mid-LAD no progression, medical therapy. Stress test if recurrent symptom  . HLD (hyperlipidemia)     Past Surgical History  Procedure Laterality Date  . Breast surgery      BILAT IMPLANTS  . Breast biopsy    . Percutaneous coronary stent intervention (pci-s)  01/2011    Promus Premier DES 3.0 mm x12 mm Ostial LAD (extends into LM with partial "jailing" of Cx)  . Augmentation mammaplasty Bilateral 1990  . Left heart catheterization with coronary angiogram N/A 09/10/2012    Procedure: LEFT HEART CATHETERIZATION WITH CORONARY ANGIOGRAM;  Surgeon: Sherren Mocha, MD;  Location: [facility] CATH LAB;  Service: Cardiovascular;  Laterality: N/A;  . Left heart catheterization with coronary angiogram N/A 12/09/2013    Procedure: LEFT HEART CATHETERIZATION WITH CORONARY ANGIOGRAM;  Surgeon: Leonie Man, MD;  Location: [facility] CATH LAB;  Service: Cardiovascular;  Laterality: N/A;    Social History   Social History  . Marital Status: Married    Spouse Name: N/A  . Number of Children: N/A  . 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  Smoking status: Never Smoker   . Smokeless tobacco: Never Used  . Alcohol Use: 0.6 - 1.2 oz/week    1-2 Standard drinks or equivalent per week     Comment: Occasional beer  . Drug Use: No  . Sexual Activity:    Partners: Male    Birth Control/ Protection: Post-menopausal   Other Topics Concern  . Not on file   Social History Narrative   Occupation:    Never Smoked   Alcohol use- no   Married   Drug use- no   Regular Exercise- yes     ROS: no fevers or chills, productive  cough, hemoptysis, dysphasia, odynophagia, melena, hematochezia, dysuria, hematuria, rash, seizure activity, orthopnea, PND, pedal edema, claudication. Remaining systems are negative.  Physical Exam: Well-developed well-nourished in no acute distress.  Skin is warm and dry.  HEENT is normal.  Neck is supple.  Chest is clear to auscultation with normal expansion.  Cardiovascular exam is regular rate and rhythm.  Abdominal exam nontender or distended. No masses palpated. Extremities show no edema. neuro grossly intact  ECG sinus rhythm at a rate of 66. No ST changes.

## 2015-01-03 NOTE — Patient Instructions (Signed)
Your physician wants you to follow-up in: [place] will receive a reminder letter in the mail two months in advance. If you don't receive a letter, please call our office to schedule the follow-up appointment.   Your physician has requested that you have en exercise stress myoview. For further information please visit [email or web address]. Please follow instruction sheet, as given.   INCREASE CRESTOR TO 40 MG ONCE DAILY= 2 OF THE 20 MG TABLETS ONCE DAILY  Your physician recommends that you return for lab work in: 4 WEEKS= DO NOT EAT PRIOR TO LAB WORK

## 2015-01-03 NOTE — Assessment & Plan Note (Signed)
Given documented coronary disease increase Crestor to 40 mg daily. Check lipids and liver in 4 weeks.

## 2015-01-03 NOTE — Assessment & Plan Note (Signed)
Continue aspirin and statin. 

## 2015-01-03 NOTE — Assessment & Plan Note (Signed)
Difficult situation. She continues to have chest pain. She has had several catheterizations since her previous intervention and medical therapy recommended. We will plan a nuclear study for risk stratification. If normal plan medical therapy.

## 2015-01-05 ENCOUNTER — Telehealth (HOSPITAL_COMMUNITY): Payer: Self-pay | Admitting: Radiology

## 2015-01-05 ENCOUNTER — Telehealth: Payer: Self-pay | Admitting: Obstetrics and Gynecology

## 2015-01-05 NOTE — Telephone Encounter (Signed)
Left message regarding upcoming appointment has been canceled and needs to be rescheduled. °

## 2015-01-05 NOTE — Telephone Encounter (Signed)
Encounter complete. 

## 2015-01-06 ENCOUNTER — Telehealth (HOSPITAL_COMMUNITY): Payer: Self-pay | Admitting: Radiology

## 2015-01-06 NOTE — Telephone Encounter (Signed)
Encounter complete. 

## 2015-01-10 ENCOUNTER — Ambulatory Visit (HOSPITAL_COMMUNITY)
Admission: RE | Admit: 2015-01-10 | Discharge: 2015-01-10 | Disposition: A | Discharge status: Home / Self Care | Payer: 59 | Source: Ambulatory Visit | Attending: Cardiovascular Disease | Admitting: Cardiovascular Disease

## 2015-01-10 ENCOUNTER — Ambulatory Visit: Payer: Self-pay | Admitting: Cardiology

## 2015-01-10 DIAGNOSIS — R079 Chest pain, unspecified: Secondary | ICD-10-CM | POA: Insufficient documentation

## 2015-01-10 DIAGNOSIS — R9439 Abnormal result of other cardiovascular function study: Secondary | ICD-10-CM | POA: Insufficient documentation

## 2015-01-10 DIAGNOSIS — I25110 Atherosclerotic heart disease of native coronary artery with unstable angina pectoris: Secondary | ICD-10-CM | POA: Insufficient documentation

## 2015-01-10 DIAGNOSIS — I739 Peripheral vascular disease, unspecified: Secondary | ICD-10-CM | POA: Diagnosis not present

## 2015-01-10 DIAGNOSIS — I779 Disorder of arteries and arterioles, unspecified: Secondary | ICD-10-CM | POA: Insufficient documentation

## 2015-01-10 DIAGNOSIS — I2510 Atherosclerotic heart disease of native coronary artery without angina pectoris: Secondary | ICD-10-CM | POA: Diagnosis not present

## 2015-01-10 LAB — MYOCARDIAL PERFUSION IMAGING
CHL CUP NUCLEAR SRS: 7
CHL CUP RESTING HR STRESS: 72 {beats}/min
CSEPEDS: 41 s
CSEPPHR: 148 {beats}/min
Estimated workload: 10.4 METS
Exercise duration (min): 10 min
LV dias vol: 64 mL
LVSYSVOL: 25 mL
MPHR: 162 {beats}/min
NUC STRESS TID: 1.1
Percent HR: 91 %
RPE: 16
SDS: 0
SSS: 7

## 2015-01-10 MED ORDER — TECHNETIUM TC 99M SESTAMIBI GENERIC - CARDIOLITE
10.8000 | Freq: Once | INTRAVENOUS | Status: AC | PRN
  Administered 2015-01-10: 11 via INTRAVENOUS

## 2015-01-10 MED ORDER — TECHNETIUM TC 99M SESTAMIBI GENERIC - CARDIOLITE
30.4000 | Freq: Once | INTRAVENOUS | Status: AC | PRN
  Administered 2015-01-10: 30 via INTRAVENOUS

## 2015-01-11 ENCOUNTER — Telehealth: Payer: Self-pay | Admitting: Cardiology

## 2015-01-11 NOTE — Telephone Encounter (Signed)
Pt is calling in to see if the results to her stress test were available. She had it done on 9/6. Please f/u with her   Thanks

## 2015-01-11 NOTE — Telephone Encounter (Signed)
Pt is calling in to see if the results to her stress test were available. Gave her the results were normal as noted by Dr. Stanford Breed

## 2015-02-23 ENCOUNTER — Ambulatory Visit: Payer: 59 | Admitting: Obstetrics and Gynecology

## 2015-02-23 ENCOUNTER — Ambulatory Visit (INDEPENDENT_AMBULATORY_CARE_PROVIDER_SITE_OTHER): Payer: 59 | Admitting: Obstetrics and Gynecology

## 2015-02-23 ENCOUNTER — Encounter: Payer: Self-pay | Admitting: Obstetrics and Gynecology

## 2015-02-23 VITALS — BP 120/82 | HR 70 | Resp 14 | Ht 61.5 in | Wt 141.0 lb

## 2015-02-23 DIAGNOSIS — R319 Hematuria, unspecified: Secondary | ICD-10-CM | POA: Diagnosis not present

## 2015-02-23 DIAGNOSIS — Z01419 Encounter for gynecological examination (general) (routine) without abnormal findings: Secondary | ICD-10-CM | POA: Diagnosis not present

## 2015-02-23 DIAGNOSIS — R350 Frequency of micturition: Secondary | ICD-10-CM | POA: Diagnosis not present

## 2015-02-23 DIAGNOSIS — Z0000 Encounter for general adult medical examination without abnormal findings: Secondary | ICD-10-CM | POA: Diagnosis not present

## 2015-02-23 LAB — POCT URINALYSIS DIPSTICK
BILIRUBIN UA: NEGATIVE
GLUCOSE UA: NEGATIVE
KETONES UA: NEGATIVE
Leukocytes, UA: NEGATIVE
Nitrite, UA: NEGATIVE
Protein, UA: NEGATIVE
Urobilinogen, UA: NEGATIVE
pH, UA: 5

## 2015-02-23 NOTE — Progress Notes (Signed)
Patient ID: Angelica [name], female   DOB: [birth year], 59 y.o.   MRN: [id number] 59 y.o. Angelica [name] Married Caucasian female here for annual exam.    Has chest pains on a regular basis.  Thinks it is due to stress.  Sees Dr. Stanford Breed regularly.  Had stress test recently.   Feels tired a lot.  Working too many hours.  Corporate environment is intense.   Has urinary frequency.  PCP:  Anastasia Pall, MD  Patient's last menstrual period was 02/03/2013 (approximate).          Sexually active: Yes.  husband  The current method of family planning is post menopausal status.    Exercising: No.   Smoker:  no  Health Maintenance: Pap:  02-21-14 Neg:Neg HR HPV History of abnormal Pap:  no MMG:  10-19-14 3D Density Cat.B/Lt.breast possible distortion, warrants further evaluation, Rt.breast negative--Diag.Lt.breast and U/S--probable scarring within lower inner Lt.breast prior to implant revision;no evidence of malignancy.  Rec. Lt.Diag and [place] 6 months/[facility] 3:[facility]. Colonoscopy:  12-14-07 normal with Dr. Oretha Caprice, next due 12/2017. BMD:   5-6 yrs ago?  Result:  Normal per patient:Angelica [name]? TDaP:  2012 Screening Labs:  Hb today: PCP, Urine today: Trace RBCs   reports that she has never smoked. She has never used smokeless tobacco. She reports that she drinks about 0.6 - 1.2 oz of alcohol per week. She reports that she does not use illicit drugs.  Past Medical History  Diagnosis Date  . ALLERGIC RHINITIS 03/02/2007  . BREAST CYST, LEFT 11/23/2009  . 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 CAD S/P percutaneous coronary angioplasty 01/2011    a) Exertional Back Pain & High RISK ST) - 95% Ostial LAD --> PCI with Promus DES 3.0 mm x 12 mm; b) LHC 08/2012: LAD stent Patent , 50% mid LAD; c) Neg Myoview 03/2012 d). LHC 12/09/2013 40-50% mid-LAD no progression, medical therapy. Stress test if recurrent symptom  . HLD (hyperlipidemia)     Past Surgical History  Procedure Laterality Date  .  Breast surgery      BILAT IMPLANTS  . Breast biopsy    . Percutaneous coronary stent intervention (pci-s)  01/2011    Promus Premier DES 3.0 mm x12 mm Ostial LAD (extends into LM with partial "jailing" of Cx)  . Augmentation mammaplasty Bilateral 1990  . Left heart catheterization with coronary angiogram N/A 09/10/2012    Procedure: LEFT HEART CATHETERIZATION WITH CORONARY ANGIOGRAM;  Surgeon: Sherren Mocha, MD;  Location: [facility] CATH LAB;  Service: Cardiovascular;  Laterality: N/A;  . Left heart catheterization with coronary angiogram N/A 12/09/2013    Procedure: LEFT HEART CATHETERIZATION WITH CORONARY ANGIOGRAM;  Surgeon: Leonie Man, MD;  Location: [facility] CATH LAB;  Service: Cardiovascular;  Laterality: N/A;    Current Outpatient Prescriptions  Medication Sig Dispense Refill  . aspirin EC 81 MG tablet Take 81 mg by mouth every evening.    Marland Kitchen ibuprofen (ADVIL,MOTRIN) 200 MG tablet Take 200-400 mg by mouth every 6 (six) hours as needed for pain or headache.    . nitroGLYCERIN (NITROSTAT) 0.4 MG SL tablet Place 1 tablet (0.4 mg total) under the tongue every 5 (five) minutes as needed for chest pain. 25 tablet 12  . rosuvastatin (CRESTOR) 40 MG tablet TAKE 1 TABLET EVERY NIGHT 90 tablet 3   No current facility-administered medications for this visit.    Family History  Problem Relation Age of Onset  . Alcohol abuse Other   . Diabetes Other   .  Hypertension Other   . Heart disease Other     ROS:  Pertinent items are noted in HPI.  Otherwise, a comprehensive ROS was negative.  Exam:   BP 120/82 mmHg  Pulse 70  Resp 14  Ht 5' 1.5" (1.562 m)  Wt 141 lb (63.957 kg)  BMI 26.21 kg/m2  LMP 02/03/2013 (Approximate)    General appearance: alert, cooperative and appears stated age Head: Normocephalic, without obvious abnormality, atraumatic Neck: no adenopathy, supple, symmetrical, trachea midline and thyroid normal to inspection and palpation Lungs: clear to auscultation bilaterally Breasts:  normal appearance, no masses or tenderness, Inspection negative, No nipple retraction or dimpling, No nipple discharge or bleeding, No axillary or supraclavicular adenopathy, bilateral implants. Heart: regular rate and rhythm Abdomen: soft, non-tender; bowel sounds normal; no masses,  no organomegaly Extremities: extremities normal, atraumatic, no cyanosis or edema Skin: Skin color, texture, turgor normal. No rashes or lesions Lymph nodes: Cervical, supraclavicular, and axillary nodes normal. No abnormal inguinal nodes palpated Neurologic: Grossly normal  Pelvic: External genitalia:  no lesions              Urethra:  normal appearing urethra with no masses, tenderness or lesions              Bartholins and Skenes: normal                 Vagina: normal appearing vagina with normal color and discharge, no lesions              Cervix: no lesions              Pap taken: No. Bimanual Exam:  Uterus:  normal size, contour, position, consistency, mobility, non-tender              Adnexa: normal adnexa and no mass, fullness, tenderness              Rectovaginal: Yes.  .  Confirms.              Anus:  normal sphincter tone, no lesions  Chaperone was present for exam.  Assessment:   Well woman visit with normal exam. Bilateral breast implants. Left breast distortion on mammogram. Microscopic hematuria.  Urinary frequency.   Plan: Yearly mammogram recommended after age 20.   Will do diagnostic left mammogram and ultrasound in December 2016.  Patient will call to schedule. Recommended self breast exam.  Pap and HR HPV as above. Discussed Calcium, Vitamin D, regular exercise program including cardiovascular and weight bearing exercise. Labs performed.  Yes.  Urine micro and culture.  Refills given on medications.  No..    I encouraged patient to take care of herself.  Follow up annually and prn.      After visit summary provided.

## 2015-02-23 NOTE — Patient Instructions (Signed)

## 2015-02-24 ENCOUNTER — Other Ambulatory Visit: Payer: Self-pay | Admitting: Family Medicine

## 2015-02-24 DIAGNOSIS — N6489 Other specified disorders of breast: Secondary | ICD-10-CM

## 2015-02-24 DIAGNOSIS — R928 Other abnormal and inconclusive findings on diagnostic imaging of breast: Secondary | ICD-10-CM

## 2015-02-24 LAB — URINALYSIS, MICROSCOPIC ONLY
BACTERIA UA: NONE SEEN [HPF]
CASTS: NONE SEEN [LPF]
CRYSTALS: NONE SEEN [HPF]
RBC / HPF: NONE SEEN RBC/HPF (ref <=2)
Squamous Epithelial / LPF: NONE SEEN [HPF] (ref <=5)
WBC UA: NONE SEEN WBC/HPF (ref <=5)
Yeast: NONE SEEN [HPF]

## 2015-02-24 LAB — URINE CULTURE
Colony Count: NO GROWTH
Organism ID, Bacteria: NO GROWTH

## 2015-02-27 ENCOUNTER — Telehealth: Payer: Self-pay

## 2015-02-27 NOTE — Telephone Encounter (Signed)
Left message to call Kaitlyn at [phone]. 

## 2015-02-27 NOTE — Telephone Encounter (Signed)
-----   Message from Angelica Dom, MD sent at 02/27/2015  2:26 PM EDT ----- Please advise the patient of normal results.

## 2015-03-02 NOTE — Telephone Encounter (Signed)
Spoke with patient. Results given. Patient is agreeable and verbalizes understanding.  Routing to provider for final review. Patient agreeable to disposition. Will close encounter.     

## 2015-03-21 ENCOUNTER — Encounter: Payer: Self-pay | Admitting: *Deleted

## 2015-04-28 ENCOUNTER — Ambulatory Visit
Admission: RE | Admit: 2015-04-28 | Discharge: 2015-04-28 | Disposition: A | Discharge status: Home / Self Care | Payer: 59 | Source: Ambulatory Visit | Attending: Family Medicine | Admitting: Family Medicine

## 2015-04-28 ENCOUNTER — Other Ambulatory Visit: Payer: Self-pay | Admitting: Family Medicine

## 2015-04-28 ENCOUNTER — Other Ambulatory Visit: Payer: 59

## 2015-04-28 DIAGNOSIS — R928 Other abnormal and inconclusive findings on diagnostic imaging of breast: Secondary | ICD-10-CM

## 2015-07-14 NOTE — Progress Notes (Signed)
HPI: FU coronary disease. Cardiac catheterization in September of 2012 revealed a 95% ostial LAD lesion. The patient had PCI with a drug-eluting stent. There was some jailing of the circumflex. Repeat cath at [facility] in Dec 2012 because of palpitations and chest pain revealed patency of LAD stent and she had no other significant CAD. Had event monitor placed in Jan 2013 that showed sinus rhythm. Carotid Dopplers December 2012 showed 0-39% bilateral stenosis. 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 Patient has continued to have chest pain. She had repeat catheterization in May of 2014 which revealed normal left main, patent LAD stent, 50% mid LAD. There was a 30-40% ostial circumflex. Ejection fraction was 55%. Again had repeat cath 8/15 that showed no change (nonobstructive CAD). Nuclear study in September 2016 showed ejection fraction 61%. There was inferior thinning but no ischemia. Since last seen,   Current Outpatient Prescriptions  Medication Sig Dispense Refill  . aspirin EC 81 MG tablet Take 81 mg by mouth every evening.    Marland Kitchen ibuprofen (ADVIL,MOTRIN) 200 MG tablet Take 200-400 mg by mouth every 6 (six) hours as needed for pain or headache.    . nitroGLYCERIN (NITROSTAT) 0.4 MG SL tablet Place 1 tablet (0.4 mg total) under the tongue every 5 (five) minutes as needed for chest pain. 25 tablet 12  . rosuvastatin (CRESTOR) 40 MG tablet TAKE 1 TABLET EVERY NIGHT 90 tablet 3   No current facility-administered medications for this visit.     Past Medical History  Diagnosis Date  . ALLERGIC RHINITIS 03/02/2007  . BREAST CYST, LEFT 11/23/2009  . 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 CAD S/P percutaneous coronary angioplasty 01/2011    a) Exertional Back Pain & High RISK ST) - 95% Ostial LAD --> PCI with Promus DES 3.0 mm x 12 mm; b) LHC 08/2012: LAD stent Patent , 50% mid LAD; c) Neg Myoview 03/2012 d). LHC 12/09/2013 40-50% mid-LAD no  progression, medical therapy. Stress test if recurrent symptom  . HLD (hyperlipidemia)     Past Surgical History  Procedure Laterality Date  . Breast surgery      BILAT IMPLANTS  . Breast biopsy    . Percutaneous coronary stent intervention (pci-s)  01/2011    Promus Premier DES 3.0 mm x12 mm Ostial LAD (extends into LM with partial "jailing" of Cx)  . Augmentation mammaplasty Bilateral 1990  . Left heart catheterization with coronary angiogram N/A 09/10/2012    Procedure: LEFT HEART CATHETERIZATION WITH CORONARY ANGIOGRAM;  Surgeon: Sherren Mocha, MD;  Location: [facility] CATH LAB;  Service: Cardiovascular;  Laterality: N/A;  . Left heart catheterization with coronary angiogram N/A 12/09/2013    Procedure: LEFT HEART CATHETERIZATION WITH CORONARY ANGIOGRAM;  Surgeon: Leonie Man, MD;  Location: [facility] CATH LAB;  Service: Cardiovascular;  Laterality: N/A;    Social History   Social History  . Marital Status: Married    Spouse Name: N/A  . Number of Children: N/A  . 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 Smoking status: Never Smoker   . Smokeless tobacco: Never Used  . Alcohol Use: 0.6 - 1.2 oz/week    1-2 Standard drinks or equivalent per week     Comment: Occasional beer  . Drug Use: No  . Sexual Activity:    Partners: Male    Birth  Control/ Protection: Post-menopausal   Other Topics Concern  . Not on file   Social History Narrative   Occupation:    Never Smoked   Alcohol use- no   Married   Drug use- no   Regular Exercise- yes     Family History  Problem Relation Age of Onset  . Alcohol abuse Other   . Diabetes Other   . Hypertension Other   . Heart disease Other     ROS: no fevers or chills, productive cough, hemoptysis, dysphasia, odynophagia, melena, hematochezia, dysuria, hematuria, rash, seizure activity, orthopnea, PND, pedal edema, claudication. Remaining systems are negative.  Physical  Exam: Well-developed well-nourished in no acute distress.  Skin is warm and dry.  HEENT is normal.  Neck is supple.  Chest is clear to auscultation with normal expansion.  Cardiovascular exam is regular rate and rhythm.  Abdominal exam nontender or distended. No masses palpated. Extremities show no edema. neuro grossly intact  ECG     This encounter was created in error - please disregard.

## 2015-07-20 ENCOUNTER — Encounter: Payer: 59 | Admitting: Cardiology

## 2015-07-28 NOTE — Progress Notes (Signed)
HPI: FU coronary disease. Previously seen for chest pain. Stress echo was markedly abnormal with nonsustained ventricular tachycardia and ischemia in the anteroseptal wall and apex. Patient underwent cardiac catheterization in September of 2012 which revealed a 95% ostial LAD lesion. The patient had PCI with a drug-eluting stent. There was some jailing of the circumflex. Repeat cath at [facility] in Dec 2012 because of palpitations and chest pain revealed patency of LAD stent and she had no other significant CAD. Had event monitor placed in Jan 2013 that showed sinus rhythm. Carotid Dopplers December 2012 showed 0-39% bilateral stenosis. 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 Myoview in November of 2013 showed an ejection fraction of 73%. There was no ischemia. Patient has continued to have chest pain. She had repeat catheterization in May of 2014 which revealed normal left main, patent LAD stent, 50% mid LAD. There was a 30-40% ostial circumflex. Ejection fraction was 55%. Again had repeat cath 8/15 that showed no change (nonobstructive CAD). Nuclear study September 2016 showed ejection fraction 61%. There was inferior septal thinning but no ischemia. Since last seen, Patient denies dyspnea on exertion, orthopnea, PND, palpitations or syncope. Occasional mild pedal edema. She continues to have shoulder and right upper chest pain with stress unchanged.  Current Outpatient Prescriptions  Medication Sig Dispense Refill  . aspirin EC 81 MG tablet Take 81 mg by mouth every evening.    Marland Kitchen ibuprofen (ADVIL,MOTRIN) 200 MG tablet Take 200-400 mg by mouth every 6 (six) hours as needed for pain or headache.    . nitroGLYCERIN (NITROSTAT) 0.4 MG SL tablet Place 1 tablet (0.4 mg total) under the tongue every 5 (five) minutes as needed for chest pain. 25 tablet 12  . rosuvastatin (CRESTOR) 40 MG tablet TAKE 1 TABLET EVERY NIGHT 90 tablet 3   No current  facility-administered medications for this visit.     Past Medical History  Diagnosis Date  . ALLERGIC RHINITIS 03/02/2007  . BREAST CYST, LEFT 11/23/2009  . 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 CAD S/P percutaneous coronary angioplasty 01/2011    a) Exertional Back Pain & High RISK ST) - 95% Ostial LAD --> PCI with Promus DES 3.0 mm x 12 mm; b) LHC 08/2012: LAD stent Patent , 50% mid LAD; c) Neg Myoview 03/2012 d). LHC 12/09/2013 40-50% mid-LAD no progression, medical therapy. Stress test if recurrent symptom  . HLD (hyperlipidemia)     Past Surgical History  Procedure Laterality Date  . Breast surgery      BILAT IMPLANTS  . Breast biopsy    . Percutaneous coronary stent intervention (pci-s)  01/2011    Promus Premier DES 3.0 mm x12 mm Ostial LAD (extends into LM with partial "jailing" of Cx)  . Augmentation mammaplasty Bilateral 1990  . Left heart catheterization with coronary angiogram N/A 09/10/2012    Procedure: LEFT HEART CATHETERIZATION WITH CORONARY ANGIOGRAM;  Surgeon: Sherren Mocha, MD;  Location: [facility] CATH LAB;  Service: Cardiovascular;  Laterality: N/A;  . Left heart catheterization with coronary angiogram N/A 12/09/2013    Procedure: LEFT HEART CATHETERIZATION WITH CORONARY ANGIOGRAM;  Surgeon: Leonie Man, MD;  Location: [facility] CATH LAB;  Service: Cardiovascular;  Laterality: N/A;    Social History   Social History  . Marital Status: Married    Spouse Name: N/A  . Number of Children: N/A  . 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 Smoking status: Never Smoker   . Smokeless tobacco: Never Used  . Alcohol Use: 0.6 - 1.2 oz/week    1-2 Standard drinks or equivalent per week     Comment: Occasional beer  . Drug Use: No  . Sexual Activity:    Partners: Male    Birth Control/ Protection: Post-menopausal   Other Topics Concern  . Not on file   Social History Narrative   Occupation:    Never Smoked    Alcohol use- no   Married   Drug use- no   Regular Exercise- yes     Family History  Problem Relation Age of Onset  . Alcohol abuse Other   . Diabetes Other   . Hypertension Other   . Heart disease Other     ROS: no fevers or chills, productive cough, hemoptysis, dysphasia, odynophagia, melena, hematochezia, dysuria, hematuria, rash, seizure activity, orthopnea, PND, pedal edema, claudication. Remaining systems are negative.  Physical Exam: Well-developed well-nourished in no acute distress.  Skin is warm and dry.  HEENT is normal.  Neck is supple.  Chest is clear to auscultation with normal expansion.  Cardiovascular exam is regular rate and rhythm.  Abdominal exam nontender or distended. No masses palpated. Extremities show no edema. neuro grossly intact  ECG Sinus rhythm at a rate of 80. Anterior infarct.

## 2015-08-03 ENCOUNTER — Encounter: Payer: Self-pay | Admitting: Cardiology

## 2015-08-03 ENCOUNTER — Ambulatory Visit (INDEPENDENT_AMBULATORY_CARE_PROVIDER_SITE_OTHER): Payer: 59 | Admitting: Cardiology

## 2015-08-03 VITALS — BP 116/70 | HR 80 | Ht 63.0 in | Wt 142.0 lb

## 2015-08-03 DIAGNOSIS — E785 Hyperlipidemia, unspecified: Secondary | ICD-10-CM

## 2015-08-03 DIAGNOSIS — I2510 Atherosclerotic heart disease of native coronary artery without angina pectoris: Secondary | ICD-10-CM | POA: Diagnosis not present

## 2015-08-03 DIAGNOSIS — Z9861 Coronary angioplasty status: Secondary | ICD-10-CM | POA: Diagnosis not present

## 2015-08-03 DIAGNOSIS — R072 Precordial pain: Secondary | ICD-10-CM | POA: Diagnosis not present

## 2015-08-03 NOTE — Assessment & Plan Note (Signed)
Continue aspirin and statin. 

## 2015-08-03 NOTE — Patient Instructions (Signed)
Your physician wants you to follow-up in: ONE YEAR WITH DR CRENSHAW You will receive a reminder letter in the mail two months in advance. If you don't receive a letter, please call our office to schedule the follow-up appointment.  

## 2015-08-03 NOTE — Assessment & Plan Note (Signed)
Continue statin. 

## 2015-08-03 NOTE — Assessment & Plan Note (Signed)
Patient continues to have atypical chest pain. She has had multiple follow-up catheterizations revealing no obstructive coronary disease. Recent nuclear study showed no ischemia. Her symptoms are actually improved compared to previous. Plan medical therapy at this point.

## 2015-08-04 ENCOUNTER — Encounter: Payer: Self-pay | Admitting: Cardiology

## 2015-08-04 NOTE — Telephone Encounter (Signed)
Angelica [name] is returning your call . Thanks

## 2015-08-04 NOTE — Telephone Encounter (Signed)
This encounter was created in error - please disregard.

## 2015-09-25 ENCOUNTER — Other Ambulatory Visit: Payer: Self-pay | Admitting: Family Medicine

## 2015-09-25 DIAGNOSIS — N6489 Other specified disorders of breast: Secondary | ICD-10-CM

## 2015-10-30 ENCOUNTER — Ambulatory Visit
Admission: RE | Admit: 2015-10-30 | Discharge: 2015-10-30 | Disposition: A | Discharge status: Home / Self Care | Payer: 59 | Source: Ambulatory Visit | Attending: Family Medicine | Admitting: Family Medicine

## 2015-10-30 DIAGNOSIS — N6489 Other specified disorders of breast: Secondary | ICD-10-CM

## 2016-01-07 DIAGNOSIS — I2089 Other forms of angina pectoris: Secondary | ICD-10-CM | POA: Insufficient documentation

## 2016-01-13 ENCOUNTER — Telehealth: Payer: Self-pay | Admitting: Cardiology

## 2016-01-13 NOTE — Telephone Encounter (Signed)
Pt is in [place]. She has had some chest pain and was actually admitted there and discharged. She wanted to know who was on call for our group in case she wants to come to the hospital in [place].  Kerin Ransom PA-C 01/13/2016 2:20 PM

## 2016-01-14 NOTE — Telephone Encounter (Signed)
Pt is in [place]. She has had some chest pain and was actually admitted there and discharged. She wanted to know who was on call for our group in case she wants to come to the hospital in [place].  Kerin Ransom PA-C 01/13/2016

## 2016-01-15 ENCOUNTER — Telehealth: Payer: Self-pay | Admitting: Cardiology

## 2016-01-15 NOTE — Telephone Encounter (Signed)
Received call from patient- pt reports she was in [place] last week and was admitted at [facility] for chest pain.  Reports she has had intermittent chest pain radiating to her shoulder blades x 2.5 months, recently increased in the last 2-3 weeks, becoming for frequent and intense.  Reports when it happens she becomes pale, clammy and grey.  Reports she stayed in hospital 2 nights and no cath was indicated.  Reports she is still very concerned as she had the "widow maker" in 2012 and knows she has 2 blockages now.  Reports the pain is not constant-no specific trigger, can occur at rest or with exertion.  Denies NTG-reports she cannot take it.  Reports she has appt on Thursday 9/14 with MD Crenshaw but would like his opinion-she doesn't know what to as she is very nervous given her hx and doesn't know if she can wait til Thursday to be seen.    Advised I would route to MD for further advise or recommendations.

## 2016-01-15 NOTE — Telephone Encounter (Signed)
New message  Pt call requesting to speak with RN. Pt states she was admitted to [facility] in [place]. Pt would like to speak with RN to discuss. Please call back

## 2016-01-15 NOTE — Telephone Encounter (Signed)
Returned call to patient-no answer, lmtcb.

## 2016-01-15 NOTE — Telephone Encounter (Signed)
Will see as scheduled Thurs; if she needs sooner eval would need PAOV or ER eval. Kirk Ruths

## 2016-01-16 ENCOUNTER — Encounter: Payer: Self-pay | Admitting: Cardiology

## 2016-01-16 NOTE — Progress Notes (Signed)
HPI: FU coronary disease. Previously seen for chest pain. Stress echo was markedly abnormal with nonsustained ventricular tachycardia and ischemia in the anteroseptal wall and apex. Patient underwent cardiac catheterization in September of 2012 which revealed a 95% ostial LAD lesion. The patient had PCI with a drug-eluting stent. There was some jailing of the circumflex. Repeat cath at [facility] in Dec 2012 because of palpitations and chest pain revealed patency of LAD stent and she had no other significant CAD. Had event monitor placed in Jan 2013 that showed sinus rhythm. Carotid Dopplers December 2012 showed 0-39% bilateral stenosis. 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 Myoview in November of 2013 showed an ejection fraction of 73%. There was no ischemia. Patient has continued to have chest pain. She had repeat catheterization in May of 2014 which revealed normal left main, patent LAD stent, 50% mid LAD. There was a 30-40% ostial circumflex. Ejection fraction was 55%. Again had repeat cath 8/15 that showed no change (nonobstructive CAD). Nuclear study September 2016 showed ejection fraction 61%. There was inferior septal thinning but no ischemia. Since last seen, She was admitted in [place] withChest pain/back pain. It is similar to her previous symptoms but more intense. She ruled out for myocardial infarction with serial enzymes. She apparently had a negative nuclear study but I do not have all records available. They felt there may be a an anxiety component and she was prescribed lorazepam. She was also asked to decrease her work hours. She continues to have occasional back pain which is chronic. It is not clearly exertional. Occasional dyspnea.   Current Outpatient Prescriptions  Medication Sig Dispense Refill  . aspirin EC 81 MG tablet Take 81 mg by mouth every evening.    Marland Kitchen ibuprofen (ADVIL,MOTRIN) 200 MG tablet Take 200-400 mg by mouth  every 6 (six) hours as needed for pain or headache.    Marland Kitchen LORazepam (ATIVAN) 0.5 MG tablet Take 1 tablet by mouth daily.    . nitroGLYCERIN (NITROSTAT) 0.4 MG SL tablet Place 1 tablet (0.4 mg total) under the tongue every 5 (five) minutes as needed for chest pain. 25 tablet 12  . rosuvastatin (CRESTOR) 40 MG tablet TAKE 1 TABLET EVERY NIGHT 90 tablet 3   No current facility-administered medications for this visit.      Past Medical History:  Diagnosis Date  . ALLERGIC RHINITIS 03/02/2007  . BREAST CYST, LEFT 11/23/2009  . CAD S/P percutaneous coronary angioplasty 01/2011   a) Exertional Back Pain & High RISK ST) - 95% Ostial LAD --> PCI with Promus DES 3.0 mm x 12 mm; b) LHC 08/2012: LAD stent Patent , 50% mid LAD; c) Neg Myoview 03/2012 d). LHC 12/09/2013 40-50% mid-LAD no progression, medical therapy. Stress test if recurrent symptom  . HLD (hyperlipidemia)   . REACTIVE AIRWAY DISEASE 10/21/2008    Past Surgical History:  Procedure Laterality Date  . AUGMENTATION MAMMAPLASTY Bilateral 1990  . BREAST BIOPSY    . BREAST SURGERY     BILAT IMPLANTS  . LEFT HEART CATHETERIZATION WITH CORONARY ANGIOGRAM N/A 09/10/2012   Procedure: LEFT HEART CATHETERIZATION WITH CORONARY ANGIOGRAM;  Surgeon: Sherren Mocha, MD;  Location: [facility] CATH LAB;  Service: Cardiovascular;  Laterality: N/A;  . LEFT HEART CATHETERIZATION WITH CORONARY ANGIOGRAM N/A 12/09/2013   Procedure: LEFT HEART CATHETERIZATION WITH CORONARY ANGIOGRAM;  Surgeon: Leonie Man, MD;  Location: [facility] CATH LAB;  Service: Cardiovascular;  Laterality: N/A;  . PERCUTANEOUS  CORONARY STENT INTERVENTION (PCI-S)  01/2011   Promus Premier DES 3.0 mm x12 mm Ostial LAD (extends into LM with partial "jailing" of Cx)    Social History   Social History  . Marital status: Married    Spouse name: N/A  . Number of children: N/A  . Years of education: N/A   Occupational History  .  Charity fundraiser   Social History Main  Topics  . Smoking status: Never Smoker  . Smokeless tobacco: Never Used  . Alcohol use 0.6 - 1.2 oz/week    1 - 2 Standard drinks or equivalent per week     Comment: Occasional beer  . Drug use: No  . Sexual activity: Yes    Partners: Male    Birth control/ protection: Post-menopausal   Other Topics Concern  . Not on file   Social History Narrative   Occupation:    Never Smoked   Alcohol use- no   Married   Drug use- no   Regular Exercise- yes     Family History  Problem Relation Age of Onset  . Alcohol abuse Other   . Diabetes Other   . Hypertension Other   . Heart disease Other     ROS: no fevers or chills, productive cough, hemoptysis, dysphasia, odynophagia, melena, hematochezia, dysuria, hematuria, rash, seizure activity, orthopnea, PND, pedal edema, claudication. Remaining systems are negative.  Physical Exam: Well-developed well-nourished in no acute distress.  Skin is warm and dry.  HEENT is normal.  Neck is supple.  Chest is clear to auscultation with normal expansion.  Cardiovascular exam is regular rate and rhythm.  Abdominal exam nontender or distended. No masses palpated. Extremities show no edema. neuro grossly intact  ECG-Sinus rhythm at a rate of 64. Cannot rule out prior septal infarct.  A/P  1 Coronary artery disease-continue aspirin and statin.  2 hyperlipidemia-continue statin. Lipids and liver monitored by primary care.  3 chest pain-patient continues to have occasional back pain. Previous evaluations have been negative. She was recently admitted and enzymes negative and follow-up stress test negative by her report. She has been given a prescription for Ativan at low dose to see if this would help. We discussed options and she would like to continue to be conservative. I will see her back in 8 weeks and if she continues to have symptoms we will discuss further evaluation at that time. Our only option would be cardiac catheterization.  Kirk Ruths, MD

## 2016-01-16 NOTE — Telephone Encounter (Signed)
Spoke to patient-aware of MD recommendations.  Verbalized understanding.

## 2016-01-18 ENCOUNTER — Ambulatory Visit (INDEPENDENT_AMBULATORY_CARE_PROVIDER_SITE_OTHER): Payer: 59 | Admitting: Cardiology

## 2016-01-18 ENCOUNTER — Encounter: Payer: Self-pay | Admitting: Cardiology

## 2016-01-18 VITALS — BP 120/78 | HR 64 | Ht 64.0 in | Wt 140.8 lb

## 2016-01-18 DIAGNOSIS — I2510 Atherosclerotic heart disease of native coronary artery without angina pectoris: Secondary | ICD-10-CM | POA: Diagnosis not present

## 2016-01-18 DIAGNOSIS — E785 Hyperlipidemia, unspecified: Secondary | ICD-10-CM

## 2016-01-18 DIAGNOSIS — R072 Precordial pain: Secondary | ICD-10-CM | POA: Diagnosis not present

## 2016-01-18 NOTE — Patient Instructions (Signed)
Your physician recommends that you schedule a follow-up appointment in: 8 WEEKS WITH DR CRENSHAW  

## 2016-03-06 ENCOUNTER — Ambulatory Visit (INDEPENDENT_AMBULATORY_CARE_PROVIDER_SITE_OTHER): Payer: 59 | Admitting: Obstetrics and Gynecology

## 2016-03-06 ENCOUNTER — Encounter: Payer: Self-pay | Admitting: Obstetrics and Gynecology

## 2016-03-06 ENCOUNTER — Telehealth: Payer: Self-pay

## 2016-03-06 VITALS — BP 124/76 | HR 70 | Resp 16 | Ht 61.5 in | Wt 140.0 lb

## 2016-03-06 DIAGNOSIS — Z01419 Encounter for gynecological examination (general) (routine) without abnormal findings: Secondary | ICD-10-CM

## 2016-03-06 DIAGNOSIS — Z0000 Encounter for general adult medical examination without abnormal findings: Secondary | ICD-10-CM | POA: Diagnosis not present

## 2016-03-06 DIAGNOSIS — I499 Cardiac arrhythmia, unspecified: Secondary | ICD-10-CM | POA: Diagnosis not present

## 2016-03-06 LAB — POCT URINALYSIS DIPSTICK
BILIRUBIN UA: NEGATIVE
Glucose, UA: NEGATIVE
Ketones, UA: NEGATIVE
NITRITE UA: NEGATIVE
PH UA: 5
Protein, UA: NEGATIVE
UROBILINOGEN UA: NEGATIVE

## 2016-03-06 MED ORDER — ZOSTER VACCINE LIVE 19400 UNT/0.65ML ~~LOC~~ SUSR: 0.6500 mL | Freq: Once | SUBCUTANEOUS | 0 refills | Status: DC

## 2016-03-06 NOTE — Patient Instructions (Signed)

## 2016-03-06 NOTE — Progress Notes (Signed)
Called [facility] at [place] while in office. Spoke with Lenna Sciara, patient scheduled with Rosaria Ferries, PA 03/07/16 at 3pm for evaluation of cardia arrhythmia as requested by Dr. Quincy Simmonds. Patient declined appt for 03/06/16. Dr. Stanford Breed out of the office until next week. Patient has appt previously scheduled with Dr. Stanford Breed 03/18/16, will also keep that appt for follow-up. Patient verbalizes understanding and is agreeable to date and time. Patient provided aith written contact information and appointment time/date.

## 2016-03-06 NOTE — Telephone Encounter (Signed)
Spoke with Verdis Frederickson at [facility] to verify they received cancellation on Rx for Zostavax from [facility]. They received RX--advised them to not fill. They will cancel order.

## 2016-03-06 NOTE — Progress Notes (Signed)
60 y.o. G93P0000 Married Caucasian female here for annual exam.    Dealing with chest pains.  Now taking Lorazepam. Working 80 hours a week. Thinks she may need to do a cardiac cath.  Hx CAD.  Has already done 3 caths of heart.  Just feels tired and does not feel well.   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  No vacation in about 13 years.  Husband disabled.   PCP: Anastasia Pall, MD   Patient's last menstrual period was 02/03/2013 (approximate).           Sexually active: Yes.   female The current method of family planning is post menopausal status.    Exercising: No.   Smoker:  no  Health Maintenance: Pap:  02-21-14 Neg:Neg HR HPV History of abnormal Pap:  no MMG:  See Epic. Mammogram 3D 0 10/30/15 - Has distortion of the left breast. Cat C density. BI-RADS 3.  Due for dx mammogram and ultrasound in December 2017.    [facility]  Colonoscopy:  12-14-07 normal with Dr.Jacobs;next due 12/2017. BMD: ?2010  Result  Normal per patient TDaP:  2012 Gardasil:   N/A Hep C:  Negative with PCP in 2016.  Screening Labs:  Hb today: PCP, Urine today: Trace WBCs, Trace RBCs--pt.states some frequency but this is not unusual for her.   reports that she has never smoked. She has never used smokeless tobacco. She reports that she drinks about 0.6 - 1.2 oz of alcohol per week . She reports that she does not use drugs.  Past Medical History:  Diagnosis Date  . ALLERGIC RHINITIS 03/02/2007  . BREAST CYST, LEFT 11/23/2009  . CAD S/P percutaneous coronary angioplasty 01/2011   a) Exertional Back Pain & High RISK ST) - 95% Ostial LAD --> PCI with Promus DES 3.0 mm x 12 mm; b) LHC 08/2012: LAD stent Patent , 50% mid LAD; c) Neg Myoview 03/2012 d). LHC 12/09/2013 40-50% mid-LAD no progression, medical therapy. Stress test if recurrent symptom  . HLD (hyperlipidemia)   . REACTIVE AIRWAY DISEASE 10/21/2008    Past Surgical History:  Procedure Laterality Date  . AUGMENTATION MAMMAPLASTY  Bilateral 1990  . BREAST BIOPSY    . BREAST SURGERY     BILAT IMPLANTS  . LEFT HEART CATHETERIZATION WITH CORONARY ANGIOGRAM N/A 09/10/2012   Procedure: LEFT HEART CATHETERIZATION WITH CORONARY ANGIOGRAM;  Surgeon: Sherren Mocha, MD;  Location: [facility] CATH LAB;  Service: Cardiovascular;  Laterality: N/A;  . LEFT HEART CATHETERIZATION WITH CORONARY ANGIOGRAM N/A 12/09/2013   Procedure: LEFT HEART CATHETERIZATION WITH CORONARY ANGIOGRAM;  Surgeon: Leonie Man, MD;  Location: [facility] CATH LAB;  Service: Cardiovascular;  Laterality: N/A;  . PERCUTANEOUS CORONARY STENT INTERVENTION (PCI-S)  01/2011   Promus Premier DES 3.0 mm x12 mm Ostial LAD (extends into LM with partial "jailing" of Cx)    Current Outpatient Prescriptions  Medication Sig Dispense Refill  . aspirin EC 81 MG tablet Take 81 mg by mouth every evening.    Marland Kitchen ibuprofen (ADVIL,MOTRIN) 200 MG tablet Take 200-400 mg by mouth every 6 (six) hours as needed for pain or headache.    . nitroGLYCERIN (NITROSTAT) 0.4 MG SL tablet Place 1 tablet (0.4 mg total) under the tongue every 5 (five) minutes as needed for chest pain. 25 tablet 12  . rosuvastatin (CRESTOR) 40 MG tablet TAKE 1 TABLET EVERY NIGHT 90 tablet 3   No current facility-administered medications for this visit.     Family History  Problem Relation Age of Onset  . Alcohol abuse Other   . Diabetes Other   . Hypertension Other   . Heart disease Other     ROS:  Pertinent items are noted in HPI.  Otherwise, a comprehensive ROS was negative.  Exam:   BP 124/76 (BP Location: Right Arm, Patient Position: Sitting, Cuff Size: Normal)   Pulse 70   Resp 16   Ht 5' 1.5" (1.562 m)   Wt 140 lb (63.5 kg)   LMP 02/03/2013 (Approximate)   BMI 26.02 kg/m     General appearance: alert, cooperative and appears stated age Head: Normocephalic, without obvious abnormality, atraumatic Neck: no adenopathy, supple, symmetrical, trachea midline and thyroid normal to inspection and palpation Lungs:  clear to auscultation bilaterally Breasts: normal appearance, no masses or tenderness, No nipple retraction or dimpling, No nipple discharge or bleeding, No axillary or supraclavicular adenopathy Heart: regular rate and irregular rhythm.  Abdomen: soft, non-tender; no masses, no organomegaly Extremities: extremities normal, atraumatic, no cyanosis or edema Skin: Skin color, texture, turgor normal. No rashes or lesions Lymph nodes: Cervical, supraclavicular, and axillary nodes normal. No abnormal inguinal nodes palpated Neurologic: Grossly normal  Pelvic: External genitalia:  no lesions              Urethra:  normal appearing urethra with no masses, tenderness or lesions              Bartholins and Skenes: normal                 Vagina: normal appearing vagina with normal color and discharge, no lesions              Cervix: no lesions              Pap taken: No. Bimanual Exam:  Uterus:  normal size, contour, position, consistency, mobility, non-tender              Adnexa: no mass, fullness, tenderness              Rectal exam: Yes.  .  Confirms.              Anus:  normal sphincter tone, no lesions  Chaperone was present for exam.  Assessment:   Well woman visit with normal exam. Bilateral breast implants. Hx CAD.  Arrhythmia.  Situation stress.   Plan: Yearly mammogram recommended after age 22.  Recommended self breast exam.  Pap and HR HPV as above. Discussed Calcium, Vitamin D, regular exercise program including cardiovascular and weight bearing exercise. Recommend flu vaccine.  Zostavax through PCP.  Will have patient follow up with cardiology, Dr. Stanford Breed.  Our office will assist in the scheduling of this.  I have instructed her to call 911 if she has a difference in her baseline chest pain, has sweating or nausea, or increased symptoms of malaise.  Brochure for counseling through [employer].  She will consider this. Follow up annually and prn.       After visit summary  provided.

## 2016-03-07 ENCOUNTER — Encounter: Payer: Self-pay | Admitting: Physician Assistant

## 2016-03-07 ENCOUNTER — Ambulatory Visit (INDEPENDENT_AMBULATORY_CARE_PROVIDER_SITE_OTHER): Payer: 59 | Admitting: Physician Assistant

## 2016-03-07 VITALS — BP 115/82 | HR 67 | Ht 61.5 in | Wt 139.8 lb

## 2016-03-07 DIAGNOSIS — E785 Hyperlipidemia, unspecified: Secondary | ICD-10-CM

## 2016-03-07 DIAGNOSIS — I2510 Atherosclerotic heart disease of native coronary artery without angina pectoris: Secondary | ICD-10-CM | POA: Diagnosis not present

## 2016-03-07 NOTE — Progress Notes (Signed)
Cardiology Office Note   Date:  03/07/2016   ID:  [name], Angelica 1955/10/17, MRN [id number]  PCP:  Chesley Noon, MD  Cardiologist:  Dr Stanford Breed 01/18/2016 Angelica Ferries, PA-C   Chief Complaint  Patient presents with  . Back Pain  . Irregular Heart Beat    History of Present Illness: Angelica [name] is a 60 y.o. female with a history of DES oLAD 2012, patent stent w/ mLAD 50% cath 2015, HLD, reactive airway dz  01/18/2016, seen by Dr Stanford Breed, data reviewed from recent admit in [place], pt reported ez neg MI and MV nl. His note says if sx continue, she will need cath.    Angelica [name] presents for Evaluation of chest pain.  Angelica [name] gets chest pain on just about a daily basis. She has chest pain almost every day. When she gets a good night's sleep, it will be very mild. When she does not get enough sleep or has worked a 16 hour day the day before, chest pain will be much worse. It is in her medial right scapular area. She does not think it is worse with deep inspiration but is not sure. She doesn't think it changes with position. It has no relation to exertion. It has no relation to meals or what she eats. She has tried Advil for it which can help. She has not tried anything else for it consistently.  Angelica [name] reports that she was able to get in to see Dr. Harlow Mares in [place]. She she states that the stress test was a Myoview that was normal and had no significant abnormalities.  She was at her family 31 office yesterday, and was told that there was something abnormal about her heart. She was not told what that was. Hearing her description, I wonder if she was having PVCs or other arrhythmia. She has not had palpitations or felt that her heart was beating irregularly.  She states that she has been told by both Dr. Harlow Mares and her primary care physician that her lifestyle in particular her work light is significantly contributing to her  symptoms. She reluctantly agrees with this assessment. She admits that she routinely works 80 hours or more per week. She states that she has problems letting go up for in the evenings and will frequently keep herself awake at night thinking about work issues or problems. She admits that stress from work may be causing her significant problems and states that she has a plan in place to reduce this. However, when she talks about exactly what that is, it is not clear that this will ever actually happened. She describes it as she would be training someone to assist her. There is someone, but he is not able to do what she can do and is not actually willing to try to do everything that she does.   Past Medical History:  Diagnosis Date  . ALLERGIC RHINITIS 03/02/2007  . BREAST CYST, LEFT 11/23/2009  . CAD S/P percutaneous coronary angioplasty 01/2011   a) Exertional Back Pain & High RISK ST) - 95% Ostial LAD --> PCI with Promus DES 3.0 mm x 12 mm; b) LHC 08/2012: LAD stent Patent , 50% mid LAD; c) Neg Myoview 03/2012 d). LHC 12/09/2013 40-50% mid-LAD no progression, medical therapy. Stress test if recurrent symptom  . HLD (hyperlipidemia)   . REACTIVE AIRWAY DISEASE 10/21/2008    Past Surgical History:  Procedure Laterality Date  . AUGMENTATION MAMMAPLASTY Bilateral 1990  .  BREAST BIOPSY    . BREAST SURGERY     BILAT IMPLANTS  . LEFT HEART CATHETERIZATION WITH CORONARY ANGIOGRAM N/A 09/10/2012   Procedure: LEFT HEART CATHETERIZATION WITH CORONARY ANGIOGRAM;  Surgeon: Sherren Mocha, MD;  Location: [facility] CATH LAB;  Service: Cardiovascular;  Laterality: N/A;  . LEFT HEART CATHETERIZATION WITH CORONARY ANGIOGRAM N/A 12/09/2013   Procedure: LEFT HEART CATHETERIZATION WITH CORONARY ANGIOGRAM;  Surgeon: Leonie Man, MD;  Location: [facility] CATH LAB;  Service: Cardiovascular;  Laterality: N/A;  . PERCUTANEOUS CORONARY STENT INTERVENTION (PCI-S)  01/2011   Promus Premier DES 3.0 mm x12 mm Ostial LAD (extends into LM with  partial "jailing" of Cx)    Current Outpatient Prescriptions  Medication Sig Dispense Refill  . aspirin EC 81 MG tablet Take 81 mg by mouth every evening.    Marland Kitchen ibuprofen (ADVIL,MOTRIN) 200 MG tablet Take 200-400 mg by mouth every 6 (six) hours as needed for pain or headache.    . nitroGLYCERIN (NITROSTAT) 0.4 MG SL tablet Place 1 tablet (0.4 mg total) under the tongue every 5 (five) minutes as needed for chest pain. 25 tablet 12  . rosuvastatin (CRESTOR) 40 MG tablet TAKE 1 TABLET EVERY NIGHT 90 tablet 3   No current facility-administered medications for this visit.     Allergies:   Prednisone    Social History:  The patient  reports that she has never smoked. She has never used smokeless tobacco. She reports that she drinks about 0.6 - 1.2 oz of alcohol per week . She reports that she does not use drugs.   Family History:  The patient's family history includes Alcohol abuse in her other; Diabetes in her other; Heart disease in her other; Hypertension in her other.    ROS:  Please see the history of present illness. All other systems are reviewed and negative.    PHYSICAL EXAM: VS:  BP 115/82   Pulse 67   Ht 5' 1.5" (1.562 m)   Wt 139 lb 12.8 oz (63.4 kg)   LMP 02/03/2013 (Approximate)   BMI 25.99 kg/m  , BMI Body mass index is 25.99 kg/m. GEN: Well nourished, well developed, female in no acute distress  HEENT: normal for age  Neck: no JVD, no carotid bruit, no masses Cardiac: RRR; no murmur, no rubs, or gallops; no skips or extra beats Respiratory:  clear to auscultation bilaterally, normal work of breathing GI: soft, nontender, nondistended, + BS MS: no deformity or atrophy; no edema; distal pulses are 2+ in all 4 extremities   Skin: warm and dry, no rash Neuro:  Strength and sensation are intact Psych: euthymic mood, full affect   EKG:  EKG is ordered today. The ekg ordered today demonstrates sinus rhythm, no acute ischemic changes, no Q waves indicating an old MI. No  arrhythmia. QRS complexes are very small in lead 3, similar to 01/18/2016 ECG.  Carotid Dopplers December 2012 showed 0-39% bilateral stenosis.   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   Recent Labs: No results found for requested labs within last 8760 hours.    Lipid Panel    Component Value Date/Time   CHOL 145 03/30/2014 0818   TRIG 48.0 03/30/2014 0818   TRIG 67 03/13/2006 1112   HDL 80.10 03/30/2014 0818   CHOLHDL 2 03/30/2014 0818   VLDL 9.6 03/30/2014 0818   LDLCALC 55 03/30/2014 0818   LDLDIRECT 101.2 11/15/2008 0943     Wt Readings from  Last 3 Encounters:  03/07/16 139 lb 12.8 oz (63.4 kg)  03/06/16 140 lb (63.5 kg)  01/18/16 140 lb 12.8 oz (63.9 kg)     Other studies Reviewed: Additional studies/ records that were reviewed today include: Office notes, hospital records and testing.  ASSESSMENT AND PLAN:  1.  Chest pain: Her symptoms are atypical and prolonged. When she had significant coronary artery disease, and needed a stent in her LAD, she had a stress test and was very symptomatic with it. She had an nonsustained VT on the treadmill, and was extremely symptomatic. This time, she was able to do the treadmill and did well with it. She did not have any arrhythmias during the stress test and the images were without ischemia or infarction according to the patient.     I advised her that I was very much reassured by this. Her symptoms are similar to her pre-PCI symptoms, but her pre-PCI symptoms were actually much more severe. The symptoms are not ongoing as they were when she needed a PCI, they are not associated with diaphoresis and nausea/vomiting, and she has had no arrhythmias.     Therefore, since she had a recent stress test, I do not feel strongly that any further evaluation is needed.     In discussing the situation with the patient and her husband, they recognize that she has a very stressful job and is  working far more hours and then are reasonable for her to do long term.   I advised her that cardiac catheterization would be the next test to do. I advised that I would be happy to order it but I did not feel strongly about it. She and her husband agree. If her symptoms worsen, become more reminiscent of her pre-PCI symptoms, or she desires further testing, she will contact us. Otherwise, she is to continue to explore ways to decrease her work load and reduce the expectation that she will work 80 hour weeks every week in order to get the job done. She has been encouraged by her primary care physician to get counseling and I heartily concur with this.  2. Hyperlipidemia: She does not remember having a recent lipid profile. She is encouraged to get one either through [place] or her primary care physician.   Current medicines are reviewed at length with the patient today.  The patient does not have concerns regarding medicines.  The following changes have been made:  no change  Labs/ tests ordered today include:  No orders of the defined types were placed in this encounter.    Disposition:   FU with Dr Stanford Breed  Signed, Angelica Ferries, PA-C  03/07/2016 3:17 PM    [place] Phone: [phone]; Fax: [phone]  This note was written with the assistance of speech recognition software. Please excuse any transcriptional errors.

## 2016-03-07 NOTE — Patient Instructions (Signed)
Medication Instructions:  Your physician recommends that you continue on your current medications as directed. Please refer to the Current Medication list given to you today.  Labwork: NONE   Testing/Procedures: NONE   Follow-Up: Your physician recommends that you schedule a follow-up appointment in: [place].  Any Other Special Instructions Will Be Listed Below (If Applicable).     If you need a refill on your cardiac medications before your next appointment, please call your pharmacy.

## 2016-03-14 NOTE — Progress Notes (Signed)
HPI: FU coronary disease. Previously seen for chest pain. Stress echo was markedly abnormal with nonsustained ventricular tachycardia and ischemia in the anteroseptal wall and apex. Patient underwent cardiac catheterization in September of 2012 which revealed a 95% ostial LAD lesion. The patient had PCI with a drug-eluting stent. There was some jailing of the circumflex. Repeat cath at [facility] in Dec 2012 because of palpitations and chest pain revealed patency of LAD stent and she had no other significant CAD. Had event monitor placed in Jan 2013 that showed sinus rhythm. Carotid Dopplers December 2012 showed 0-39% bilateral stenosis. 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 There was no ischemia. Patient has continued to have chest pain. She had repeat catheterization in May of 2014 which revealed normal left main, patent LAD stent, 50% mid LAD. There was a 30-40% ostial circumflex. Ejection fraction was 55%. Again had repeat cath 8/15 that showed no change (nonobstructive CAD). Nuclear study September 2016 showed ejection fraction 61%. There was inferior septal thinning but no ischemia.  Admitted in [place] in August 2017 with chest pain and ruled out. Apparently had a negative nuclear study. No records available. Since last seen she continues to have occasional chest pain. It is sharp and lasts for seconds. Occurs both with exertion and at rest. She has some dyspnea on exertion but no orthopnea, PND or pedal edema. No syncope.  Current Outpatient Prescriptions  Medication Sig Dispense Refill  . aspirin EC 81 MG tablet Take 81 mg by mouth every evening.    Marland Kitchen ibuprofen (ADVIL,MOTRIN) 200 MG tablet Take 200-400 mg by mouth every 6 (six) hours as needed for pain or headache.    . nitroGLYCERIN (NITROSTAT) 0.4 MG SL tablet Place 1 tablet (0.4 mg total) under the tongue every 5 (five) minutes as needed for chest pain. 25 tablet 12  . rosuvastatin  (CRESTOR) 40 MG tablet TAKE 1 TABLET EVERY NIGHT 90 tablet 3   No current facility-administered medications for this visit.      Past Medical History:  Diagnosis Date  . ALLERGIC RHINITIS 03/02/2007  . BREAST CYST, LEFT 11/23/2009  . CAD S/P percutaneous coronary angioplasty 01/2011   a) Exertional Back Pain & High RISK ST) - 95% Ostial LAD --> PCI with Promus DES 3.0 mm x 12 mm; b) LHC 08/2012: LAD stent Patent , 50% mid LAD; c) Neg Myoview 03/2012 d). LHC 12/09/2013 40-50% mid-LAD no progression, medical therapy. Stress test if recurrent symptom  . HLD (hyperlipidemia)   . REACTIVE AIRWAY DISEASE 10/21/2008    Past Surgical History:  Procedure Laterality Date  . AUGMENTATION MAMMAPLASTY Bilateral 1990  . BREAST BIOPSY    . BREAST SURGERY     BILAT IMPLANTS  . LEFT HEART CATHETERIZATION WITH CORONARY ANGIOGRAM N/A 09/10/2012   Procedure: LEFT HEART CATHETERIZATION WITH CORONARY ANGIOGRAM;  Surgeon: Sherren Mocha, MD;  Location: [facility] CATH LAB;  Service: Cardiovascular;  Laterality: N/A;  . LEFT HEART CATHETERIZATION WITH CORONARY ANGIOGRAM N/A 12/09/2013   Procedure: LEFT HEART CATHETERIZATION WITH CORONARY ANGIOGRAM;  Surgeon: Leonie Man, MD;  Location: [facility] CATH LAB;  Service: Cardiovascular;  Laterality: N/A;  . PERCUTANEOUS CORONARY STENT INTERVENTION (PCI-S)  01/2011   Promus Premier DES 3.0 mm x12 mm Ostial LAD (extends into LM with partial "jailing" of Cx)    Social History   Social History  . Marital status: Married    Spouse name: N/A  . Number of children: N/A  .  Years of education: N/A   Occupational History  .  Charity fundraiser   Social History Main Topics  . Smoking status: Never Smoker  . Smokeless tobacco: Never Used  . Alcohol use 0.6 - 1.2 oz/week    1 - 2 Standard drinks or equivalent per week     Comment: Occasional beer  . Drug use: No  . Sexual activity: Yes    Partners: Male    Birth control/ protection: Post-menopausal    Other Topics Concern  . Not on file   Social History Narrative   Occupation:    Never Smoked   Alcohol use- no   Married   Drug use- no   Regular Exercise- yes     Family History  Problem Relation Age of Onset  . Alcohol abuse Other   . Diabetes Other   . Hypertension Other   . Heart disease Other     ROS: no fevers or chills, productive cough, hemoptysis, dysphasia, odynophagia, melena, hematochezia, dysuria, hematuria, rash, seizure activity, orthopnea, PND, pedal edema, claudication. Remaining systems are negative.  Physical Exam: Well-developed well-nourished in no acute distress.  Skin is warm and dry.  HEENT is normal.  Neck is supple.  Chest is clear to auscultation with normal expansion.  Cardiovascular exam is regular rate and rhythm.  Abdominal exam nontender or distended. No masses palpated. Extremities show no edema. neuro grossly intact  ECG-sinus rhythm at a rate of 72. Prior septal infarct cannot be excluded.  A/P  1 coronary artery disease-continue aspirin and statin.  2 hyperlipidemia-continue statin.  3 chest pain-patient continues to have intermittent chest pain. This has been long-standing. Previous evaluations have been negative including repeat catheterization and nuclear study. Our only option for further evaluation would be repeat catheterization and I do not think this is indicated at present. She is in agreement and would prefer conservative measures. We will have her return in 6 months or sooner if necessary. Kirk Ruths, MD

## 2016-03-18 ENCOUNTER — Encounter: Payer: Self-pay | Admitting: Cardiology

## 2016-03-18 ENCOUNTER — Ambulatory Visit (INDEPENDENT_AMBULATORY_CARE_PROVIDER_SITE_OTHER): Payer: 59 | Admitting: Cardiology

## 2016-03-18 VITALS — BP 128/88 | HR 76 | Ht 61.0 in | Wt 139.0 lb

## 2016-03-18 DIAGNOSIS — E7800 Pure hypercholesterolemia, unspecified: Secondary | ICD-10-CM | POA: Diagnosis not present

## 2016-03-18 DIAGNOSIS — R072 Precordial pain: Secondary | ICD-10-CM | POA: Diagnosis not present

## 2016-03-18 DIAGNOSIS — I2510 Atherosclerotic heart disease of native coronary artery without angina pectoris: Secondary | ICD-10-CM | POA: Diagnosis not present

## 2016-03-18 NOTE — Patient Instructions (Signed)
Your physician wants you to follow-up in: 6 MONTHS WITH DR CRENSHAW You will receive a reminder letter in the mail two months in advance. If you don't receive a letter, please call our office to schedule the follow-up appointment.   If you need a refill on your cardiac medications before your next appointment, please call your pharmacy.  

## 2016-05-27 ENCOUNTER — Other Ambulatory Visit: Payer: Self-pay | Admitting: Family Medicine

## 2016-05-27 ENCOUNTER — Telehealth: Payer: Self-pay | Admitting: Cardiology

## 2016-05-27 DIAGNOSIS — I25110 Atherosclerotic heart disease of native coronary artery with unstable angina pectoris: Secondary | ICD-10-CM

## 2016-05-27 DIAGNOSIS — R928 Other abnormal and inconclusive findings on diagnostic imaging of breast: Secondary | ICD-10-CM

## 2016-05-27 MED ORDER — ROSUVASTATIN CALCIUM 40 MG PO TABS: ORAL_TABLET | ORAL | 1 refills | Status: DC

## 2016-05-27 NOTE — Telephone Encounter (Signed)
Ms. Stepler is calling because she only has about 4 tablets left for her Crestor, sent in note for refill team to refill it. She would like to know If we had any Crestor 40 mg samples available in the event that she ran out, please cal. Thanks.

## 2016-05-27 NOTE — Telephone Encounter (Signed)
°*  STAT* If patient is at the pharmacy, call can be transferred to refill team.   1. Which medications need to be refilled? (please list name of each medication and dose if known) Crestor 40 mg  2. Which pharmacy/location (including street and city if local pharmacy) is medication to be sent to? [place]  3. Do they need a 30 day or 90 day supply? 90 day

## 2016-05-27 NOTE — Telephone Encounter (Signed)
Spoke w patient, refill sent to [facility] at her request. Discussed potential lapse of statin is fine for a few days, no samples for this med but offered Rx to also be sent to local pharmacy for continuity if she prefers. Pt will see how long package will take from [facility] - if more than a few days she'll call for local Rx to be sent.

## 2016-06-03 ENCOUNTER — Ambulatory Visit
Admission: RE | Admit: 2016-06-03 | Discharge: 2016-06-03 | Disposition: A | Discharge status: Home / Self Care | Payer: Self-pay | Source: Ambulatory Visit | Attending: Family Medicine | Admitting: Family Medicine

## 2016-06-03 ENCOUNTER — Other Ambulatory Visit: Payer: Self-pay | Admitting: Family Medicine

## 2016-06-03 ENCOUNTER — Other Ambulatory Visit: Payer: Self-pay | Admitting: Obstetrics and Gynecology

## 2016-06-03 ENCOUNTER — Other Ambulatory Visit: Payer: 59

## 2016-06-03 ENCOUNTER — Ambulatory Visit
Admission: RE | Admit: 2016-06-03 | Discharge: 2016-06-03 | Disposition: A | Discharge status: Home / Self Care | Payer: Managed Care, Other (non HMO) | Source: Ambulatory Visit | Attending: Family Medicine | Admitting: Family Medicine

## 2016-06-03 DIAGNOSIS — R928 Other abnormal and inconclusive findings on diagnostic imaging of breast: Secondary | ICD-10-CM

## 2016-06-17 ENCOUNTER — Telehealth: Payer: Self-pay | Admitting: Cardiology

## 2016-06-17 NOTE — Telephone Encounter (Signed)
Spoke to patient, advised, she voiced understanding of recommendations and thanks.

## 2016-06-17 NOTE — Telephone Encounter (Signed)
Pt of Dr. Stanford Breed  Had issue last week, resolved. Fine now, friends and family wanted her to touch base.  She had swelling in hands, feet, ankles, spontaneous in onset on Wednesday of last week. Had resolved completely on Friday. "I feel kind of stupid calling now about it". Notes she gets a little ankle swelling wearing heels from time to time, but nothing this bad.  Possible med reaction?  Seasonal/environmental allergy? Chemical exposure? New food?  Pt denies. Nothing different from her usual regimen.  Only thing of note is that she'd just gotten over a viral illness just prior.  She's going to be flying next week, and wants to know if any concerns. Advised her to monitor for any return of symptoms, and informed patient I would send to Dr. Stanford Breed for review and any advice.

## 2016-06-17 NOTE — Telephone Encounter (Signed)
[name] is calling and states that last tue/wed she had swelling in her fingers (both hands) and in both ankles. She states that due to swelling she could not put her ring on and she could not put her shoes on. Please call, thanks.

## 2016-06-17 NOTE — Telephone Encounter (Signed)
No changes; ok to fly Angelica [name]

## 2016-10-30 NOTE — Progress Notes (Signed)
HPI: FU coronary disease. Previously seen for chest pain. Stress echo was markedly abnormal with nonsustained ventricular tachycardia and ischemia in the anteroseptal wall and apex. Patient underwent cardiac catheterization in September of 2012 which revealed a 95% ostial LAD lesion. The patient had PCI with a drug-eluting stent. There was some jailing of the circumflex. Repeat cath at [facility] in Dec 2012 because of palpitations and chest pain revealed patency of LAD stent and she had no other significant CAD. Had event monitor placed in Jan 2013 that showed sinus rhythm. Carotid Dopplers December 2012 showed 0-39% bilateral stenosis. 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 There was no ischemia. Patient has continued to have chest pain. She had repeat catheterization in May of 2014 which revealed normal left main, patent LAD stent, 50% mid LAD. There was a 30-40% ostial circumflex. Ejection fraction was 55%. Again had repeat cath 8/15 that showed no change (nonobstructive CAD). Nuclear study September 2016 showed ejection fraction 61%. There was inferior septal thinning but no ischemia.  Admitted in [place] in August 2017 with chest pain and ruled out. Apparently had a negative nuclear study. No records available. Since last seen she has dyspnea with more vigorous activities. No orthopnea, PND, pedal edema, palpitations or syncope. She continues to have occasional shoulder pain under stressful situations but does not have exertional chest pain.  Current Outpatient Prescriptions  Medication Sig Dispense Refill  . aspirin EC 81 MG tablet Take 81 mg by mouth every evening.    Marland Kitchen ibuprofen (ADVIL,MOTRIN) 200 MG tablet Take 200-400 mg by mouth every 6 (six) hours as needed for pain or headache.    . nitroGLYCERIN (NITROSTAT) 0.4 MG SL tablet Place 1 tablet (0.4 mg total) under the tongue every 5 (five) minutes as needed for chest pain. 25 tablet 12    . rosuvastatin (CRESTOR) 40 MG tablet TAKE 1 TABLET EVERY NIGHT 90 tablet 1   No current facility-administered medications for this visit.      Past Medical History:  Diagnosis Date  . ALLERGIC RHINITIS 03/02/2007  . BREAST CYST, LEFT 11/23/2009  . CAD S/P percutaneous coronary angioplasty 01/2011   a) Exertional Back Pain & High RISK ST) - 95% Ostial LAD --> PCI with Promus DES 3.0 mm x 12 mm; b) LHC 08/2012: LAD stent Patent , 50% mid LAD; c) Neg Myoview 03/2012 d). LHC 12/09/2013 40-50% mid-LAD no progression, medical therapy. Stress test if recurrent symptom  . HLD (hyperlipidemia)   . REACTIVE AIRWAY DISEASE 10/21/2008    Past Surgical History:  Procedure Laterality Date  . AUGMENTATION MAMMAPLASTY Bilateral 1990  . BREAST BIOPSY    . BREAST SURGERY     BILAT IMPLANTS  . LEFT HEART CATHETERIZATION WITH CORONARY ANGIOGRAM N/A 09/10/2012   Procedure: LEFT HEART CATHETERIZATION WITH CORONARY ANGIOGRAM;  Surgeon: Sherren Mocha, MD;  Location: [facility] CATH LAB;  Service: Cardiovascular;  Laterality: N/A;  . LEFT HEART CATHETERIZATION WITH CORONARY ANGIOGRAM N/A 12/09/2013   Procedure: LEFT HEART CATHETERIZATION WITH CORONARY ANGIOGRAM;  Surgeon: Leonie Man, MD;  Location: [facility] CATH LAB;  Service: Cardiovascular;  Laterality: N/A;  . PERCUTANEOUS CORONARY STENT INTERVENTION (PCI-S)  01/2011   Promus Premier DES 3.0 mm x12 mm Ostial LAD (extends into LM with partial "jailing" of Cx)    Social History   Social History  . Marital status: Married    Spouse name: N/A  . Number of children: N/A  . Years  of education: N/A   Occupational History  .  Charity fundraiser   Social History Main Topics  . Smoking status: Never Smoker  . Smokeless tobacco: Never Used  . Alcohol use 0.6 - 1.2 oz/week    1 - 2 Standard drinks or equivalent per week     Comment: Occasional beer  . Drug use: No  . Sexual activity: Yes    Partners: Male    Birth control/ protection:  Post-menopausal   Other Topics Concern  . Not on file   Social History Narrative   Occupation:    Never Smoked   Alcohol use- no   Married   Drug use- no   Regular Exercise- yes     Family History  Problem Relation Age of Onset  . Alcohol abuse Other   . Diabetes Other   . Hypertension Other   . Heart disease Other     ROS: no fevers or chills, productive cough, hemoptysis, dysphasia, odynophagia, melena, hematochezia, dysuria, hematuria, rash, seizure activity, orthopnea, PND, pedal edema, claudication. Remaining systems are negative.  Physical Exam: Well-developed well-nourished in no acute distress.  Skin is warm and dry.  HEENT is normal.  Neck is supple.  Chest is clear to auscultation with normal expansion.  Cardiovascular exam is regular rate and rhythm.  Abdominal exam nontender or distended. No masses palpated. Extremities show no edema. neuro grossly intact  ECG- Sinus rhythm with incomplete LBBB; personally reviewed  A/P  1 CAD-continue ASA and statin. 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  2 Hyperlipidemia-continue statin.   3 Chest pain-symptoms are chronic and unchanged. Given new incomplete left bundle branch block we will arrange a [place] nuclear study for risk stratification.  Kirk Ruths, MD

## 2016-11-13 ENCOUNTER — Ambulatory Visit (INDEPENDENT_AMBULATORY_CARE_PROVIDER_SITE_OTHER): Payer: Managed Care, Other (non HMO) | Admitting: Cardiology

## 2016-11-13 ENCOUNTER — Encounter: Payer: Self-pay | Admitting: Cardiology

## 2016-11-13 VITALS — BP 130/78 | HR 71 | Ht 61.0 in | Wt 142.0 lb

## 2016-11-13 DIAGNOSIS — I2510 Atherosclerotic heart disease of native coronary artery without angina pectoris: Secondary | ICD-10-CM | POA: Diagnosis not present

## 2016-11-13 DIAGNOSIS — E7800 Pure hypercholesterolemia, unspecified: Secondary | ICD-10-CM | POA: Diagnosis not present

## 2016-11-13 DIAGNOSIS — R072 Precordial pain: Secondary | ICD-10-CM | POA: Diagnosis not present

## 2016-11-13 DIAGNOSIS — I25110 Atherosclerotic heart disease of native coronary artery with unstable angina pectoris: Secondary | ICD-10-CM | POA: Diagnosis not present

## 2016-11-13 MED ORDER — ROSUVASTATIN CALCIUM 40 MG PO TABS: ORAL_TABLET | ORAL | 3 refills | Status: DC

## 2016-11-13 NOTE — Patient Instructions (Signed)
Medication Instructions:   NO CHANGE  Testing/Procedures:  Your physician has requested that you have a lexiscan myoview. For further information please visit [email or web address]. Please follow instruction sheet, as given.    Follow-Up:  Your physician wants you to follow-up in: 6 MONTHS WITH DR CRENSHAW You will receive a reminder letter in the mail two months in advance. If you don't receive a letter, please call our office to schedule the follow-up appointment.   If you need a refill on your cardiac medications before your next appointment, please call your pharmacy.    

## 2016-11-14 ENCOUNTER — Telehealth: Payer: Self-pay | Admitting: Cardiology

## 2016-11-14 NOTE — Telephone Encounter (Signed)
Mrs. Hendler is calling to find out what was seen on the test that was done . Please call

## 2016-11-14 NOTE — Telephone Encounter (Signed)
F/u message ° °Pt returning RN call. Please call back to discuss  °

## 2016-11-14 NOTE — Telephone Encounter (Signed)
LMTCB

## 2016-11-14 NOTE — Telephone Encounter (Signed)
Returned the call back to the patient. She was calling to verify the reason she needed the stress test was for a left bundle branch block as seen on her EKG at her appointment yesterday.   Per the provider's note: New ILBBB on ECG; will arrange lexiscan nuclear study to R/O ischmemia.  The patient verbalized her appreciation and understanding.

## 2016-11-19 ENCOUNTER — Telehealth: Payer: Self-pay | Admitting: *Deleted

## 2016-11-19 NOTE — Telephone Encounter (Signed)
Patient is in 04 recall for 10/2016. [facility] is not [facility] for her follow up Mamm[facility]. Please contact patient to schedule Thanks

## 2016-11-19 NOTE — Telephone Encounter (Signed)
I have attempted to contact this patient by phone with the following results: left message to return call to [place] at [phone] on answering machine (mobile).  No personal information given per DPR. [phone] (Mobile) *Preferred*

## 2016-11-20 ENCOUNTER — Telehealth (HOSPITAL_COMMUNITY): Payer: Self-pay

## 2016-11-20 NOTE — Telephone Encounter (Signed)
Encounter complete. 

## 2016-11-21 NOTE — Telephone Encounter (Signed)
I spoke with the patient to remind her it is time to schedule her follow up mammogram. She is appreciative of reminders, both from our office and [facility]. She states she has a lot of "cardiac stuff" going on right now and would prefer to take care of one issue at a time. She will [facility] to schedule [facility] once she has finished cardiac workup.

## 2016-11-22 ENCOUNTER — Ambulatory Visit (HOSPITAL_COMMUNITY)
Admission: RE | Admit: 2016-11-22 | Discharge: 2016-11-22 | Disposition: A | Discharge status: Home / Self Care | Payer: Managed Care, Other (non HMO) | Source: Ambulatory Visit | Attending: Cardiovascular Disease | Admitting: Cardiovascular Disease

## 2016-11-22 ENCOUNTER — Encounter (HOSPITAL_COMMUNITY): Payer: Self-pay | Admitting: *Deleted

## 2016-11-22 DIAGNOSIS — R079 Chest pain, unspecified: Secondary | ICD-10-CM | POA: Insufficient documentation

## 2016-11-22 DIAGNOSIS — R002 Palpitations: Secondary | ICD-10-CM | POA: Insufficient documentation

## 2016-11-22 DIAGNOSIS — I2510 Atherosclerotic heart disease of native coronary artery without angina pectoris: Secondary | ICD-10-CM | POA: Diagnosis present

## 2016-11-22 DIAGNOSIS — I447 Left bundle-branch block, unspecified: Secondary | ICD-10-CM | POA: Insufficient documentation

## 2016-11-22 DIAGNOSIS — R0609 Other forms of dyspnea: Secondary | ICD-10-CM | POA: Diagnosis not present

## 2016-11-22 LAB — MYOCARDIAL PERFUSION IMAGING
CHL CUP NUCLEAR SSS: 8
LV dias vol: 71 mL (ref 46–106)
LVSYSVOL: 28 mL
NUC STRESS TID: 1.29
Peak HR: 127 {beats}/min
Rest HR: 75 {beats}/min
SDS: 0
SRS: 8

## 2016-11-22 MED ORDER — TECHNETIUM TC 99M TETROFOSMIN IV KIT
10.2000 | PACK | Freq: Once | INTRAVENOUS | Status: AC | PRN
  Administered 2016-11-22: 10.2 via INTRAVENOUS
  Filled 2016-11-22: qty 11

## 2016-11-22 MED ORDER — AMINOPHYLLINE 25 MG/ML IV SOLN
75.0000 mg | Freq: Once | INTRAVENOUS | Status: AC
  Administered 2016-11-22: 75 mg via INTRAVENOUS

## 2016-11-22 MED ORDER — TECHNETIUM TC 99M TETROFOSMIN IV KIT
31.7000 | PACK | Freq: Once | INTRAVENOUS | Status: AC | PRN
  Administered 2016-11-22: 31.7 via INTRAVENOUS
  Filled 2016-11-22: qty 32

## 2016-11-22 MED ORDER — REGADENOSON 0.4 MG/5ML IV SOLN
0.4000 mg | Freq: Once | INTRAVENOUS | Status: AC
  Administered 2016-11-22: 0.4 mg via INTRAVENOUS

## 2016-11-22 NOTE — Progress Notes (Signed)
Dr Oval Linsey reviewed Myoview study. Ok to d/c pt home

## 2016-11-25 ENCOUNTER — Encounter: Payer: Self-pay | Admitting: *Deleted

## 2016-11-25 ENCOUNTER — Telehealth: Payer: Self-pay | Admitting: Cardiology

## 2016-11-25 NOTE — Telephone Encounter (Signed)
Please see message below and advise on recall status Thanks   

## 2016-11-25 NOTE — Telephone Encounter (Signed)
Returned call to patient, patient reports when she had stress test on Friday and was told her LBBB increased during stress or exercise.  Patient reports she is continued to have intermittent CP and is concerned as she has upcoming trip to [place].   Advised per results her stress test was read normal and low risk.   Advised I am unaware of what happened during the stress test and unfortunately unable to give advice on this as I am only able to relay the readings and review by Dr. Stanford Breed.   Patient reports she is concerned because she still has not felt well and would like to be seen this week prior to flying out of town.      No availabilities at [facility], scheduled for 7/24 at 2pm with Melina Copa PA at [facility].  Patient aware and verbalized understanding.

## 2016-11-25 NOTE — Telephone Encounter (Signed)
Please send her a letter recommending the follow up just so she has a written reminder at home.

## 2016-11-25 NOTE — Telephone Encounter (Signed)
Letter sent. Removed from recall -eh  

## 2016-11-25 NOTE — Telephone Encounter (Signed)
Patient requesting a call back, states that she would like to " touch base on a few things."

## 2016-11-26 ENCOUNTER — Encounter (INDEPENDENT_AMBULATORY_CARE_PROVIDER_SITE_OTHER): Payer: Self-pay

## 2016-11-26 ENCOUNTER — Encounter: Payer: Self-pay | Admitting: Physician Assistant

## 2016-11-26 ENCOUNTER — Ambulatory Visit (INDEPENDENT_AMBULATORY_CARE_PROVIDER_SITE_OTHER): Payer: Managed Care, Other (non HMO) | Admitting: Physician Assistant

## 2016-11-26 VITALS — BP 130/90 | HR 70 | Ht 61.0 in | Wt 138.8 lb

## 2016-11-26 DIAGNOSIS — I2510 Atherosclerotic heart disease of native coronary artery without angina pectoris: Secondary | ICD-10-CM

## 2016-11-26 DIAGNOSIS — I447 Left bundle-branch block, unspecified: Secondary | ICD-10-CM | POA: Diagnosis not present

## 2016-11-26 DIAGNOSIS — I472 Ventricular tachycardia: Secondary | ICD-10-CM

## 2016-11-26 DIAGNOSIS — I2583 Coronary atherosclerosis due to lipid rich plaque: Secondary | ICD-10-CM

## 2016-11-26 DIAGNOSIS — E785 Hyperlipidemia, unspecified: Secondary | ICD-10-CM

## 2016-11-26 DIAGNOSIS — I4729 Other ventricular tachycardia: Secondary | ICD-10-CM

## 2016-11-26 MED ORDER — AMLODIPINE BESYLATE 2.5 MG PO TABS: 2.5000 mg | ORAL_TABLET | Freq: Every day | ORAL | 3 refills | Status: DC

## 2016-11-26 MED ORDER — NITROGLYCERIN 0.4 MG SL SUBL: 0.4000 mg | SUBLINGUAL_TABLET | SUBLINGUAL | 3 refills | Status: DC | PRN

## 2016-11-26 NOTE — Patient Instructions (Signed)
Medication Instructions:  Your physician has recommended you make the following change in your medication:  1.  START Amlodipine 2.5 mg daily   Labwork: TODAY:  BMET, MAG, CBC, & TSH  Testing/Procedures: Your physician has recommended that you wear an event monitor. Event monitors are medical devices that record the heart's electrical activity. Doctors most often [place] these monitors to diagnose arrhythmias. Arrhythmias are problems with the speed or rhythm of the heartbeat. The monitor is a small, portable device. You can wear one while you do your normal daily activities. This is usually used to diagnose what is causing palpitations/syncope (passing out).    Follow-Up: Your physician recommends that you schedule a follow-up appointment in: 2 [place] OFF WITH DR. CRENSHAW OR HIS CARETEAM   Any Other Special Instructions Will Be Listed Below (If Applicable).   Cardiac Event Monitoring A cardiac event monitor is a small recording device that is used to detect abnormal heart rhythms (arrhythmias). The monitor is used to record your heart rhythm when you have symptoms, such as:  Fast heartbeats (palpitations), such as heart racing or fluttering.  Dizziness.  Fainting or light-headedness.  Unexplained weakness.  Some monitors are wired to electrodes placed on your chest. Electrodes are flat, sticky disks that attach to your skin. Other monitors may be hand-held or worn on the wrist. The monitor can be worn for up to 30 days. If the monitor is attached to your chest, a technician will prepare your chest for the electrode placement and show you how to work the monitor. Take time to practice using the monitor before you leave the office. Make sure you understand how to send the information from the monitor to your health care provider. In some cases, you may need to use a landline telephone instead of a cell phone. What are the risks? Generally, this device is safe  to use, but it possible that the skin under the electrodes will become irritated. How to use your cardiac event monitor  Wear your monitor at all times, except when you are in water: ? Do not let the monitor get wet. ? Take the monitor off when you bathe. Do not swim or use a hot tub with it on.  Keep your skin clean. Do not put body lotion or moisturizer on your chest.  Change the electrodes as told by your health care provider or any time they stop sticking to your skin. You may need to use medical tape to keep them on.  Try to put the electrodes in slightly different places on your chest to help prevent skin irritation. They must remain in the area under your left breast and in the upper right section of your chest.  Make sure the monitor is safely clipped to your clothing or in a location close to your body that your health care provider recommends.  Press the button to record as soon as you feel heart-related symptoms, such as: ? Dizziness. ? Weakness. ? Light-headedness. ? Palpitations. ? Thumping or pounding in your chest. ? Shortness of breath. ? Unexplained weakness.  Keep a diary of your activities, such as walking, doing chores, and taking medicine. It is very important to note what you were doing when you pushed the button to record your symptoms. This will help your health care provider determine what might be contributing to your symptoms.  Send the recorded information as recommended by your health care provider. It may take some time for your health  care provider to process the results.  Change the batteries as told by your health care provider.  Keep electronic devices away from your monitor. This includes: ? Tablets. ? MP3 players. ? Cell phones.  While wearing your monitor you should avoid: ? Electric blankets. ? Armed forces operational officer. ? Electric toothbrushes. ? Microwave ovens. ? Magnets. ? Metal detectors. Get help right away if:  You have chest pain.  You  have extreme difficulty breathing or shortness of breath.  You develop a very fast heartbeat that persists.  You develop dizziness that does not go away.  You faint or constantly feel like you are about to faint. Summary  A cardiac event monitor is a small recording device that is used to help detect abnormal heart rhythms (arrhythmias).  The monitor is used to record your heart rhythm when you have heart-related symptoms.  Make sure you understand how to send the information from the monitor to your health care provider.  It is important to press the button on the monitor when you have any heart-related symptoms.  Keep a diary of your activities, such as walking, doing chores, and taking medicine. It is very important to note what you were doing when you pushed the button to record your symptoms. This will help your health care provider learn what might be causing your symptoms. This information is not intended to replace advice given to you by your health care provider. Make sure you discuss any questions you have with your health care provider. Document Released: 01/30/2008 Document Revised: 04/06/2016 Document Reviewed: 04/06/2016 Elsevier Interactive Patient Education  2017 [employer].    If you need a refill on your cardiac medications before your next appointment, please call your pharmacy.

## 2016-11-26 NOTE — Progress Notes (Addendum)
Cardiology Office Note    Date:  11/26/2016  ID:  Angelica, [name] March 18, 1956, MRN [id number] PCP:  Chesley Noon, MD  Cardiologist:  Dr. Stanford Breed   Chief Complaint: f/u chest pain  History of Present Illness:  Angelica [name] is a 61 y.o. female with history of CAD s/p LAD stent 2012, NSVT, HLD, reactive airway disease, recently diagnosed incomplete LBBB/rate related progression who presents back for f/u of chest pain. She has remote history of persistent atypical shoulder pain with abnormal stress echo with nonsustained ventricular tachycardia and ischemia in the anteroseptal wall and apex -> underwent cardiac catheterization in September 2012 which revealed a 95% ostial LAD lesion, treated with DES. There was some jailing of the circumflex. Repeat cath at [facility] in Dec 2012 because of palpitations and recurrent chest/shoulder pain revealed patency of LAD stent and she had no other significant CAD. She had an event monitor placed in Jan 2013 that showed sinus rhythm. Carotid Dopplers December 2012 showed 0-39% bilateral stenosis. 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 There was no ischemia. She has continued to have frequent chest pain per Dr. Jacalyn Lefevre note. She had caths in 09/2012 and 12/2013 not requiring intervention - last cath in 12/2013 showed patent LAD stent with stable moderate mid LAD 40-50% lesion, 30% Cx, normal EF and normal LVEDP. Nuclear study September 2016 showed ejection fraction 61%. There was inferior septal thinning but no ischemia. She was admitted in [place] in August 2017 with chest pain and ruled out with a reportedly negative nuclear study. When seen in the office recently by Dr. Stanford Breed she had noted exertional DOE and occasional shoulder pain under stressful situations. EKG showed NSR with new incomplete LBBB. Repeat nuc was performed 11/22/16 which showed normal perfusion, EF 61%, low risk study. She  did appear to go into a rate-related LBBB at higher HR which resolved in lower HR. No pertinent labs in system since 2015, at which time LDL was 55, BMET/LFTS/TSH wnl, Hgb normal.  She returns for follow-up of the above. She reports that she has had this right sided shoulder pain for several years, even at time of the repeat caths. It is definitely associated with stress. It is not exertional. Occasionally she will feel a "ball" in her chest under stressful situations. Sometimes when it happens she feels SOB. Reports she is under significant stress all the time - works a high stress job, 80 hours a week, 300 emails a day. Recently hired an Environmental consultant and plans to eventually start cutting back by age 83. Pain is not worse with inspiration, palpation. Also reports infrequent butterflies sensation - brief palpitations either like a skip, but have also noticed a few beats in a row in the past as well. This makes her have to take a deep breath for a second. No syncope.    Past Medical History:  Diagnosis Date  . ALLERGIC RHINITIS 03/02/2007  . BREAST CYST, LEFT 11/23/2009  . CAD S/P percutaneous coronary angioplasty 01/2011   a) Exertional Back Pain & High RISK ST) - 95% Ostial LAD --> PCI with Promus DES 3.0 mm x 12 mm; b) LHC 08/2012: LAD stent Patent , 50% mid LAD; c) Neg Myoview 03/2012 d). LHC 12/09/2013 40-50% mid-LAD no progression, medical therapy. e) Neg nuc 2016, 2018.  Marland Kitchen HLD (hyperlipidemia)   . Incomplete left bundle branch block   . REACTIVE AIRWAY DISEASE 10/21/2008    Past Surgical History:  Procedure Laterality Date  . AUGMENTATION MAMMAPLASTY Bilateral 1990  . BREAST BIOPSY    . BREAST SURGERY     BILAT IMPLANTS  . LEFT HEART CATHETERIZATION WITH CORONARY ANGIOGRAM N/A 09/10/2012   Procedure: LEFT HEART CATHETERIZATION WITH CORONARY ANGIOGRAM;  Surgeon: Sherren Mocha, MD;  [facility] C[facility] LAB;  Service: Cardiovascular;  Laterality: N/A;  . LEFT HEART CATHETERIZATION WITH CORONARY  ANGIOGRAM N/A 12/09/2013   Procedure: LEFT HEART CATHETERIZATION WITH CORONARY ANGIOGRAM;  Surgeon: Leonie Man, MD;  Location: [facility] CATH LAB;  Service: Cardiovascular;  Laterality: N/A;  . PERCUTANEOUS CORONARY STENT INTERVENTION (PCI-S)  01/2011   Promus Premier DES 3.0 mm x12 mm Ostial LAD (extends into LM with partial "jailing" of Cx)    Current Medications: Current Meds  Medication Sig  . aspirin EC 81 MG tablet Take 81 mg by mouth every evening.  Marland Kitchen ibuprofen (ADVIL,MOTRIN) 200 MG tablet Take 200-400 mg by mouth every 6 (six) hours as needed for pain or headache.  . nitroGLYCERIN (NITROSTAT) 0.4 MG SL tablet Place 1 tablet (0.4 mg total) under the tongue every 5 (five) minutes as needed for chest pain.  . rosuvastatin (CRESTOR) 40 MG tablet TAKE 1 TABLET EVERY NIGHT     Allergies:   Prednisone   Social History   Social History  . Marital status: Married    Spouse name: N/A  . Number of children: N/A  . Years of education: N/A   Occupational History  .  Charity fundraiser   Social History Main Topics  . Smoking status: Never Smoker  . Smokeless tobacco: Never Used  . Alcohol use 0.6 - 1.2 oz/week    1 - 2 Standard drinks or equivalent per week     Comment: Occasional beer  . Drug use: No  . Sexual activity: Yes    Partners: Male    Birth control/ protection: Post-menopausal   Other Topics Concern  . None   Social History Narrative   Occupation:    Never Smoked   Alcohol use- no   Married   Drug use- no   Regular Exercise- yes      Family History:  Family History  Problem Relation Age of Onset  . Alcohol abuse Other   . Diabetes Other   . Hypertension Other   . Heart disease Other     ROS:   Please see the history of present illness.  All other systems are reviewed and otherwise negative.    PHYSICAL EXAM:   VS:  BP 130/90   Pulse 70   Ht 5\' 1"  (1.549 m)   Wt 138 lb 12.8 oz (63 kg)   LMP 02/21/2013   SpO2 97%   BMI  26.23 kg/m   BMI: Body mass index is 26.23 kg/m. GEN: Well nourished, well developed WF, in no acute distress  HEENT: normocephalic, atraumatic Neck: no JVD, carotid bruits, or masses Cardiac: RRR; no murmurs, rubs, or gallops, no edema  Respiratory:  clear to auscultation bilaterally, normal work of breathing GI: soft, nontender, nondistended, + BS MS: no deformity or atrophy  Skin: warm and dry, no rash Neuro:  Alert and Oriented x 3, Strength and sensation are intact, follows commands Psych: euthymic mood, full affect  Wt Readings from Last 3 Encounters:  11/26/16 138 lb 12.8 oz (63 kg)  11/22/16 142 lb (64.4 kg)  11/13/16 142 lb (64.4 kg)      Studies/Labs Reviewed:   EKG:  EKG  was ordered today and personally reviewed by me and demonstrates NSR 70bpm, cannot r/o prior anteroseptal infarct, no acute changes, similar to previous 2017 tracings  Recent Labs: No results found for requested labs within last 8760 hours.   Lipid Panel    Component Value Date/Time   CHOL 145 03/30/2014 0818   TRIG 48.0 03/30/2014 0818   TRIG 67 03/13/2006 1112   HDL 80.10 03/30/2014 0818   CHOLHDL 2 03/30/2014 0818   VLDL 9.6 03/30/2014 0818   LDLCALC 55 03/30/2014 0818   LDLDIRECT 101.2 11/15/2008 0943    Additional studies/ records that were reviewed today include: Summarized above    ASSESSMENT & PLAN:   1. CAD - she has long history of atypical right shoulder pain. It sounds like she had initial improvement the day after PCI in 2012 but it recurred shortly after in the setting of stressful job. Repeat caths since that time for same symptom have been reassuring. Recent nuclear stress test 11/22/16 showed normal perfusion. I'm not sure if the shoulder pain was a red herring and never was cardiac to begin with, or if she could have a component of microvascular angina. Will add amlodipine 2.5mg  daily and follow. Refill NTG as requested (she has not been using this - she does have prior h/o  headache with NTG so I did not choose Imdur). She is not tachycardic, tachypneic or hypoxic. Also discussed importance of long term stress reduction. Should symptoms begin to escalate, may require discussion with Dr. Stanford Breed regarding whether she needs definitive repeat cath. Continue ASA. Will forward note to Dr. Stanford Breed to review. Also discussed avoidance of ibuprofen given CVD. ER precautions reviewed. Addendum: d/w Dr. Stanford Breed who agrees with conservative therapy. 2. Incomplete left bundle branch block - not seen on tracing today. D/w Dr. Burt Knack (DOD) today in Dr. Jacalyn Lefevre absence. In setting of normal perfusion, not felt to be clinically significant at this time. 3. Palpitations - and prior history NSVT - mostly c/w PACs/PVCs but she has had a few episodes which sound like short runs. Will reassess by 30 day event monitor. Check labs. 4. Hyperlipidemia - continue statin. Further monitoring per primary cardiologist.  Disposition: F/u with Dr. Melynda Ripple team APP 2 weeks after monitor.   Medication Adjustments/Labs and Tests Ordered: Current medicines are reviewed at length with the patient today.  Concerns regarding medicines are outlined above. Medication changes, Labs and Tests ordered today are summarized above and listed in the Patient Instructions accessible in Encounters.   Signed, Charlie Pitter, PA-C  11/26/2016 2:35 PM    [facility] [address], [place], [place]  [address] Phone: [phone]; Fax: [phone]

## 2016-11-27 LAB — BASIC METABOLIC PANEL
BUN / CREAT RATIO: 16 (ref 12–28)
BUN: 13 mg/dL (ref 8–27)
CO2: 24 mmol/L (ref 20–29)
Calcium: 9.5 mg/dL (ref 8.7–10.3)
Chloride: 103 mmol/L (ref 96–106)
Creatinine, Ser: 0.82 mg/dL (ref 0.57–1.00)
GFR, EST AFRICAN AMERICAN: 90 mL/min/{1.73_m2} (ref >59)
GFR, EST NON AFRICAN AMERICAN: 78 mL/min/{1.73_m2} (ref >59)
Glucose: 85 mg/dL (ref 65–99)
POTASSIUM: 4.5 mmol/L (ref 3.5–5.2)
SODIUM: 143 mmol/L (ref 134–144)

## 2016-11-27 LAB — CBC
Hematocrit: 41.8 % (ref 34.0–46.6)
Hemoglobin: 14.6 g/dL (ref 11.1–15.9)
MCH: 34 pg — AB (ref 26.6–33.0)
MCHC: 34.9 g/dL (ref 31.5–35.7)
MCV: 97 fL (ref 79–97)
PLATELETS: 345 10*3/uL (ref 150–379)
RBC: 4.29 x10E6/uL (ref 3.77–5.28)
RDW: 13 % (ref 12.3–15.4)
WBC: 8.2 10*3/uL (ref 3.4–10.8)

## 2016-11-27 LAB — MAGNESIUM: Magnesium: 2.2 mg/dL (ref 1.6–2.3)

## 2016-11-27 LAB — TSH: TSH: 1.04 u[IU]/mL (ref 0.450–4.500)

## 2016-11-29 ENCOUNTER — Telehealth: Payer: Self-pay | Admitting: Physician Assistant

## 2016-11-29 NOTE — Telephone Encounter (Signed)
PT AWARE OF LAB RESULTS./CY 

## 2016-11-29 NOTE — Telephone Encounter (Signed)
New message    Pt is returning call to nurse about results. She is going into a meeting for the next 30 minutes, she asked to be called after 930.

## 2016-12-03 ENCOUNTER — Encounter: Payer: Self-pay | Admitting: *Deleted

## 2016-12-10 ENCOUNTER — Other Ambulatory Visit: Payer: Self-pay | Admitting: Physician Assistant

## 2016-12-10 ENCOUNTER — Ambulatory Visit (INDEPENDENT_AMBULATORY_CARE_PROVIDER_SITE_OTHER): Payer: Managed Care, Other (non HMO)

## 2016-12-10 DIAGNOSIS — E785 Hyperlipidemia, unspecified: Secondary | ICD-10-CM | POA: Diagnosis not present

## 2016-12-10 DIAGNOSIS — R002 Palpitations: Secondary | ICD-10-CM

## 2016-12-10 DIAGNOSIS — I472 Ventricular tachycardia: Secondary | ICD-10-CM | POA: Diagnosis not present

## 2016-12-10 DIAGNOSIS — I4729 Other ventricular tachycardia: Secondary | ICD-10-CM

## 2016-12-10 DIAGNOSIS — I2510 Atherosclerotic heart disease of native coronary artery without angina pectoris: Secondary | ICD-10-CM

## 2016-12-10 DIAGNOSIS — I2583 Coronary atherosclerosis due to lipid rich plaque: Secondary | ICD-10-CM | POA: Diagnosis not present

## 2016-12-10 DIAGNOSIS — I447 Left bundle-branch block, unspecified: Secondary | ICD-10-CM

## 2016-12-24 ENCOUNTER — Telehealth: Payer: Self-pay | Admitting: Physician Assistant

## 2016-12-24 NOTE — Telephone Encounter (Signed)
Patient states that she has to wear a monitor for 30 days but she will have to catch her flight tomorrow morning and also Friday. Ms. Bonafede feels like it might be a hassle getting through the airport with monitor, so she would like to know if she could take it off?    Patient asked that you could call her back after 1 pm today or call at 3:30pm as she will be in meetings. Thanks.

## 2016-12-25 NOTE — Telephone Encounter (Signed)
Patient instructed to let [employer] know when she will be taking off her monitor and when she expects to reapply.  Patient inquired about extending monitor to 01/14/17, she has a very stressful meeting.  Unfortunately, a cardiac event monitor is for 30 days max unless medically necessary.  However and extensions past 30 days service would need authorization from insurance.

## 2017-01-03 ENCOUNTER — Other Ambulatory Visit: Payer: Self-pay | Admitting: Family Medicine

## 2017-01-03 DIAGNOSIS — N6489 Other specified disorders of breast: Secondary | ICD-10-CM

## 2017-01-27 ENCOUNTER — Ambulatory Visit: Payer: Managed Care, Other (non HMO) | Admitting: Student

## 2017-02-03 ENCOUNTER — Ambulatory Visit
Admission: RE | Admit: 2017-02-03 | Discharge: 2017-02-03 | Disposition: A | Discharge status: Home / Self Care | Payer: Managed Care, Other (non HMO) | Source: Ambulatory Visit | Attending: Family Medicine | Admitting: Family Medicine

## 2017-02-03 ENCOUNTER — Ambulatory Visit (INDEPENDENT_AMBULATORY_CARE_PROVIDER_SITE_OTHER): Payer: Managed Care, Other (non HMO) | Admitting: Physician Assistant

## 2017-02-03 ENCOUNTER — Encounter: Payer: Self-pay | Admitting: Physician Assistant

## 2017-02-03 VITALS — BP 130/80 | HR 76 | Ht 64.0 in | Wt 144.6 lb

## 2017-02-03 DIAGNOSIS — R079 Chest pain, unspecified: Secondary | ICD-10-CM

## 2017-02-03 DIAGNOSIS — I2510 Atherosclerotic heart disease of native coronary artery without angina pectoris: Secondary | ICD-10-CM | POA: Diagnosis not present

## 2017-02-03 DIAGNOSIS — N6489 Other specified disorders of breast: Secondary | ICD-10-CM

## 2017-02-03 DIAGNOSIS — R002 Palpitations: Secondary | ICD-10-CM

## 2017-02-03 DIAGNOSIS — E785 Hyperlipidemia, unspecified: Secondary | ICD-10-CM

## 2017-02-03 NOTE — Patient Instructions (Signed)
Medication Instructions:   No changes made today - continue current regimen.  Labwork:   none  Testing/Procedures:  none  Follow-Up:  With Dr. Stanford Breed in 3 months  If you need a refill on your cardiac medications before your next appointment, please call your pharmacy.

## 2017-02-03 NOTE — Progress Notes (Signed)
Cardiology Office Note    Date:  02/05/2017   ID:  [name], [name] [birth year], MRN [id number]  PCP:  Chesley Noon, MD  Cardiologist: Dr. Stanford Breed   Chief Complaint  Patient presents with  . Follow-up    pt c/o side effects from Amlodipine     History of Present Illness:  Angelica [name] is a 61 y.o. female with PMH of CAD s/p LAD stent 2012, NSVT, HLD, reactive airway disease, incomplete LBBB. She had a remote history of persistent atypical shoulder pain. Stress echo was obtained later was abnormal with nonsustained ventricular tachycardia and ischemia in the anteroseptal wall and apex. She underwent cardiac catheterization in September 2012 which revealed a 95% ostial LAD lesion treated with DES. There was some jailing of the left circumflex. Repeat cath at [facility] in December 2012 due to palpitation recurrent chest pain/shoulder pain revealed patent LAD stent and no other significant coronary artery disease. She had event monitor placed in January 2013 that shows sinus rhythm. Carotid Dopplers in December 2012 showed mild bilateral disease. 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 She had cardiac cath in May 2014 and August 2015, no intervention was performed. Last cardiac cath in August 2015 showed patent LAD stent with stable moderate mid LAD 40-50% lesion, 30% left circumflex, normal EF, normal LVEDP. Myoview in September 2016 showed EF 61%, inferior septal thickening but no ischemia. She was admitted in [place] in August 2017 with chest pain and ruled out was reported negative Myoview. Recently, EKG showed new incomplete left bundle branch block, repeat Myoview performed on 11/22/2016 showed normal perfusion, EF 61%, low risk study.  She was recently seen by Melina Copa PA-C in the office on 11/26/2016. She was still have chest discomfort or palpitations time. It was felt she may have some microvascular  disease, Imdur 2.5 mg daily was added. The case was reviewed with Dr. Stanford Breed who recommended conservative therapy in light of recent negative Myoview. A 30 day event monitor was also recommended for her. Monitor showed sinus rhythm with PACs, brief PAT and 6 beats nonsustained VT. No change in medical therapy was recommended.  She presents today along with her husband for review of monitor results. We reviewed all of her self triggered episodes. Although there are cases where she has a PAT and PACs, a lot of the times when she triggered the heart monitor, the underlying rhythm is actually normal sinus rhythm. She continued to have occasional chest discomfort. Given negative Myoview, conservative management is recommended. She has no lower extremity edema, orthopnea or PND.   Past Medical History:  Diagnosis Date  . ALLERGIC RHINITIS 03/02/2007  . BREAST CYST, LEFT 11/23/2009  . CAD S/P percutaneous coronary angioplasty 01/2011   a) Exertional Back Pain & High RISK ST) - 95% Ostial LAD --> PCI with Promus DES 3.0 mm x 12 mm; b) LHC 08/2012: LAD stent Patent , 50% mid LAD; c) Neg Myoview 03/2012 d). LHC 12/09/2013 40-50% mid-LAD no progression, medical therapy. e) Neg nuc 2016, 2018.  Marland Kitchen HLD (hyperlipidemia)   . Incomplete left bundle branch block   . REACTIVE AIRWAY DISEASE 10/21/2008    Past Surgical History:  Procedure Laterality Date  . AUGMENTATION MAMMAPLASTY Bilateral 1990  . BREAST BIOPSY    . BREAST SURGERY     BILAT IMPLANTS  . LEFT HEART CATHETERIZATION WITH CORONARY ANGIOGRAM N/A 09/10/2012   Procedure: LEFT HEART CATHETERIZATION WITH CORONARY  Cyril Loosen;  Surgeon: Sherren Mocha, MD;  Location: [facility] CATH LAB;  Service: Cardiovascular;  Laterality: N/A;  . LEFT HEART CATHETERIZATION WITH CORONARY ANGIOGRAM N/A 12/09/2013   Procedure: LEFT HEART CATHETERIZATION WITH CORONARY ANGIOGRAM;  Surgeon: Leonie Man, MD;  Location: [facility] CATH LAB;  Service: Cardiovascular;  Laterality: N/A;  .  PERCUTANEOUS CORONARY STENT INTERVENTION (PCI-S)  01/2011   Promus Premier DES 3.0 mm x12 mm Ostial LAD (extends into LM with partial "jailing" of Cx)    Current Medications: Outpatient Medications Prior to Visit  Medication Sig Dispense Refill  . amLODipine (NORVASC) 2.5 MG tablet Take 1 tablet (2.5 mg total) by mouth daily. 180 tablet 3  . aspirin EC 81 MG tablet Take 81 mg by mouth every evening.    Marland Kitchen ibuprofen (ADVIL,MOTRIN) 200 MG tablet Take 200-400 mg by mouth every 6 (six) hours as needed for pain or headache.    . nitroGLYCERIN (NITROSTAT) 0.4 MG SL tablet Place 1 tablet (0.4 mg total) under the tongue every 5 (five) minutes as needed for chest pain. 25 tablet 3  . rosuvastatin (CRESTOR) 40 MG tablet TAKE 1 TABLET EVERY NIGHT 90 tablet 3   No facility-administered medications prior to visit.      Allergies:   Prednisone   Social History   Social History  . Marital status: Married    Spouse name: N/A  . Number of children: N/A  . Years of education: N/A   Occupational History  .  Charity fundraiser   Social History Main Topics  . Smoking status: Never Smoker  . Smokeless tobacco: Never Used  . Alcohol use 0.6 - 1.2 oz/week    1 - 2 Standard drinks or equivalent per week     Comment: Occasional beer  . Drug use: No  . Sexual activity: Yes    Partners: Male    Birth control/ protection: Post-menopausal   Other Topics Concern  . None   Social History Narrative   Occupation:    Never Smoked   Alcohol use- no   Married   Drug use- no   Regular Exercise- yes      Family History:  The patient's family history includes Alcohol abuse in her other; Diabetes in her other; Heart disease in her other; Hypertension in her other.   ROS:   Please see the history of present illness.    ROS All other systems reviewed and are negative.   PHYSICAL EXAM:   VS:  BP 130/80   Pulse 76   Ht 5\' 4"  (1.626 m)   Wt 144 lb 9.6 oz (65.6 kg)   LMP  02/21/2013   BMI 24.82 kg/m    GEN: Well nourished, well developed, in no acute distress  HEENT: normal  Neck: no JVD, carotid bruits, or masses Cardiac: RRR; no murmurs, rubs, or gallops,no edema  Respiratory:  clear to auscultation bilaterally, normal work of breathing GI: soft, nontender, nondistended, + BS MS: no deformity or atrophy  Skin: warm and dry, no rash Neuro:  Alert and Oriented x 3, Strength and sensation are intact Psych: euthymic mood, full affect  Wt Readings from Last 3 Encounters:  02/03/17 144 lb 9.6 oz (65.6 kg)  11/26/16 138 lb 12.8 oz (63 kg)  11/22/16 142 lb (64.4 kg)      Studies/Labs Reviewed:   EKG:  EKG is not ordered today.   Recent Labs: 11/26/2016: BUN 13; Creatinine, Ser 0.82; Hemoglobin 14.6; Magnesium 2.2; Platelets  345; Potassium 4.5; Sodium 143; TSH 1.040   Lipid Panel    Component Value Date/Time   CHOL 145 03/30/2014 0818   TRIG 48.0 03/30/2014 0818   TRIG 67 03/13/2006 1112   HDL 80.10 03/30/2014 0818   CHOLHDL 2 03/30/2014 0818   VLDL 9.6 03/30/2014 0818   LDLCALC 55 03/30/2014 0818   LDLDIRECT 101.2 11/15/2008 0943    Additional studies/ records that were reviewed today include:   Cath 01/16/2011   Myoview 11/22/2016 Study Highlights     The left ventricular ejection fraction is normal (55-65%).  Nuclear stress EF: 61%.  There was no ST segment deviation noted during stress.  The study is normal.  This is a low risk study.   Normal pharmacologic nuclear stress test with no evidence for prior infarct or ischemia     ASSESSMENT:    1. Chest pain, unspecified type   2. Palpitation   3. Coronary artery disease involving native coronary artery of native heart without angina pectoris   4. Hyperlipidemia, unspecified hyperlipidemia type      PLAN:  In order of problems listed above:  1. Chest pain: Recent negative Myoview, conservative management only for now. Last cardiac catheterization December 2012  showed patent LAD stent, minimal disease otherwise.  2. Palpitation: She continued to have intermittent Palpitation, recent event monitor showed PAT and PACs, several herself triggered event showed normal sinus rhythm. I did offer her a low-dose beta blocker, however she does not want to consider this for now as she previously had significant fatigue on beta blocker.  3. CAD s/p LAD stent in 2012: Last cath in December 2012 showed patent stent and a minimal disease otherwise.  4. Hyperlipidemia: Continue on Crestor 40 mg daily    Medication Adjustments/Labs and Tests Ordered: Current medicines are reviewed at length with the patient today.  Concerns regarding medicines are outlined above.  Medication changes, Labs and Tests ordered today are listed in the Patient Instructions below. Patient Instructions  Medication Instructions:   No changes made today - continue current regimen.  Labwork:   none  Testing/Procedures:  none  Follow-Up:  With Dr. Stanford Breed in 3 months  If you need a refill on your cardiac medications before your next appointment, please call your pharmacy.      Hilbert Corrigan, [place]  02/05/2017 1:46 AM    [address], [place], [place]  [address] Phone: [phone]; Fax: [phone]

## 2017-02-05 ENCOUNTER — Encounter: Payer: Self-pay | Admitting: Physician Assistant

## 2017-02-21 ENCOUNTER — Ambulatory Visit
Admission: RE | Admit: 2017-02-21 | Discharge: 2017-02-21 | Disposition: A | Discharge status: Home / Self Care | Payer: Managed Care, Other (non HMO) | Source: Ambulatory Visit | Attending: Family Medicine | Admitting: Family Medicine

## 2017-04-04 NOTE — Progress Notes (Signed)
61 y.o. G85P0000 Married Caucasian female here for annual exam.    No vaginal bleeding or spotting.  Clear vaginal discharge with no odor, itching or burning.   ROS positive for: loss of sexual interest due to fatigue, frequent urination and night time urination which is controllable per patient, chest pain and palpitations daily since she had her stent.  This is not a new symptom and cardiology aware. New dx of LBBB. Situational stress at work and home.  ROS negative for: constitutional, breast, endocrine, HEENT, GI, hematologic, musculoskeletal, neurological, and skin.  Labs with Dr. Melford Aase.  PCP:   Dr. Melford Aase   Patient's last menstrual period was 02/21/2013.           Sexually active: Yes.    The current method of family planning is post menopausal status.    Exercising: No.  The patient does not participate in regular exercise at present. Smoker:  no  Health Maintenance: Pap: 02-21-14 Neg:Neg HR HPV, 10-05-11 Neg History of abnormal Pap:  no MMG: 02-21-17 3D Diag.Bil.w/implants/Density C/postsurgical scarring Lt.Br.accounting for distortion/Neg/screening 1year/BiRads2:TBC Colonoscopy:   12-14-07 normal with Dr.Jacobs;next due 12/2017. BMD:   ?2010   Result  Normal per patient TDaP:  2012 Gardasil:   no HIV: unsure Hep C: 2016 Neg Screening Labs: PCP Hb today: PCP, Urine today: not collected    reports that  has never smoked. she has never used smokeless tobacco. She reports that she drinks about 0.6 - 1.2 oz of alcohol per week. She reports that she does not use drugs.  Past Medical History:  Diagnosis Date  . ALLERGIC RHINITIS 03/02/2007  . BREAST CYST, LEFT 11/23/2009  . CAD S/P percutaneous coronary angioplasty 01/2011   a) Exertional Back Pain & High RISK ST) - 95% Ostial LAD --> PCI with Promus DES 3.0 mm x 12 mm; b) LHC 08/2012: LAD stent Patent , 50% mid LAD; c) Neg Myoview 03/2012 d). LHC 12/09/2013 40-50% mid-LAD no progression, medical therapy. e) Neg nuc 2016, 2018.  Marland Kitchen  HLD (hyperlipidemia)   . Incomplete left bundle branch block   . REACTIVE AIRWAY DISEASE 10/21/2008    Past Surgical History:  Procedure Laterality Date  . AUGMENTATION MAMMAPLASTY Bilateral 1990  . BREAST BIOPSY    . BREAST SURGERY     BILAT IMPLANTS  . LEFT HEART CATHETERIZATION WITH CORONARY ANGIOGRAM N/A 09/10/2012   Procedure: LEFT HEART CATHETERIZATION WITH CORONARY ANGIOGRAM;  Surgeon: Sherren Mocha, MD;  Location: [facility] CATH LAB;  Service: Cardiovascular;  Laterality: N/A;  . LEFT HEART CATHETERIZATION WITH CORONARY ANGIOGRAM N/A 12/09/2013   Procedure: LEFT HEART CATHETERIZATION WITH CORONARY ANGIOGRAM;  Surgeon: Leonie Man, MD;  Location: [facility] CATH LAB;  Service: Cardiovascular;  Laterality: N/A;  . PERCUTANEOUS CORONARY STENT INTERVENTION (PCI-S)  01/2011   Promus Premier DES 3.0 mm x12 mm Ostial LAD (extends into LM with partial "jailing" of Cx)    Current Outpatient Medications  Medication Sig Dispense Refill  . aspirin EC 81 MG tablet Take 81 mg by mouth every evening.    . Coenzyme Q10 (CO Q 10 PO) Take by mouth.    Marland Kitchen ibuprofen (ADVIL,MOTRIN) 200 MG tablet Take 200-400 mg by mouth every 6 (six) hours as needed for pain or headache.    . nitroGLYCERIN (NITROSTAT) 0.4 MG SL tablet Place 1 tablet (0.4 mg total) under the tongue every 5 (five) minutes as needed for chest pain. 25 tablet 3  . rosuvastatin (CRESTOR) 40 MG tablet TAKE 1 TABLET EVERY NIGHT 90  tablet 3  . amLODipine (NORVASC) 2.5 MG tablet Take 1 tablet (2.5 mg total) by mouth daily. 180 tablet 3   No current facility-administered medications for this visit.     Family History  Problem Relation Age of Onset  . Alcohol abuse Other   . Diabetes Other   . Hypertension Other   . Heart disease Other     ROS:  Pertinent items are noted in HPI.  Otherwise, a comprehensive ROS was negative.  Exam:   BP 120/74 (BP Location: Left Arm, Patient Position: Sitting, Cuff Size: Normal)   Pulse 68   Ht 5\' 2"  (1.575 m)    Wt 144 lb (65.3 kg)   LMP 02/21/2013   HC 14" (35.6 cm)   BMI 26.34 kg/m     General appearance: alert, cooperative and appears stated age Head: Normocephalic, without obvious abnormality, atraumatic Neck: no adenopathy, supple, symmetrical, trachea midline and thyroid normal to inspection and palpation Lungs: clear to auscultation bilaterally Breasts: Bilateral implants, no masses or tenderness, No nipple retraction or dimpling, No nipple discharge or bleeding, No axillary or supraclavicular adenopathy Heart: regular rate and rhythm Abdomen: soft, non-tender; no masses, no organomegaly Extremities: extremities normal, atraumatic, no cyanosis or edema Skin: Skin color, texture, turgor normal. No rashes or lesions Lymph nodes: Cervical, supraclavicular, and axillary nodes normal. No abnormal inguinal nodes palpated Neurologic: Grossly normal  Pelvic: External genitalia:  no lesions              Urethra:  normal appearing urethra with no masses, tenderness or lesions              Bartholins and Skenes: normal                 Vagina: normal appearing vagina with normal color and discharge, no lesions              Cervix: no lesions              Pap taken: Yes.   Bimanual Exam:  Uterus:  normal size, contour, position, consistency, mobility, non-tender              Adnexa: no mass, fullness, tenderness              Rectal exam: Yes.  .  Confirms.              Anus:  normal sphincter tone, no lesions  Chaperone was present for exam.  Assessment:   Well woman visit with normal exam. Bilateral breast implants. Hx CAD.  LBBB. Situation stress.   Plan: Mammogram screening discussed. Recommended self breast awareness. Pap and HR HPV as above. Guidelines for Calcium, Vitamin D, regular exercise program including cardiovascular and weight bearing exercise. We talked about stress reduction methods.  I gave her a card for counselor Marya Amsler with [facility].  Follow up annually and prn.    After visit summary provided.

## 2017-04-07 ENCOUNTER — Encounter: Payer: Self-pay | Admitting: Obstetrics and Gynecology

## 2017-04-07 ENCOUNTER — Ambulatory Visit (INDEPENDENT_AMBULATORY_CARE_PROVIDER_SITE_OTHER): Payer: Managed Care, Other (non HMO) | Admitting: Obstetrics and Gynecology

## 2017-04-07 ENCOUNTER — Other Ambulatory Visit: Payer: Self-pay

## 2017-04-07 ENCOUNTER — Other Ambulatory Visit (HOSPITAL_COMMUNITY)
Admission: RE | Admit: 2017-04-07 | Discharge: 2017-04-07 | Disposition: A | Discharge status: Home / Self Care | Payer: Managed Care, Other (non HMO) | Source: Ambulatory Visit | Attending: Obstetrics and Gynecology | Admitting: Obstetrics and Gynecology

## 2017-04-07 VITALS — BP 120/74 | HR 68 | Ht 62.0 in | Wt 144.0 lb

## 2017-04-07 DIAGNOSIS — Z01419 Encounter for gynecological examination (general) (routine) without abnormal findings: Secondary | ICD-10-CM

## 2017-04-07 NOTE — Patient Instructions (Signed)

## 2017-04-09 LAB — CYTOLOGY - PAP
Diagnosis: NEGATIVE
HPV: NOT DETECTED

## 2017-04-30 DIAGNOSIS — I447 Left bundle-branch block, unspecified: Secondary | ICD-10-CM | POA: Insufficient documentation

## 2017-04-30 DIAGNOSIS — E785 Hyperlipidemia, unspecified: Secondary | ICD-10-CM | POA: Insufficient documentation

## 2017-05-15 NOTE — Progress Notes (Signed)
HPI: FU coronary disease. Previously seen for chest pain. Stress echo was markedly abnormal with nonsustained ventricular tachycardia and ischemia in the anteroseptal wall and apex. Patient underwent cardiac catheterization in September of 2012 which revealed a 95% ostial LAD lesion. The patient had PCI with a drug-eluting stent. There was some jailing of the circumflex. Repeat cath at [facility] in Dec 2012 because of palpitations and chest pain revealed patency of LAD stent and she had no other significant CAD. Carotid Dopplers December 2012 showed 0-39% bilateral stenosis. 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 Patient has continued to have chest pain. She had repeat catheterization in May of 2014 which revealed normal left main, patent LAD stent, 50% mid LAD. There was a 30-40% ostial circumflex. Ejection fraction was 55%. Again had repeat cath 8/15 that showed no change (nonobstructive CAD). Nuclear study 7/18 showed EF 61 with no ischemia or infarction. Monitor 8/18 showed sinus with pacs, brief PAT and 6 beats NSVT. Since last seen  patient continues to have occasional palpitations described as a flutter. No syncope. She denies dyspnea. She occasionally has right shoulder pain which is unchanged and chronic.  Current Outpatient Medications  Medication Sig Dispense Refill  . amLODipine (NORVASC) 2.5 MG tablet Take 2.5 mg by mouth daily.    Marland Kitchen aspirin EC 81 MG tablet Take 81 mg by mouth every evening.    . Coenzyme Q10 (CO Q 10 PO) Take by mouth.    Marland Kitchen ibuprofen (ADVIL,MOTRIN) 200 MG tablet Take 200-400 mg by mouth every 6 (six) hours as needed for pain or headache.    . nitroGLYCERIN (NITROSTAT) 0.4 MG SL tablet Place 1 tablet (0.4 mg total) under the tongue every 5 (five) minutes as needed for chest pain. 25 tablet 3  . rosuvastatin (CRESTOR) 40 MG tablet TAKE 1 TABLET EVERY NIGHT 90 tablet 3   No current facility-administered medications  for this visit.      Past Medical History:  Diagnosis Date  . ALLERGIC RHINITIS 03/02/2007  . BREAST CYST, LEFT 11/23/2009  . CAD S/P percutaneous coronary angioplasty 01/2011   a) Exertional Back Pain & High RISK ST) - 95% Ostial LAD --> PCI with Promus DES 3.0 mm x 12 mm; b) LHC 08/2012: LAD stent Patent , 50% mid LAD; c) Neg Myoview 03/2012 d). LHC 12/09/2013 40-50% mid-LAD no progression, medical therapy. e) Neg nuc 2016, 2018.  Marland Kitchen HLD (hyperlipidemia)   . Incomplete left bundle branch block   . REACTIVE AIRWAY DISEASE 10/21/2008    Past Surgical History:  Procedure Laterality Date  . AUGMENTATION MAMMAPLASTY Bilateral 1990  . BREAST BIOPSY    . BREAST SURGERY     BILAT IMPLANTS  . LEFT HEART CATHETERIZATION WITH CORONARY ANGIOGRAM N/A 09/10/2012   Procedure: LEFT HEART CATHETERIZATION WITH CORONARY ANGIOGRAM;  Surgeon: Sherren Mocha, MD;  Location: [facility] CATH LAB;  Service: Cardiovascular;  Laterality: N/A;  . LEFT HEART CATHETERIZATION WITH CORONARY ANGIOGRAM N/A 12/09/2013   Procedure: LEFT HEART CATHETERIZATION WITH CORONARY ANGIOGRAM;  Surgeon: Leonie Man, MD;  Location: [facility] CATH LAB;  Service: Cardiovascular;  Laterality: N/A;  . PERCUTANEOUS CORONARY STENT INTERVENTION (PCI-S)  01/2011   Promus Premier DES 3.0 mm x12 mm Ostial LAD (extends into LM with partial "jailing" of Cx)    Social History   Socioeconomic History  . Marital status: Married    Spouse name: Not on file  . Number of children: Not on  file  . Years of education: Not on file  . Highest education level: Not on file  Social Needs  . Financial resource strain: Not on file  . Food insecurity - worry: Not on file  . Food insecurity - inability: Not on file  . Transportation needs - medical: Not on file  . Transportation needs - non-medical: Not on file  Occupational History    Employer: [employer]    Comment: Education officer, environmental  Tobacco Use  . Smoking status: Never Smoker  . Smokeless tobacco:  Never Used  Substance and Sexual Activity  . Alcohol use: Yes    Alcohol/week: 0.6 - 1.2 oz    Types: 1 - 2 Standard drinks or equivalent per week    Comment: Occasional beer  . Drug use: No  . Sexual activity: Yes    Partners: Male    Birth control/protection: Post-menopausal  Other Topics Concern  . Not on file  Social History Narrative   Occupation:    Never Smoked   Alcohol use- no   Married   Drug use- no   Regular Exercise- yes     Family History  Problem Relation Age of Onset  . Alcohol abuse Other   . Diabetes Other   . Hypertension Other   . Heart disease Other     ROS: no fevers or chills, productive cough, hemoptysis, dysphasia, odynophagia, melena, hematochezia, dysuria, hematuria, rash, seizure activity, orthopnea, PND, pedal edema, claudication. Remaining systems are negative.  Physical Exam: Well-developed well-nourished in no acute distress.  Skin is warm and dry.  HEENT is normal.  Neck is supple.  Chest is clear to auscultation with normal expansion.  Cardiovascular exam is regular rate and rhythm.  Abdominal exam nontender or distended. No masses palpated. Extremities show no edema. neuro grossly intact  Electrocardiogram sinus rhythm with PVCs. Septal infarct. A/P  1 Coronary artery disease-continue aspirin and statin.  2 hyperlipidemia-continue statin.   3 chest pain- symptoms are unchanged. Last nuclear study showed no ischemia. We'll not pursue further evaluation at this point.  4 palpitations-symptoms persist. Add Toprol 25 mg daily at bedtime.  Kirk Ruths, MD

## 2017-05-21 ENCOUNTER — Encounter: Payer: Self-pay | Admitting: Cardiology

## 2017-05-21 ENCOUNTER — Ambulatory Visit: Payer: Managed Care, Other (non HMO) | Admitting: Cardiology

## 2017-05-21 VITALS — BP 126/81 | HR 86 | Ht 61.0 in | Wt 147.8 lb

## 2017-05-21 DIAGNOSIS — E7800 Pure hypercholesterolemia, unspecified: Secondary | ICD-10-CM

## 2017-05-21 DIAGNOSIS — R002 Palpitations: Secondary | ICD-10-CM

## 2017-05-21 DIAGNOSIS — I2510 Atherosclerotic heart disease of native coronary artery without angina pectoris: Secondary | ICD-10-CM

## 2017-05-21 MED ORDER — METOPROLOL SUCCINATE ER 25 MG PO TB24: 25.0000 mg | ORAL_TABLET | Freq: Every day | ORAL | 6 refills | Status: DC

## 2017-05-21 NOTE — Patient Instructions (Signed)
Medication Instructions:   START METOPROLOL SUCC ER 25 MG ONCE DAILY AT BEDTIME  Follow-Up:  Your physician wants you to follow-up in: [place] will receive a reminder letter in the mail two months in advance. If you don't receive a letter, please call our office to schedule the follow-up appointment.   If you need a refill on your cardiac medications before your next appointment, please call your pharmacy.

## 2017-10-26 ENCOUNTER — Encounter: Payer: Self-pay | Admitting: Gastroenterology

## 2017-12-25 ENCOUNTER — Other Ambulatory Visit: Payer: Self-pay | Admitting: Cardiology

## 2017-12-25 DIAGNOSIS — I25110 Atherosclerotic heart disease of native coronary artery with unstable angina pectoris: Secondary | ICD-10-CM

## 2018-02-01 NOTE — Progress Notes (Signed)
HPI: FU coronary disease. Previously seen for chest pain. Stress echo was markedly abnormal with nonsustained ventricular tachycardia and ischemia in the anteroseptal wall and apex. Patient underwent cardiac catheterization in September of 2012 which revealed a 95% ostial LAD lesion. The patient had PCI with a drug-eluting stent. There was some jailing of the circumflex. Repeat cath at [facility] in Dec 2012 because of palpitations and chest pain revealed patency of LAD stent and she had no other significant CAD. Carotid Dopplers December 2012 showed 0-39% bilateral stenosis. 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 Patient has continued to have chest pain. She had repeat catheterization in May of 2014 which revealed normal left main, patent LAD stent, 50% mid LAD. There was a 30-40% ostial circumflex. Ejection fraction was 55%. Again had repeat cath 8/15 that showed no change (nonobstructive CAD). Nuclear study 7/18 showed EF 61 with no ischemia or infarction. Monitor 8/18 showed sinus with pacs, brief PAT and 6 beats NSVT. Since last seenshe has mild dyspnea with more vigorous activities.  No orthopnea, PND or pedal edema.  Occasional sharp pain for 1 to 2 seconds in her chest.  No exertional symptoms.  Continues to have occasional pain in her right shoulder.  Continues to have occasional palpitations.  Current Outpatient Medications  Medication Sig Dispense Refill  . amLODipine (NORVASC) 2.5 MG tablet Take 2.5 mg by mouth daily.    Marland Kitchen aspirin EC 81 MG tablet Take 81 mg by mouth every evening.    . Coenzyme Q10 (CO Q 10 PO) Take by mouth.    Marland Kitchen ibuprofen (ADVIL,MOTRIN) 200 MG tablet Take 200-400 mg by mouth every 6 (six) hours as needed for pain or headache.    . nitroGLYCERIN (NITROSTAT) 0.4 MG SL tablet Place 1 tablet (0.4 mg total) under the tongue every 5 (five) minutes as needed for chest pain. 25 tablet 3  . rosuvastatin (CRESTOR) 40 MG  tablet TAKE 1 TABLET EVERY NIGHT 90 tablet 1   No current facility-administered medications for this visit.      Past Medical History:  Diagnosis Date  . ALLERGIC RHINITIS 03/02/2007  . BREAST CYST, LEFT 11/23/2009  . CAD S/P percutaneous coronary angioplasty 01/2011   a) Exertional Back Pain & High RISK ST) - 95% Ostial LAD --> PCI with Promus DES 3.0 mm x 12 mm; b) LHC 08/2012: LAD stent Patent , 50% mid LAD; c) Neg Myoview 03/2012 d). LHC 12/09/2013 40-50% mid-LAD no progression, medical therapy. e) Neg nuc 2016, 2018.  Marland Kitchen HLD (hyperlipidemia)   . Incomplete left bundle branch block   . REACTIVE AIRWAY DISEASE 10/21/2008    Past Surgical History:  Procedure Laterality Date  . AUGMENTATION MAMMAPLASTY Bilateral 1990  . BREAST BIOPSY    . BREAST SURGERY     BILAT IMPLANTS  . LEFT HEART CATHETERIZATION WITH CORONARY ANGIOGRAM N/A 09/10/2012   Procedure: LEFT HEART CATHETERIZATION WITH CORONARY ANGIOGRAM;  Surgeon: Sherren Mocha, MD;  Location: [facility] CATH LAB;  Service: Cardiovascular;  Laterality: N/A;  . LEFT HEART CATHETERIZATION WITH CORONARY ANGIOGRAM N/A 12/09/2013   Procedure: LEFT HEART CATHETERIZATION WITH CORONARY ANGIOGRAM;  Surgeon: Leonie Man, MD;  Location: [facility] CATH LAB;  Service: Cardiovascular;  Laterality: N/A;  . PERCUTANEOUS CORONARY STENT INTERVENTION (PCI-S)  01/2011   Promus Premier DES 3.0 mm x12 mm Ostial LAD (extends into LM with partial "jailing" of Cx)    Social History   Socioeconomic History  .  Marital status: Married    Spouse name: Not on file  . Number of children: Not on file  . Years of education: Not on file  . Highest education level: Not on file  Occupational History    Employer: [employer]    Comment: Health systems manager  Social Needs  . Financial resource strain: Not on file  . Food insecurity:    Worry: Not on file    Inability: Not on file  . Transportation needs:    Medical: Not on file    Non-medical: Not on file  Tobacco  Use  . Smoking status: Never Smoker  . Smokeless tobacco: Never Used  Substance and Sexual Activity  . Alcohol use: Yes    Alcohol/week: 1.0 - 2.0 standard drinks    Types: 1 - 2 Standard drinks or equivalent per week    Comment: Occasional beer  . Drug use: No  . Sexual activity: Yes    Partners: Male    Birth control/protection: Post-menopausal  Lifestyle  . Physical activity:    Days per week: Not on file    Minutes per session: Not on file  . Stress: Not on file  Relationships  . Social connections:    Talks on phone: Not on file    Gets together: Not on file    Attends religious service: Not on file    Active member of club or organization: Not on file    Attends meetings of clubs or organizations: Not on file    Relationship status: Not on file  . Intimate partner violence:    Fear of current or ex partner: Not on file    Emotionally abused: Not on file    Physically abused: Not on file    Forced sexual activity: Not on file  Other Topics Concern  . Not on file  Social History Narrative   Occupation:    Never Smoked   Alcohol use- no   Married   Drug use- no   Regular Exercise- yes     Family History  Problem Relation Age of Onset  . Alcohol abuse Other   . Diabetes Other   . Hypertension Other   . Heart disease Other     ROS: fatigue but no fevers or chills, productive cough, hemoptysis, dysphasia, odynophagia, melena, hematochezia, dysuria, hematuria, rash, seizure activity, orthopnea, PND, pedal edema, claudication. Remaining systems are negative.  Physical Exam: Well-developed well-nourished in no acute distress.  Skin is warm and dry.  HEENT is normal.  Neck is supple.  Chest is clear to auscultation with normal expansion.  Cardiovascular exam is regular rate and rhythm.  Abdominal exam nontender or distended. No masses palpated. Extremities show no edema. neuro grossly intact  ECG-sinus rhythm at a rate of 77.  Left bundle branch block.   Personally reviewed  A/P  1 coronary artery disease-plan to continue aspirin and statin.  2 hyperlipidemia-continue statin.  Lipids and liver followed by primary care.  3 palpitations-she continues to have occasional palpitations.  Toprol caused some degree of fatigue previously.  However she was taking this in the morning.  Discontinue amlodipine.  Resume Toprol 25 mg nightly to see if she tolerates.  If not we will resume previous regimen.  4 chest pain-occasional sharp pain in her chest and continued pain in right shoulder.  Previous ischemia evaluation negative.  We can consider repeat functional study in the future if symptoms worsen.  Kirk Ruths, MD

## 2018-02-05 ENCOUNTER — Ambulatory Visit: Payer: Managed Care, Other (non HMO) | Admitting: Cardiology

## 2018-02-05 ENCOUNTER — Encounter: Payer: Self-pay | Admitting: Cardiology

## 2018-02-05 VITALS — BP 102/70 | HR 77 | Ht 61.0 in | Wt 149.0 lb

## 2018-02-05 DIAGNOSIS — E7800 Pure hypercholesterolemia, unspecified: Secondary | ICD-10-CM | POA: Diagnosis not present

## 2018-02-05 DIAGNOSIS — R002 Palpitations: Secondary | ICD-10-CM

## 2018-02-05 DIAGNOSIS — I2510 Atherosclerotic heart disease of native coronary artery without angina pectoris: Secondary | ICD-10-CM

## 2018-02-05 MED ORDER — METOPROLOL SUCCINATE ER 25 MG PO TB24: 25.0000 mg | ORAL_TABLET | Freq: Every day | ORAL | 3 refills | Status: DC

## 2018-02-05 NOTE — Patient Instructions (Signed)
Medication Instructions:   STOP AMLODIPINE  START METOPROLOL SUCC ER 25 MG ONCE DAILY AT BEDTIME  Follow-Up:  Your physician wants you to follow-up in: [place] will receive a reminder letter in the mail two months in advance. If you don't receive a letter, please call our office to schedule the follow-up appointment.   ;If you need a refill on your cardiac medications before your next appointment, please call your pharmacy.

## 2018-04-13 ENCOUNTER — Other Ambulatory Visit: Payer: Self-pay

## 2018-04-13 ENCOUNTER — Encounter: Payer: Self-pay | Admitting: Obstetrics and Gynecology

## 2018-04-13 ENCOUNTER — Ambulatory Visit (INDEPENDENT_AMBULATORY_CARE_PROVIDER_SITE_OTHER): Payer: Managed Care, Other (non HMO) | Admitting: Obstetrics and Gynecology

## 2018-04-13 VITALS — BP 148/82 | HR 100 | Resp 14 | Ht 61.5 in | Wt 150.0 lb

## 2018-04-13 DIAGNOSIS — R350 Frequency of micturition: Secondary | ICD-10-CM

## 2018-04-13 DIAGNOSIS — Z01419 Encounter for gynecological examination (general) (routine) without abnormal findings: Secondary | ICD-10-CM | POA: Diagnosis not present

## 2018-04-13 DIAGNOSIS — N6002 Solitary cyst of left breast: Secondary | ICD-10-CM | POA: Diagnosis not present

## 2018-04-13 DIAGNOSIS — Z9882 Breast implant status: Secondary | ICD-10-CM

## 2018-04-13 DIAGNOSIS — R319 Hematuria, unspecified: Secondary | ICD-10-CM

## 2018-04-13 LAB — POCT URINALYSIS DIPSTICK
Bilirubin, UA: NEGATIVE
GLUCOSE UA: NEGATIVE
Ketones, UA: NEGATIVE
LEUKOCYTES UA: NEGATIVE
NITRITE UA: NEGATIVE
PROTEIN UA: NEGATIVE
Urobilinogen, UA: 0.2 E.U./dL
pH, UA: 5 (ref 5.0–8.0)

## 2018-04-13 NOTE — Patient Instructions (Signed)

## 2018-04-13 NOTE — Progress Notes (Signed)
Patient scheduled while in office for bilateral Dx MMG with implants and left and right [place], if needed. Scheduled at [place] on 04/21/18 at 3:30pm, arriving at 3:10pm. Patient declined earlier appointments offered. Patient verbalizes understanding and is agreeable.

## 2018-04-13 NOTE — Progress Notes (Signed)
62 y.o. G13P0000 Married Caucasian female here for annual exam.   Patient complaining of bilateral breast pain--sharp shooting pain. She also has been having low pelvic pain and urinary frequency.  She has stabbing breast pain.  Right lateral and left mid breast.  Nothing makes it better or worse.   No vaginal bleeding.   Denies dysuria.  Has some back pain.   Using her lap top at lot.  Having urinary frequency day and night.  Right sided lower abdomen pain and diarrhea.  Stressed with work.   Husband disabled.   Urine Dip: Trace RBCs  She will see her PCP in January.   PCP: Anastasia Pall, MD  Patient's last menstrual period was 02/21/2013.           Sexually active: Yes.   female The current method of family planning is post menopausal status.    Exercising: No.  The patient does not participate in regular exercise at present. Smoker:  no  Health Maintenance: Pap: 04-07-17 Neg:Neg HR HPV,02-21-14 Neg:Neg HR HPV History of abnormal Pap:  no MMG: 03-03-17 3D Dia.Bil./Implants/unchanged distortion Lt.Br./screening 76yr./BiRads2--Pt.knows needs to schedule Colonoscopy: 12-14-07 normal with Dr.Jacobs;next due 12/2017--patient knows she needs to schedule BMD:  2010 Result :normal per patient TDaP:  2012 Gardasil:   no HIV: Unsure Hep C: Neg 2016 Screening Labs:  Hb today: PCP   reports that she has never smoked. She has never used smokeless tobacco. She reports that she drank alcohol. She reports that she does not use drugs.  Past Medical History:  Diagnosis Date  . ALLERGIC RHINITIS 03/02/2007  . BREAST CYST, LEFT 11/23/2009  . CAD S/P percutaneous coronary angioplasty 01/2011   a) Exertional Back Pain & High RISK ST) - 95% Ostial LAD --> PCI with Promus DES 3.0 mm x 12 mm; b) LHC 08/2012: LAD stent Patent , 50% mid LAD; c) Neg Myoview 03/2012 d). LHC 12/09/2013 40-50% mid-LAD no progression, medical therapy. e) Neg nuc 2016, 2018.  Marland Kitchen HLD (hyperlipidemia)   . Incomplete left  bundle branch block   . REACTIVE AIRWAY DISEASE 10/21/2008    Past Surgical History:  Procedure Laterality Date  . AUGMENTATION MAMMAPLASTY Bilateral 1990  . BREAST BIOPSY    . BREAST SURGERY     BILAT IMPLANTS  . LEFT HEART CATHETERIZATION WITH CORONARY ANGIOGRAM N/A 09/10/2012   Procedure: LEFT HEART CATHETERIZATION WITH CORONARY ANGIOGRAM;  Surgeon: Sherren Mocha, MD;  Location[facility] CATH LAB;  Service: Cardiovascular;  Laterality: N/A;  . LEFT HEART CATHETERIZATION WITH CORONARY ANGIOGRAM N/A 12/09/2013   Procedure: LEFT HEART CATHETERIZATION WITH CORONARY ANGIOGRAM;  Surgeon: Leonie Man, MD;  Location: [facility] CATH LAB;  Service: Cardiovascular;  Laterality: N/A;  . PERCUTANEOUS CORONARY STENT INTERVENTION (PCI-S)  01/2011   Promus Premier DES 3.0 mm x12 mm Ostial LAD (extends into LM with partial "jailing" of Cx)    Current Outpatient Medications  Medication Sig Dispense Refill  . aspirin EC 81 MG tablet Take 81 mg by mouth every evening.    . Coenzyme Q10 (CO Q 10 PO) Take by mouth.    Marland Kitchen ibuprofen (ADVIL,MOTRIN) 200 MG tablet Take 200-400 mg by mouth every 6 (six) hours as needed for pain or headache.    . metoprolol succinate (TOPROL XL) 25 MG 24 hr tablet Take 1 tablet (25 mg total) by mouth daily. 90 tablet 3  . nitroGLYCERIN (NITROSTAT) 0.4 MG SL tablet Place 1 tablet (0.4 mg total) under the tongue every 5 (five) minutes as needed  for chest pain. 25 tablet 3  . rosuvastatin (CRESTOR) 40 MG tablet TAKE 1 TABLET EVERY NIGHT 90 tablet 1   No current facility-administered medications for this visit.     Family History  Problem Relation Age of Onset  . Alcohol abuse Other   . Diabetes Other   . Hypertension Other   . Heart disease Other     Review of Systems  Genitourinary: Positive for frequency.  All other systems reviewed and are negative.   Exam:   BP (!) 148/82 (BP Location: Right Arm, Patient Position: Sitting, Cuff Size: Normal)   Pulse 100   Resp 14   Ht 5' 1.5"  (1.562 m)   Wt 150 lb (68 kg)   LMP 02/21/2013   BMI 27.88 kg/m     General appearance: alert, cooperative and appears stated age Head: Normocephalic, without obvious abnormality, atraumatic Neck: no adenopathy, supple, symmetrical, trachea midline and thyroid normal to inspection and palpation Lungs: clear to auscultation bilaterally Breasts: bilateral implants, normal appearance, no masses or tenderness, No nipple retraction or dimpling, No nipple discharge or bleeding, No axillary or supraclavicular adenopathy Heart: regular rate and rhythm Abdomen: soft, non-tender; no masses, no organomegaly Extremities: extremities normal, atraumatic, no cyanosis or edema Skin: Skin color, texture, turgor normal. No rashes or lesions Lymph nodes: Cervical, supraclavicular, and axillary nodes normal. No abnormal inguinal nodes palpated Neurologic: Grossly normal  Pelvic: External genitalia:  no lesions              Urethra:  normal appearing urethra with no masses, tenderness or lesions              Bartholins and Skenes: normal                 Vagina: normal appearing vagina with normal color and discharge, no lesions              Cervix: no lesions              Pap taken: No. Bimanual Exam:  Uterus:  normal size, contour, position, consistency, mobility, non-tender              Adnexa: no mass, fullness, tenderness              Rectal exam: Yes.  .  Confirms.              Anus:  normal sphincter tone, no lesions  Chaperone was present for exam.  Assessment:   Well woman visit with normal exam. Bilateral breast implants. Bilateral breast pain.  Hx CAD.  LBBB. RLQ pain.  Trace RBCs.    Plan: Mammogram dx bilateral and bilateral US.  Recommended self breast awareness. Pap and HR HPV as above. Guidelines for Calcium, Vitamin D, regular exercise program including cardiovascular and weight bearing exercise. Urine micro and cx. She will schedule colonoscopy.  Flu vaccine recommended.   Follow up annually and prn.    After visit summary provided.

## 2018-04-14 LAB — URINALYSIS, MICROSCOPIC ONLY
Bacteria, UA: NONE SEEN
Casts: NONE SEEN /lpf

## 2018-04-14 LAB — URINE CULTURE

## 2018-04-17 ENCOUNTER — Telehealth: Payer: Self-pay

## 2018-04-17 NOTE — Telephone Encounter (Signed)
Returned call to patient's cell phone---Mailbox FULL and unable to leave a message.

## 2018-04-17 NOTE — Telephone Encounter (Signed)
Called patient's cell # but mailbox full. Called HM # and left message to return my call.

## 2018-04-17 NOTE — Telephone Encounter (Signed)
-----   Message from Nunzio Cobbs, MD sent at 04/15/2018  9:10 AM EST ----- Please inform patient of her negative urine micro and culture.   I am highlighting this result so you know to contact the patient.

## 2018-04-17 NOTE — Telephone Encounter (Signed)
Patient is returning a call to [place]. Patient asked that you call her on her cell number.

## 2018-04-17 NOTE — Telephone Encounter (Signed)
Patient notified of negative micro and urine culture.

## 2018-04-17 NOTE — Telephone Encounter (Signed)
Routed to triage in error. Rerouting to [place].

## 2018-04-17 NOTE — Telephone Encounter (Signed)
Patient returned call to [facility].

## 2018-04-17 NOTE — Telephone Encounter (Signed)
Returned call to patient's cell but unable to leave message with results because "mailbox full".

## 2018-04-21 ENCOUNTER — Ambulatory Visit: Payer: Managed Care, Other (non HMO)

## 2018-04-21 ENCOUNTER — Ambulatory Visit
Admission: RE | Admit: 2018-04-21 | Discharge: 2018-04-21 | Disposition: A | Discharge status: Home / Self Care | Payer: Managed Care, Other (non HMO) | Source: Ambulatory Visit | Attending: Obstetrics and Gynecology | Admitting: Obstetrics and Gynecology

## 2018-04-21 DIAGNOSIS — Z9882 Breast implant status: Secondary | ICD-10-CM

## 2018-05-19 DIAGNOSIS — E559 Vitamin D deficiency, unspecified: Secondary | ICD-10-CM | POA: Insufficient documentation

## 2018-08-04 ENCOUNTER — Telehealth: Payer: Self-pay | Admitting: Cardiology

## 2018-08-04 NOTE — Telephone Encounter (Addendum)
Spoke with pt, she has multiple complaints. She is c/o SOB with walking up the stairs in her home, it comes and goes. She denies any edema or orthopnea. She c/o dizziness that comes and goes and is not related to position. She c/o a stabbing chest pain that goes into her back that comes and goes but can last up to 1 hour. She c/o increased palpitations and is currently not taking the metoprolol. She had an episode last week of severe chest and back pain and SOB. Her husband reports her face and eyes turned bright red. He was able to help her outside to get cool air and after taking lorazepam her breathing got better. Her bp during this time was 148/97. She would not go to the ER or call 911. She feels a lot of this is related to stress and her working 20 hours a day at times. She works for [employer] and during this virus she has been under extreme stress. Advised patient to restart metoprolol at bedtime to help with the palpitations. She will take Claritin OTC to help with the allergy congestion she has which will hopefully help with the dizziness. She was encouraged to take the lorazepam as needed when her breathing is bad. She was offered a video visit with dr Stanford Breed on Friday this week and she will decide once she hears what dr Stanford Breed thinks. Will forward for dr Stanford Breed review

## 2018-08-04 NOTE — Telephone Encounter (Signed)
Agree with plan Brian Crenshaw  

## 2018-08-04 NOTE — Telephone Encounter (Signed)
Spoke with pt, Aware of dr Jacalyn Lefevre recommendations. Follow up scheduled Friday for telephone visit. She will call and cancel if not needed.

## 2018-08-04 NOTE — Telephone Encounter (Signed)
Pt c/o Shortness Of Breath: STAT if SOB developed within the last 24 hours or pt is noticeably SOB on the phone  1. Are you currently SOB (can you hear that pt is SOB on the phone)? No at this minute  2. How long have you been experiencing SOB? About a week  3. Are you SOB when sitting or when up moving around? When up and moving  4. Are you currently experiencing any other symptoms?  Dizziness and pain in right shoulder

## 2018-08-05 ENCOUNTER — Other Ambulatory Visit: Payer: Self-pay

## 2018-08-06 ENCOUNTER — Encounter: Payer: Self-pay | Admitting: Cardiology

## 2018-08-06 ENCOUNTER — Telehealth: Payer: Self-pay

## 2018-08-06 NOTE — Telephone Encounter (Signed)
Virtual Visit Pre-Appointment Phone Call  Steps For Call:  1. Confirm consent - "In the setting of the current Covid19 crisis, you are scheduled for a (phone or video) visit with your provider on (date) at (time).  Just as we do with many in-office visits, in order for you to participate in this visit, we must obtain consent.  If you'd like, I can send this to your mychart (if signed up) or email for you to review.  Otherwise, I can obtain your verbal consent now.  All virtual visits are billed to your insurance company just like a normal visit would be.  By agreeing to a virtual visit, we'd like you to understand that the technology does not allow for your provider to perform an examination, and thus may limit your provider's ability to fully assess your condition.  Finally, though the technology is pretty good, we cannot assure that it will always work on either your or our end, and in the setting of a video visit, we may have to convert it to a phone-only visit.  In either situation, we cannot ensure that we have a secure connection.  Are you willing to proceed?"  2. Give patient instructions for WebEx download to smartphone as below if video visit  3. Advise patient to be prepared with any vital sign or heart rhythm information, their current medicines, and a piece of paper and pen handy for any instructions they may receive the day of their visit  4. Inform patient they will receive a phone call 15 minutes prior to their appointment time (may be from unknown caller ID) so they should be prepared to answer  5. Confirm that appointment type is correct in Epic appointment notes (video vs telephone)    TELEPHONE CALL NOTE  Angelica [name] has been deemed a candidate for a follow-up tele-health visit to limit community exposure during the Covid-19 pandemic. I spoke with the patient via phone to ensure availability of phone/video source, confirm preferred email & phone number, and discuss  instructions and expectations.  I reminded Angelica [name] to be prepared with any vital sign and/or heart rhythm information that could potentially be obtained via home monitoring, at the time of her visit. I reminded Angelica [name] to expect a phone call at the time of her visit if her visit.  Did the patient verbally acknowledge consent to treatment? YES  Angelica [name], [place] 08/06/2018 11:21 AM   DOWNLOADING THE WEBEX SOFTWARE TO SMARTPHONE  - If Apple, go to [employer] and type in WebEx in the search bar. [place] [employer], the blue/green circle. The app is free but as with any other app downloads, their phone may require them to verify saved payment information or Apple password. The patient does NOT have to create an account.  - If Android, ask patient to go to [employer] and type in WebEx in the search bar. [place] [employer], the blue/green circle. The app is free but as with any other app downloads, their phone may require them to verify saved payment information or Android password. The patient does NOT have to create an account.   CONSENT FOR TELE-HEALTH VISIT - PLEASE REVIEW  I hereby voluntarily request, consent and authorize [facility] and its employed or contracted physicians, physician assistants, nurse practitioners or other licensed health care professionals (the Practitioner), to provide me with telemedicine health care services (the "Services") as deemed necessary by the treating Practitioner. I  acknowledge and consent to receive the Services by the Practitioner via telemedicine. I understand that the telemedicine visit will involve communicating with the Practitioner through live audiovisual communication technology and the disclosure of certain medical information by electronic transmission. I acknowledge that I have been given the opportunity to request an in-person assessment or other available alternative prior to the telemedicine  visit and am voluntarily participating in the telemedicine visit.  I understand that I have the right to withhold or withdraw my consent to the use of telemedicine in the course of my care at any time, without affecting my right to future care or treatment, and that the Practitioner or I may terminate the telemedicine visit at any time. I understand that I have the right to inspect all information obtained and/or recorded in the course of the telemedicine visit and may receive copies of available information for a reasonable fee.  I understand that some of the potential risks of receiving the Services via telemedicine include:  Marland Kitchen Delay or interruption in medical evaluation due to technological equipment failure or disruption; . Information transmitted may not be sufficient (e.g. poor resolution of images) to allow for appropriate medical decision making by the Practitioner; and/or  . In rare instances, security protocols could fail, causing a breach of personal health information.  Furthermore, I acknowledge that it is my responsibility to provide information about my medical history, conditions and care that is complete and accurate to the best of my ability. I acknowledge that Practitioner's advice, recommendations, and/or decision may be based on factors not within their control, such as incomplete or inaccurate data provided by me or distortions of diagnostic images or specimens that may result from electronic transmissions. I understand that the practice of medicine is not an exact science and that Practitioner makes no warranties or guarantees regarding treatment outcomes. I acknowledge that I will receive a copy of this consent concurrently upon execution via email to the email address I last provided but may also request a printed copy by calling the office of [place].    I understand that my insurance will be billed for this visit.   I have read or had this consent read to me. . I  understand the contents of this consent, which adequately explains the benefits and risks of the Services being provided via telemedicine.  . I have been provided ample opportunity to ask questions regarding this consent and the Services and have had my questions answered to my satisfaction. . I give my informed consent for the services to be provided through the use of telemedicine in my medical care  By participating in this telemedicine visit I agree to the above.

## 2018-08-06 NOTE — Progress Notes (Addendum)
Virtual Visit via Telephone Note    Evaluation Performed:  Follow-up visit  This visit type was conducted due to national recommendations for restrictions regarding the COVID-19 Pandemic (e.g. social distancing).  This format is felt to be most appropriate for this patient at this time.  All issues noted in this document were discussed and addressed.  No physical exam was performed (except for noted visual exam findings with Video Visits).  Please refer to the patient's chart (MyChart message for video visits and phone note for telephone visits) for the patient's consent to telehealth for [facility].  Date:  08/06/2018   ID:  Angelica, [name] [birth year], MRN [id number]  Patient Location:    Provider location:   [place]  PCP:  Chesley Noon, MD  Cardiologist:  Kirk Ruths, MD  Electrophysiologist:  None   Chief Complaint:  FU CAD and CP  History of Present Illness:    Angelica [name] is a 63 y.o. female who presents via audio/video conferencing for a telehealth visit today.    The patient does not have symptoms concerning for COVID-19 infection (fever, chills, cough, or new shortness of breath).    FU coronary disease. Previously seen for chest pain. Stress echo was markedly abnormal with nonsustained ventricular tachycardia and ischemia in the anteroseptal wall and apex. Patient underwent cardiac catheterization in September of 2012 which revealed a 95% ostial LAD lesion. The patient had PCI with a drug-eluting stent. There was some jailing of the circumflex. Repeat cath at [facility] in Dec 2012 because of palpitations and chest pain revealed patency of LAD stent and she had no other significant CAD. Carotid Dopplers December 2012 showed 0-39% bilateral stenosis. 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 Patient has continued to have chest pain. She had repeat catheterization in May of 2014 which revealed  normal left main, patent LAD stent, 50% mid LAD. There was a 30-40% ostial circumflex. Ejection fraction was 55%. Again had repeat cath 8/15 that showed no change (nonobstructive CAD).Nuclear study 7/18 showed EF 61 with no ischemia or infarction. Monitor 8/18 showed sinus with pacs, brief PAT and 6 beats NSVT.Since last seenpatient continues to have problems with palpitations.  She states that she is now working 140 hours a week for [employer] and is under significant amounts of stress.  She has had several bouts of chest pain one which improved with Ativan.  She has had spells of dyspnea.  Patient's husband was on the phone during the conversation and was very concerned and confrontational about her care.   Past Medical History:  Diagnosis Date   ALLERGIC RHINITIS 03/02/2007   BREAST CYST, LEFT 11/23/2009   CAD S/P percutaneous coronary angioplasty 01/2011   a) Exertional Back Pain & High RISK ST) - 95% Ostial LAD --> PCI with Promus DES 3.0 mm x 12 mm; b) LHC 08/2012: LAD stent Patent , 50% mid LAD; c) Neg Myoview 03/2012 d). LHC 12/09/2013 40-50% mid-LAD no progression, medical therapy. e) Neg nuc 2016, 2018.   HLD (hyperlipidemia)    Incomplete left bundle branch block    REACTIVE AIRWAY DISEASE 10/21/2008   Past Surgical History:  Procedure Laterality Date   AUGMENTATION MAMMAPLASTY Bilateral 1990   BREAST BIOPSY     BREAST SURGERY     BILAT IMPLANTS   LEFT HEART CATHETERIZATION WITH CORONARY ANGIOGRAM N/A 09/10/2012   Procedure: LEFT HEART CATHETERIZATION WITH CORONARY ANGIOGRAM;  Surgeon: Sherren Mocha, MD;  Location: [place] CATH LAB;  Service: Cardiovascular;  Laterality: N/A;   LEFT HEART CATHETERIZATION WITH CORONARY ANGIOGRAM N/A 12/09/2013   Procedure: LEFT HEART CATHETERIZATION WITH CORONARY ANGIOGRAM;  Surgeon: Leonie Man, MD;  Location: [facility] CATH LAB;  Service: Cardiovascular;  Laterality: N/A;   PERCUTANEOUS CORONARY STENT INTERVENTION (PCI-S)  01/2011   Promus Premier DES  3.0 mm x12 mm Ostial LAD (extends into LM with partial "jailing" of Cx)     No outpatient medications have been marked as taking for the 08/07/18 encounter (Appointment) with Lelon Perla, MD.     Allergies:   Prednisone   Social History   Tobacco Use   Smoking status: Never Smoker   Smokeless tobacco: Never Used  Substance Use Topics   Alcohol use: Not Currently    Comment: Occasional beer   Drug use: No     Family Hx: The patient's family history includes Alcohol abuse in an other family member; Diabetes in an other family member; Heart disease in an other family member; Hypertension in an other family member.  ROS:   Please see the history of present illness.    No fevers, chills, productive cough, hemoptysis, melena or hematochezia. All other systems reviewed and are negative.   Labs/Other Tests and Data Reviewed:     Recent Lipid Panel Lab Results  Component Value Date/Time   CHOL 145 03/30/2014 08:18 AM   TRIG 48.0 03/30/2014 08:18 AM   TRIG 67 03/13/2006 11:12 AM   HDL 80.10 03/30/2014 08:18 AM   CHOLHDL 2 03/30/2014 08:18 AM   LDLCALC 55 03/30/2014 08:18 AM   LDLDIRECT 101.2 11/15/2008 09:43 AM    Wt Readings from Last 3 Encounters:  04/13/18 150 lb (68 kg)  02/05/18 149 lb (67.6 kg)  05/21/17 147 lb 12.8 oz (67 kg)     Objective:    Vital Signs:  LMP 02/21/2013    Physical exam not performed as this was a phone visit  ASSESSMENT & PLAN:    1.  Coronary artery disease-plan to continue aspirin and statin.  2 chest pain-patient has had occasional chest pain but she feels this is related to stress.  Some of her symptoms were relieved with Ativan.  As this is a phone visit I do not have an electrocardiogram.  They feel that her symptoms are related to the stress of working 140 hours daily with [employer].  We have provided a letter stating she should not work more than 16 hours daily at her request.  We discussed further testing.  Given covid pandemic  we are avoiding functional studies in the office to decrease risk of transmission.  I explained that if she had worsening symptoms she should be seen in the emergency room.  We also prescribed Ativan 0.5 mg twice daily as needed for stress and anxiety at their request.  Long discussion with patient and husband as he stated that we were not doing enough.  As outlined previously she has had catheterization since her previous stent that showed nonobstructive coronary disease and her most recent nuclear study showed no ischemia.  3 hyperlipidemia-continue statin.  4 palpitations-patient continues to have palpitations.  We will continue with present dose of Toprol.  She feels she is under significant amounts of stress from her job.  We have given a prescription for Ativan 0.5 mg as needed twice daily.  We also discussed [employer].  5 Hypertension-BP running high at home per their report; will add amlodipine 5 mg daily and follow.  COVID-19 Education: The signs and symptoms of COVID-19 were discussed with the patient and how to seek care for testing (follow up with PCP or arrange E-visit).  The importance of social distancing was discussed today.  Patient Risk:   After full review of this patient's clinical status, I feel that they are at least moderate risk at this time.  Time:   Today, I have spent 30 minutes with the patient with telehealth technology discussing CP, palpitations, hypertension and stress.     Medication Adjustments/Labs and Tests Ordered: Current medicines are reviewed at length with the patient today.  Concerns regarding medicines are outlined above.  Tests Ordered: No orders of the defined types were placed in this encounter.  Medication Changes: No orders of the defined types were placed in this encounter.   Disposition:  Follow up in 3 month(s)  Signed, Kirk Ruths, MD  08/06/2018 1:10 PM    [facility]

## 2018-08-07 ENCOUNTER — Encounter: Payer: Self-pay | Admitting: Cardiology

## 2018-08-07 ENCOUNTER — Telehealth (INDEPENDENT_AMBULATORY_CARE_PROVIDER_SITE_OTHER): Payer: Managed Care, Other (non HMO) | Admitting: Cardiology

## 2018-08-07 ENCOUNTER — Encounter: Payer: Self-pay | Admitting: *Deleted

## 2018-08-07 ENCOUNTER — Other Ambulatory Visit: Payer: Self-pay | Admitting: *Deleted

## 2018-08-07 ENCOUNTER — Other Ambulatory Visit: Payer: Self-pay

## 2018-08-07 VITALS — BP 167/99 | HR 107 | Ht 61.0 in | Wt 145.0 lb

## 2018-08-07 DIAGNOSIS — Z7982 Long term (current) use of aspirin: Secondary | ICD-10-CM

## 2018-08-07 DIAGNOSIS — I2510 Atherosclerotic heart disease of native coronary artery without angina pectoris: Secondary | ICD-10-CM | POA: Diagnosis not present

## 2018-08-07 DIAGNOSIS — R002 Palpitations: Secondary | ICD-10-CM | POA: Diagnosis not present

## 2018-08-07 DIAGNOSIS — Z79899 Other long term (current) drug therapy: Secondary | ICD-10-CM

## 2018-08-07 DIAGNOSIS — E7800 Pure hypercholesterolemia, unspecified: Secondary | ICD-10-CM

## 2018-08-07 DIAGNOSIS — I10 Essential (primary) hypertension: Secondary | ICD-10-CM

## 2018-08-07 DIAGNOSIS — Z7189 Other specified counseling: Secondary | ICD-10-CM

## 2018-08-07 DIAGNOSIS — R072 Precordial pain: Secondary | ICD-10-CM

## 2018-08-07 DIAGNOSIS — E785 Hyperlipidemia, unspecified: Secondary | ICD-10-CM

## 2018-08-07 MED ORDER — AMLODIPINE BESYLATE 5 MG PO TABS: 5.0000 mg | ORAL_TABLET | Freq: Every day | ORAL | 3 refills | Status: DC

## 2018-08-07 MED ORDER — LORAZEPAM 0.5 MG PO TABS: 0.5000 mg | ORAL_TABLET | Freq: Three times a day (TID) | ORAL | 0 refills | Status: DC | PRN

## 2018-08-07 NOTE — Patient Instructions (Signed)
Medication Instructions:  START AMLODIPINE 5 MG ONCE DAILY  ATIVAN 0.5 MG TWICE DAILY AS NEEDED If you need a refill on your cardiac medications before your next appointment, please call your pharmacy.   Lab work: If you have labs (blood work) drawn today and your tests are completely normal, you will receive your results only by: Marland Kitchen MyChart Message (if you have MyChart) OR . A paper copy in the mail If you have any lab test that is abnormal or we need to change your treatment, we will call you to review the results   Follow-Up: At [facility], you and your health needs are our priority.  As part of our continuing mission to provide you with exceptional heart care, we have created designated Provider Care Teams.  These Care Teams include your primary Cardiologist (physician) and Advanced Practice Providers (APPs -  Physician Assistants and Nurse Practitioners) who all work together to provide you with the care you need, when you need it. Your physician recommends that you schedule a follow-up appointment in: as scheduled

## 2018-08-28 ENCOUNTER — Telehealth: Payer: Self-pay | Admitting: *Deleted

## 2018-08-28 NOTE — Telephone Encounter (Signed)
Late entry, I have called and left messages on the patient home and mobile number to call me. She has not returned my calls.

## 2018-09-07 ENCOUNTER — Telehealth: Payer: Self-pay | Admitting: Cardiology

## 2018-09-07 NOTE — Progress Notes (Signed)
Virtual Visit via Video Note   This visit type was conducted due to national recommendations for restrictions regarding the COVID-19 Pandemic (e.g. social distancing) in an effort to limit this patient's exposure and mitigate transmission in our community.  Due to her co-morbid illnesses, this patient is at least at moderate risk for complications without adequate follow up.  This format is felt to be most appropriate for this patient at this time.  All issues noted in this document were discussed and addressed.  A limited physical exam was performed with this format.  Please refer to the patient's chart for her consent to telehealth for [facility].   Date:  09/08/2018   ID:  Angelica [name], Angelica [name] [birth year], MRN [id number]  Patient Location: Home Provider Location: Home  PCP:  Chesley Noon, MD  Cardiologist:  Kirk Ruths, MD   Evaluation Performed:  Follow-Up Visit  Chief Complaint:  FU CAD  History of Present Illness:    FU coronary disease. Previously seen for chest pain. Stress echo was markedly abnormal with nonsustained ventricular tachycardia and ischemia in the anteroseptal wall and apex. Patient underwent cardiac catheterization in September of 2012 which revealed a 95% ostial LAD lesion. The patient had PCI with a drug-eluting stent. There was some jailing of the circumflex. Repeat cath at [facility] in Dec 2012 because of palpitations and chest pain revealed patency of LAD stent and she had no other significant CAD. Carotid Dopplers December 2012 showed 0-39% bilateral stenosis. 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 Patient has continued to have chest pain. She had repeat catheterization in May of 2014 which revealed normal left main, patent LAD stent, 50% mid LAD. There was a 30-40% ostial circumflex. Ejection fraction was 55%. Again had repeat cath 8/15 that showed no change (nonobstructive CAD).Nuclear study 7/18  showed EF 61 with no ischemia or infarction. 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  She has mild dyspnea on exertion but no orthopnea, PND or pedal edema.  She has occasional pain in her shoulders and an occasional brief pain in her chest which is unchanged.  She states it is unlike her symptoms when she had her stent put in.  The patient does not have symptoms concerning for COVID-19 infection (fever, chills, cough, or new shortness of breath).    Past Medical History:  Diagnosis Date   ALLERGIC RHINITIS 03/02/2007   BREAST CYST, LEFT 11/23/2009   CAD S/P percutaneous coronary angioplasty 01/2011   a) Exertional Back Pain & High RISK ST) - 95% Ostial LAD --> PCI with Promus DES 3.0 mm x 12 mm; b) LHC 08/2012: LAD stent Patent , 50% mid LAD; c) Neg Myoview 03/2012 d). LHC 12/09/2013 40-50% mid-LAD no progression, medical therapy. e) Neg nuc 2016, 2018.   HLD (hyperlipidemia)    Incomplete left bundle branch block    REACTIVE AIRWAY DISEASE 10/21/2008   Past Surgical History:  Procedure Laterality Date   AUGMENTATION MAMMAPLASTY Bilateral 1990   BREAST BIOPSY     BREAST SURGERY     BILAT IMPLANTS   LEFT HEART CATHETERIZATION WITH CORONARY ANGIOGRAM N/A 09/10/2012   Procedure: LEFT HEART CATHETERIZATION WITH CORONARY ANGIOGRAM;  Surgeon: Sherren Mocha, MD;  Location: [facility] CATH LAB;  Service: Cardiovascular;  Laterality: N/A;   LEFT HEART CATHETERIZATION WITH CORONARY ANGIOGRAM N/A 12/09/2013   Procedure: LEFT HEART CATHETERIZATION WITH  CORONARY ANGIOGRAM;  Surgeon: Leonie Man, MD;  Location: [facility] CATH LAB;  Service: Cardiovascular;  Laterality: N/A;   PERCUTANEOUS CORONARY STENT INTERVENTION (PCI-S)  01/2011   Promus Premier DES 3.0 mm x12 mm Ostial LAD (extends into LM with partial "jailing" of Cx)     Current Meds  Medication Sig   aspirin EC 81 MG tablet Take 81 mg by mouth  every evening.   Coenzyme Q10 (CO Q 10 PO) Take by mouth.   ibuprofen (ADVIL,MOTRIN) 200 MG tablet Take 200-400 mg by mouth every 6 (six) hours as needed for pain or headache.   LORazepam (ATIVAN) 0.5 MG tablet Take 1 tablet (0.5 mg total) by mouth every 8 (eight) hours as needed for anxiety.   metoprolol succinate (TOPROL XL) 25 MG 24 hr tablet Take 1 tablet (25 mg total) by mouth daily.   nitroGLYCERIN (NITROSTAT) 0.4 MG SL tablet Place 1 tablet (0.4 mg total) under the tongue every 5 (five) minutes as needed for chest pain.   rosuvastatin (CRESTOR) 40 MG tablet TAKE 1 TABLET EVERY NIGHT     Allergies:   Prednisone   Social History   Tobacco Use   Smoking status: Never Smoker   Smokeless tobacco: Never Used  Substance Use Topics   Alcohol use: Not Currently    Comment: Occasional beer   Drug use: No     Family Hx: The patient's family history includes Alcohol abuse in an other family member; Diabetes in an other family member; Heart disease in an other family member; Hypertension in an other family member.  ROS:   Please see the history of present illness.    No fevers, chills or productive cough.  Patient does complain of fatigue. All other systems reviewed and are negative.   Recent Lipid Panel Lab Results  Component Value Date/Time   CHOL 145 03/30/2014 08:18 AM   TRIG 48.0 03/30/2014 08:18 AM   TRIG 67 03/13/2006 11:12 AM   HDL 80.10 03/30/2014 08:18 AM   CHOLHDL 2 03/30/2014 08:18 AM   LDLCALC 55 03/30/2014 08:18 AM   LDLDIRECT 101.2 11/15/2008 09:43 AM    Wt Readings from Last 3 Encounters:  09/08/18 145 lb (65.8 kg)  08/07/18 145 lb (65.8 kg)  04/13/18 150 lb (68 kg)     Objective:    Vital Signs:  BP (!) 147/91    Pulse 78    Ht 5\' 2"  (1.575 m)    Wt 145 lb (65.8 kg)    LMP 02/21/2013    BMI 26.52 kg/m    VITAL SIGNS:  reviewed  Patient answers all questions appropriately. Normal affect No acute distress Remainder physical examination not  performed (telehealth visit; coronavirus pandemic)  ASSESSMENT & PLAN:    1. Coronary artery disease-plan to continue medical therapy with aspirin and statin. 2. Chest pain-she has had occasional chest pain for quite some time.  Her symptoms are unchanged.  We will arrange a stress nuclear study in the next 4 to 6 weeks to risk stratify. 3. Hyperlipidemia-continue statin. 4. Palpitations-plan to continue present dose of beta-blocker. 5. Hypertension-patient's blood pressure is mildly elevated.  We added amlodipine previously but she states her stress has improved as her hours at work have decreased.  Her blood pressure has also improved.  She states it can be as low as systolic 347.  We will follow and adjust regimen as needed.  COVID-19 Education: The importance of social distancing was discussed today.  Time:   Today, I  have spent 15 minutes with the patient with telehealth technology discussing the above problems.     Medication Adjustments/Labs and Tests Ordered: Current medicines are reviewed at length with the patient today.  Concerns regarding medicines are outlined above.   Tests Ordered: No orders of the defined types were placed in this encounter.   Medication Changes: No orders of the defined types were placed in this encounter.   Disposition:  Follow up in 6 month(s)  Signed, Kirk Ruths, MD  09/08/2018 11:24 AM    [place]

## 2018-09-08 ENCOUNTER — Encounter: Payer: Self-pay | Admitting: Cardiology

## 2018-09-08 ENCOUNTER — Encounter: Payer: Self-pay | Admitting: *Deleted

## 2018-09-08 ENCOUNTER — Telehealth (INDEPENDENT_AMBULATORY_CARE_PROVIDER_SITE_OTHER): Payer: Managed Care, Other (non HMO) | Admitting: Cardiology

## 2018-09-08 VITALS — BP 147/91 | HR 78 | Ht 62.0 in | Wt 145.0 lb

## 2018-09-08 DIAGNOSIS — E7800 Pure hypercholesterolemia, unspecified: Secondary | ICD-10-CM | POA: Diagnosis not present

## 2018-09-08 DIAGNOSIS — I10 Essential (primary) hypertension: Secondary | ICD-10-CM | POA: Diagnosis not present

## 2018-09-08 DIAGNOSIS — I2510 Atherosclerotic heart disease of native coronary artery without angina pectoris: Secondary | ICD-10-CM

## 2018-09-08 DIAGNOSIS — R072 Precordial pain: Secondary | ICD-10-CM | POA: Diagnosis not present

## 2018-09-08 NOTE — Patient Instructions (Signed)
Medication Instructions:  NO CHANGE If you need a refill on your cardiac medications before your next appointment, please call your pharmacy.   Lab work: If you have labs (blood work) drawn today and your tests are completely normal, you will receive your results only by: Marland Kitchen MyChart Message (if you have MyChart) OR . A paper copy in the mail If you have any lab test that is abnormal or we need to change your treatment, we will call you to review the results.  Testing/Procedures: Your physician has requested that you have a lexiscan myoview. For further information please visit [email or web address]. Please follow instruction sheet, as given.  [place]  Follow-Up: At [facility], you and your health needs are our priority.  As part of our continuing mission to provide you with exceptional heart care, we have created designated Provider Care Teams.  These Care Teams include your primary Cardiologist (physician) and Advanced Practice Providers (APPs -  Physician Assistants and Nurse Practitioners) who all work together to provide you with the care you need, when you need it. Your physician recommends that you schedule a follow-up appointment in:  [place]

## 2018-10-05 ENCOUNTER — Encounter (HOSPITAL_COMMUNITY): Payer: Managed Care, Other (non HMO)

## 2018-11-02 ENCOUNTER — Encounter (HOSPITAL_COMMUNITY): Payer: Managed Care, Other (non HMO)

## 2018-11-02 NOTE — Telephone Encounter (Signed)
Opened in error

## 2018-11-26 ENCOUNTER — Telehealth (HOSPITAL_COMMUNITY): Payer: Self-pay | Admitting: *Deleted

## 2018-11-26 NOTE — Telephone Encounter (Signed)
Attempted to call patient regarding upcoming appointment- no answer, mailbox full.  Angelica [name]

## 2018-11-27 ENCOUNTER — Telehealth (HOSPITAL_COMMUNITY): Payer: Self-pay

## 2018-11-27 NOTE — Telephone Encounter (Signed)
Patient given detailed instructions per Myocardial Perfusion Study Information Sheet for the test on 12/14/2018 at 10:45. Patient notified to arrive 15 minutes early and that it is imperative to arrive on time for appointment to keep from having the test rescheduled.  If you need to cancel or reschedule your appointment, please call the office within 24 hours of your appointment. . Patient verbalized understanding.EHK

## 2018-11-30 ENCOUNTER — Encounter (HOSPITAL_COMMUNITY): Payer: Managed Care, Other (non HMO)

## 2018-12-14 ENCOUNTER — Encounter (HOSPITAL_COMMUNITY): Payer: Managed Care, Other (non HMO)

## 2018-12-16 NOTE — Progress Notes (Signed)
Virtual Visit via Video Note   This visit type was conducted due to national recommendations for restrictions regarding the COVID-19 Pandemic (e.g. social distancing) in an effort to limit this patient's exposure and mitigate transmission in our community.  Due to her co-morbid illnesses, this patient is at least at moderate risk for complications without adequate follow up.  This format is felt to be most appropriate for this patient at this time.  All issues noted in this document were discussed and addressed.  A limited physical exam was performed with this format.  Please refer to the patient's chart for her consent to telehealth for [facility].   Date:  12/18/2018   ID:  Angelica [name], DOB [birth year], MRN [id number]  Patient Location:Home Provider Location: Home  PCP:  Chesley Noon, MD  Cardiologist:  Dr Stanford Breed  Evaluation Performed:  Follow-Up Visit  Chief Complaint:  FU CAD  History of Present Illness:    FU coronary disease. Previously seen for chest pain. Stress echo was markedly abnormal with nonsustained ventricular tachycardia and ischemia in the anteroseptal wall and apex. Patient underwent cardiac catheterization in September of 2012 which revealed a 95% ostial LAD lesion. The patient had PCI with a drug-eluting stent. There was some jailing of the circumflex. Repeat cath at [facility] in Dec 2012 because of palpitations and chest pain revealed patency of LAD stent and she had no other significant CAD. Carotid Dopplers December 2012 showed 0-39% bilateral stenosis. 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 Patient has continued to have chest pain. She had repeat catheterization in May of 2014 which revealed normal left main, patent LAD stent, 50% mid LAD. There was a 30-40% ostial circumflex. Ejection fraction was 55%. Again had repeat cath 8/15 that showed no change (nonobstructive CAD).Nuclear study 7/18 showed EF  61 with no ischemia or infarction. Monitor 8/18 showed sinus with pacs, brief PAT and 6 beats NSVT. Repeat stress nuclear study ordered at last office visit but not performed as of yet.  Since last seenpatient continues to have occasional chest pains and pressure that is unchanged compared to previous.  Last seconds.  Occasional palpitations.  No syncope.  Patient feels her symptoms are worse with stress.  The patient does not have symptoms concerning for COVID-19 infection (fever, chills, cough, or new shortness of breath).    Past Medical History:  Diagnosis Date   ALLERGIC RHINITIS 03/02/2007   BREAST CYST, LEFT 11/23/2009   CAD S/P percutaneous coronary angioplasty 01/2011   a) Exertional Back Pain & High RISK ST) - 95% Ostial LAD --> PCI with Promus DES 3.0 mm x 12 mm; b) LHC 08/2012: LAD stent Patent , 50% mid LAD; c) Neg Myoview 03/2012 d). LHC 12/09/2013 40-50% mid-LAD no progression, medical therapy. e) Neg nuc 2016, 2018.   HLD (hyperlipidemia)    Incomplete left bundle branch block    REACTIVE AIRWAY DISEASE 10/21/2008   Past Surgical History:  Procedure Laterality Date   AUGMENTATION MAMMAPLASTY Bilateral 1990   BREAST BIOPSY     BREAST SURGERY     BILAT IMPLANTS   LEFT HEART CATHETERIZATION WITH CORONARY ANGIOGRAM N/A 09/10/2012   Procedure: LEFT HEART CATHETERIZATION WITH CORONARY ANGIOGRAM;  Surgeon: Sherren Mocha, MD;  Location: [facility] CATH LAB;  Service: Cardiovascular;  Laterality: N/A;   LEFT HEART CATHETERIZATION WITH CORONARY ANGIOGRAM N/A 12/09/2013   Procedure: LEFT HEART CATHETERIZATION WITH CORONARY ANGIOGRAM;  Surgeon: Leonie Man, MD;  Location:  [place] CATH LAB;  Service: Cardiovascular;  Laterality: N/A;   PERCUTANEOUS CORONARY STENT INTERVENTION (PCI-S)  01/2011   Promus Premier DES 3.0 mm x12 mm Ostial LAD (extends into LM with partial "jailing" of Cx)     Current Meds  Medication Sig   aspirin EC 81 MG tablet Take 81 mg by mouth every evening.    Coenzyme Q10 (CO Q 10 PO) Take by mouth.   ibuprofen (ADVIL,MOTRIN) 200 MG tablet Take 200-400 mg by mouth every 6 (six) hours as needed for pain or headache.   LORazepam (ATIVAN) 0.5 MG tablet Take 1 tablet (0.5 mg total) by mouth every 8 (eight) hours as needed for anxiety.   metoprolol succinate (TOPROL XL) 25 MG 24 hr tablet Take 1 tablet (25 mg total) by mouth daily.   nitroGLYCERIN (NITROSTAT) 0.4 MG SL tablet Place 1 tablet (0.4 mg total) under the tongue every 5 (five) minutes as needed for chest pain.   rosuvastatin (CRESTOR) 40 MG tablet TAKE 1 TABLET EVERY NIGHT     Allergies:   Prednisone   Social History   Tobacco Use   Smoking status: Never Smoker   Smokeless tobacco: Never Used  Substance Use Topics   Alcohol use: Not Currently    Comment: Occasional beer   Drug use: No     Family Hx: The patient's family history includes Alcohol abuse in an other family member; Diabetes in an other family member; Heart disease in an other family member; Hypertension in an other family member.  ROS:   Please see the history of present illness.    No Fever, chills  or productive cough All other systems reviewed and are negative.   Recent Lipid Panel Lab Results  Component Value Date/Time   CHOL 145 03/30/2014 08:18 AM   TRIG 48.0 03/30/2014 08:18 AM   TRIG 67 03/13/2006 11:12 AM   HDL 80.10 03/30/2014 08:18 AM   CHOLHDL 2 03/30/2014 08:18 AM   LDLCALC 55 03/30/2014 08:18 AM   LDLDIRECT 101.2 11/15/2008 09:43 AM    Wt Readings from Last 3 Encounters:  12/18/18 146 lb (66.2 kg)  09/08/18 145 lb (65.8 kg)  08/07/18 145 lb (65.8 kg)     Objective:    Vital Signs:  BP (!) 138/93    Pulse 75    Ht 5\' 2"  (1.575 m)    Wt 146 lb (66.2 kg)    LMP 02/21/2013    BMI 26.70 kg/m    VITAL SIGNS:  reviewed NAD Answers questions appropriately Normal affect Remainder of physical examination not performed (telehealth visit; coronavirus pandemic)  ASSESSMENT & PLAN:     1. Coronary artery disease-plan to continue with present regimen including aspirin and statin. 2. Chest pain-nuclear study arranged at last office visit and is scheduled for August 24.  We will await results.  Difficult situation as patient does have disease but has had atypical symptoms since PCI with negative evaluation. 3. Hyperlipidemia-continue statin.  Lipids and liver monitored by primary care. 4. Palpitations-symptoms are reasonably well controlled today.  Continue present dose of beta-blocker. 5. Hypertension-patient's blood pressure is elevated; however she states typically stress related.  When stress improves her blood pressure improves.  We will follow and adjust regimen as needed.  COVID-19 Education: The importance of social distancing was discussed today.  Time:   Today, I have spent 18 minutes with the patient with telehealth technology discussing the above problems.     Medication Adjustments/Labs and Tests Ordered: Current medicines are  reviewed at length with the patient today.  Concerns regarding medicines are outlined above.   Tests Ordered: No orders of the defined types were placed in this encounter.   Medication Changes: No orders of the defined types were placed in this encounter.   Follow Up:  Virtual Visit or In Person in 6 month(s)  Signed, Kirk Ruths, MD  12/18/2018 10:48 AM    [place]

## 2018-12-18 ENCOUNTER — Other Ambulatory Visit: Payer: Self-pay

## 2018-12-18 ENCOUNTER — Telehealth (INDEPENDENT_AMBULATORY_CARE_PROVIDER_SITE_OTHER): Payer: Managed Care, Other (non HMO) | Admitting: Cardiology

## 2018-12-18 VITALS — BP 138/93 | HR 75 | Ht 62.0 in | Wt 146.0 lb

## 2018-12-18 DIAGNOSIS — I2510 Atherosclerotic heart disease of native coronary artery without angina pectoris: Secondary | ICD-10-CM

## 2018-12-18 DIAGNOSIS — R072 Precordial pain: Secondary | ICD-10-CM

## 2018-12-18 DIAGNOSIS — E7800 Pure hypercholesterolemia, unspecified: Secondary | ICD-10-CM | POA: Diagnosis not present

## 2018-12-18 DIAGNOSIS — I10 Essential (primary) hypertension: Secondary | ICD-10-CM

## 2018-12-18 NOTE — Patient Instructions (Signed)
Medication Instructions:  NO CHANGE If you need a refill on your cardiac medications before your next appointment, please call your pharmacy.   Lab work: If you have labs (blood work) drawn today and your tests are completely normal, you will receive your results only by: . MyChart Message (if you have MyChart) OR . A paper copy in the mail If you have any lab test that is abnormal or we need to change your treatment, we will call you to review the results.  Follow-Up: At [facility], you and your health needs are our priority.  As part of our continuing mission to provide you with exceptional heart care, we have created designated Provider Care Teams.  These Care Teams include your primary Cardiologist (physician) and Advanced Practice Providers (APPs -  Physician Assistants and Nurse Practitioners) who all work together to provide you with the care you need, when you need it. You will need a follow up appointment in 12 months.  Please call our office 2 months in advance to schedule this appointment.  You may see Brian Crenshaw, MD or one of the following Advanced Practice Providers on your designated Care Team:   Luke Kilroy, PA-C Krista Kroeger, PA-C . Callie Goodrich, PA-C     

## 2018-12-24 ENCOUNTER — Telehealth (HOSPITAL_COMMUNITY): Payer: Self-pay | Admitting: *Deleted

## 2018-12-24 NOTE — Telephone Encounter (Signed)
Left message on voicemail per DPR in reference to upcoming appointment scheduled on 12/28/18 at 10:30 with detailed instructions given per Myocardial Perfusion Study Information Sheet for the test. LM to arrive 15 minutes early, and that it is imperative to arrive on time for appointment to keep from having the test rescheduled. If you need to cancel or reschedule your appointment, please call the office within 24 hours of your appointment. Failure to do so may result in a cancellation of your appointment, and a $50 no show fee. Phone number given for call back for any questions.

## 2018-12-28 ENCOUNTER — Other Ambulatory Visit: Payer: Self-pay

## 2018-12-28 ENCOUNTER — Ambulatory Visit (HOSPITAL_COMMUNITY): Payer: Managed Care, Other (non HMO) | Attending: Cardiology

## 2018-12-28 VITALS — Ht 62.0 in | Wt 146.0 lb

## 2018-12-28 DIAGNOSIS — R072 Precordial pain: Secondary | ICD-10-CM

## 2018-12-28 DIAGNOSIS — R110 Nausea: Secondary | ICD-10-CM | POA: Diagnosis present

## 2018-12-28 LAB — MYOCARDIAL PERFUSION IMAGING
LV dias vol: 52 mL (ref 46–106)
LV sys vol: 22 mL
Peak HR: 125 {beats}/min
Rest HR: 77 {beats}/min
SDS: 2
SRS: 5
SSS: 7
TID: 1.07

## 2018-12-28 MED ORDER — TECHNETIUM TC 99M TETROFOSMIN IV KIT
10.2000 | PACK | Freq: Once | INTRAVENOUS | Status: AC | PRN
  Administered 2018-12-28: 10.2 via INTRAVENOUS
  Filled 2018-12-28: qty 11

## 2018-12-28 MED ORDER — TECHNETIUM TC 99M TETROFOSMIN IV KIT
33.0000 | PACK | Freq: Once | INTRAVENOUS | Status: AC | PRN
  Administered 2018-12-28: 33 via INTRAVENOUS
  Filled 2018-12-28: qty 33

## 2018-12-28 MED ORDER — REGADENOSON 0.4 MG/5ML IV SOLN
0.4000 mg | Freq: Once | INTRAVENOUS | Status: AC
  Administered 2018-12-28: 0.4 mg via INTRAVENOUS

## 2018-12-28 MED ORDER — AMINOPHYLLINE 25 MG/ML IV SOLN
75.0000 mg | Freq: Once | INTRAVENOUS | Status: AC
  Administered 2018-12-28: 75 mg via INTRAVENOUS

## 2019-04-14 ENCOUNTER — Other Ambulatory Visit: Payer: Self-pay

## 2019-04-14 NOTE — Progress Notes (Signed)
63 y.o. [place] Married Caucasian female here for annual exam.    Doing "Scoot" for lower extremity exercise.  Does a lot of sitting.    Working hard during the pandemic, early am until late night.   Has stress and extreme anxiety.  Difficulty taking a day off.  Not depressed. Denies suicidal ideation.  Used Lorazepam in past.  PCP:  Anastasia Pall, MD   Patient's last menstrual period was 02/21/2013.           Sexually active: Yes.    The current method of family planning is post menopausal status.    Exercising: Yes.    30 minutes of eliptical every other day Smoker:  no  Health Maintenance: Pap:  04-07-17 Neg:Neg HR HPV,02-21-14 Neg:Neg HR HPV,10-05-11 Neg History of abnormal Pap:  no MMG: 04-21-18 Diag.Bil.w/implant/Neg/density C/BiRads1 Colonoscopy:12-14-07 normal;next due 2019--patient knows needs to schedule.  States she did an IFOB through her work at [employer], and the results are pending.  BMD: 2010  Result :normal per patient TDaP:  2012 Gardasil:   no [id number] Hep C:Neg 2016 Screening Labs:  PCP. Flu vaccine:  Discussed.    reports that she has never smoked. She has never used smokeless tobacco. She reports previous alcohol use. She reports that she does not use drugs.  Past Medical History:  Diagnosis Date  . ALLERGIC RHINITIS 03/02/2007  . BREAST CYST, LEFT 11/23/2009  . CAD S/P percutaneous coronary angioplasty 01/2011   a) Exertional Back Pain & High RISK ST) - 95% Ostial LAD --> PCI with Promus DES 3.0 mm x 12 mm; b) LHC 08/2012: LAD stent Patent , 50% mid LAD; c) Neg Myoview 03/2012 d). LHC 12/09/2013 40-50% mid-LAD no progression, medical therapy. e) Neg nuc 2016, 2018.  Marland Kitchen HLD (hyperlipidemia)   . Incomplete left bundle branch block   . REACTIVE AIRWAY DISEASE 10/21/2008    Past Surgical History:  Procedure Laterality Date  . AUGMENTATION MAMMAPLASTY Bilateral 1990  . BREAST BIOPSY    . BREAST SURGERY     BILAT IMPLANTS  . LEFT HEART CATHETERIZATION  WITH CORONARY ANGIOGRAM N/A 09/10/2012   Procedure: LEFT HEART CATHETERIZATION WITH CORONARY ANGIOGRAM;  Surgeon: Sherren Mocha, MD;  Location: [facility] CATH LAB;  Service: Cardiovascular;  Laterality: N/A;  . LEFT HEART CATHETERIZATION WITH CORONARY ANGIOGRAM N/A 12/09/2013   Procedure: LEFT HEART CATHETERIZATION WITH CORONARY ANGIOGRAM;  Surgeon: Leonie Man, MD;  Location: [facility] CATH LAB;  Service: Cardiovascular;  Laterality: N/A;  . PERCUTANEOUS CORONARY STENT INTERVENTION (PCI-S)  01/2011   Promus Premier DES 3.0 mm x12 mm Ostial LAD (extends into LM with partial "jailing" of Cx)    Current Outpatient Medications  Medication Sig Dispense Refill  . aspirin EC 81 MG tablet Take 81 mg by mouth every evening.    . Coenzyme Q10 (CO Q 10 PO) Take by mouth.    Marland Kitchen ibuprofen (ADVIL,MOTRIN) 200 MG tablet Take 200-400 mg by mouth every 6 (six) hours as needed for pain or headache.    . metoprolol succinate (TOPROL XL) 25 MG 24 hr tablet Take 1 tablet (25 mg total) by mouth daily. 90 tablet 3  . nitroGLYCERIN (NITROSTAT) 0.4 MG SL tablet Place 1 tablet (0.4 mg total) under the tongue every 5 (five) minutes as needed for chest pain. 25 tablet 3  . rosuvastatin (CRESTOR) 40 MG tablet TAKE 1 TABLET EVERY NIGHT 90 tablet 1   No current facility-administered medications for this visit.    Family History  Problem Relation Age  of Onset  . Alcohol abuse Other   . Diabetes Other   . Hypertension Other   . Heart disease Other     Review of Systems  Constitutional: Positive for fatigue.       Insomnia--due to her job    Exam:   BP 122/62   Pulse 70   Temp (!) 97.3 F (36.3 C) (Temporal)   Resp 16   Ht 5' 1.5" (1.562 m)   Wt 148 lb 9.6 oz (67.4 kg)   LMP 02/21/2013   BMI 27.62 kg/m     General appearance: alert, cooperative and appears stated age Head: normocephalic, without obvious abnormality, atraumatic Neck: no adenopathy, supple, symmetrical, trachea midline and thyroid normal to inspection  and palpation Lungs: clear to auscultation bilaterally Breasts: normal appearance, implants present, no masses or tenderness, No nipple retraction or dimpling, No nipple discharge or bleeding, No axillary adenopathy Heart: regular rate and rhythm Abdomen: soft, non-tender; no masses, no organomegaly Extremities: extremities normal, atraumatic, no cyanosis or edema Skin: skin color, texture, turgor normal. No rashes or lesions Lymph nodes: cervical, supraclavicular, and axillary nodes normal. Neurologic: grossly normal  Pelvic: External genitalia:  no lesions              No abnormal inguinal nodes palpated.              Urethra:  normal appearing urethra with no masses, tenderness or lesions              Bartholins and Skenes: normal                 Vagina: normal appearing vagina with normal color and discharge, no lesions              Cervix: no lesions              Pap taken: No. Bimanual Exam:  Uterus:  normal size, contour, position, consistency, mobility, non-tender              Adnexa: no mass, fullness, tenderness              Rectal exam: Yes.  .  Confirms.              Anus:  normal sphincter tone, no lesions  Chaperone was present for exam.  Assessment:   Well woman visit with normal exam. Bilateral breast implants. Anxiety and stress.  Hx CAD. LBBB.  Plan: Mammogram screening discussed. Self breast awareness reviewed. Pap and HR HPV 2023.  New guidelines discussed. Guidelines for Calcium, Vitamin D, regular exercise program including cardiovascular and weight bearing exercise. Start Paxil 10 mg.  Risks and benefits reviewed.  Serotonin syndrome discussed.  FU in 6 weeks.  Follow up annually and prn.   After visit summary provided.

## 2019-04-15 ENCOUNTER — Encounter: Payer: Self-pay | Admitting: Obstetrics and Gynecology

## 2019-04-15 ENCOUNTER — Ambulatory Visit (INDEPENDENT_AMBULATORY_CARE_PROVIDER_SITE_OTHER): Payer: Managed Care, Other (non HMO) | Admitting: Obstetrics and Gynecology

## 2019-04-15 VITALS — BP 122/62 | HR 70 | Temp 97.3°F | Resp 16 | Ht 61.5 in | Wt 148.6 lb

## 2019-04-15 DIAGNOSIS — F439 Reaction to severe stress, unspecified: Secondary | ICD-10-CM | POA: Diagnosis not present

## 2019-04-15 DIAGNOSIS — Z01419 Encounter for gynecological examination (general) (routine) without abnormal findings: Secondary | ICD-10-CM | POA: Diagnosis not present

## 2019-04-15 MED ORDER — PAROXETINE HCL 10 MG PO TABS: 10.0000 mg | ORAL_TABLET | ORAL | 1 refills | Status: DC

## 2019-05-17 ENCOUNTER — Telehealth: Payer: Self-pay | Admitting: Obstetrics and Gynecology

## 2019-05-17 ENCOUNTER — Other Ambulatory Visit: Payer: Self-pay | Admitting: Obstetrics and Gynecology

## 2019-05-17 DIAGNOSIS — Z1231 Encounter for screening mammogram for malignant neoplasm of breast: Secondary | ICD-10-CM

## 2019-05-17 NOTE — Telephone Encounter (Signed)
Patient is having some right breast pain and is asking for an order for a diagnostic mammogram.

## 2019-05-17 NOTE — Telephone Encounter (Signed)
Patient returned a call to triage. 

## 2019-05-17 NOTE — Telephone Encounter (Signed)
Left message for pt to call back to schedule OV for breast pain. To speak with triage RN.

## 2019-05-17 NOTE — Telephone Encounter (Signed)
Patient returning call to triage nurse.

## 2019-05-17 NOTE — Telephone Encounter (Signed)
Left message to call Sharee Pimple, RN at [facility].    Last screening MMG at [facility] 04/21/18 Last AEX 04/15/19

## 2019-05-17 NOTE — Telephone Encounter (Signed)
Spoke to pt. Pt states having right breast pain x 2 months.  Pt states wasn't sure if she mentioned this at her AEX 04/15/19 with Dr Quincy Simmonds. Pt was calling [facility] to schedule screening MMG and was told to call our office to get dx right MMG order. Pt scheduled OV with Dr Quincy Simmonds for right breast pain on 05/19/2019 at 130pm. Pt agreeable.   Will route to Dr Quincy Simmonds for review and will close encounter.

## 2019-05-18 ENCOUNTER — Other Ambulatory Visit: Payer: Self-pay

## 2019-05-19 ENCOUNTER — Ambulatory Visit: Payer: Managed Care, Other (non HMO) | Admitting: Obstetrics and Gynecology

## 2019-05-19 ENCOUNTER — Encounter: Payer: Self-pay | Admitting: Obstetrics and Gynecology

## 2019-05-19 ENCOUNTER — Telehealth: Payer: Self-pay | Admitting: Obstetrics and Gynecology

## 2019-05-19 VITALS — BP 142/80 | HR 80 | Temp 96.7°F | Ht 61.5 in | Wt 148.6 lb

## 2019-05-19 DIAGNOSIS — Z566 Other physical and mental strain related to work: Secondary | ICD-10-CM

## 2019-05-19 DIAGNOSIS — N644 Mastodynia: Secondary | ICD-10-CM

## 2019-05-19 MED ORDER — SERTRALINE HCL 50 MG PO TABS: 50.0000 mg | ORAL_TABLET | Freq: Every day | ORAL | 1 refills | Status: DC

## 2019-05-19 NOTE — Telephone Encounter (Signed)
Order placed for bilateral Dx MMG with implants and right breast [place], if needed, at [facility].   Patient placed in MMG hold.  Spoke with Ena Dawley at [facility]. Patient scheduled for 06/04/19, arrive at 9:20am, appt at 9:40am.   Spoke with patient, advised of appt as seen above. Patient verbalizes understanding and is agreeable to date and time.   Routing to provider for final review. Patient is agreeable to disposition. Will close encounter.

## 2019-05-19 NOTE — Progress Notes (Signed)
GYNECOLOGY  VISIT   HPI: 64 y.o.   Married  Caucasian  female   [id number] with Patient's last menstrual period was 02/21/2013. here for sharp stabbing pain in right breast    Pain is a flash and then goes away.  Starts in the axilla, and then radiates to her breast.  She has chronic right shoulder pain.  No breast lump.  No increased activity, no trauma, and no recent vaccines.  Does a lot of typing.   She did not start Paxil at her visit on 04/15/19.  She is worried about weight gain.  She is waking up a lot at night thinking about work.  She would like to see a Social worker.  Feels like her work stress is "killing her."  Hx CAD.   GYNECOLOGIC HISTORY: Patient's last menstrual period was 02/21/2013. Contraception:  PMP Menopausal hormone therapy: none Last mammogram: 04-21-18 Implants/Neg/density C/BiRads1 Last pap smear:04-07-17 Neg:Neg HR HPV,02-21-14 Neg:Neg HR HPV,10-05-11 Neg         OB History    Gravida  0   Para  0   Term  0   Preterm  0   AB  0   Living  0     SAB  0   TAB  0   Ectopic  0   Multiple  0   Live Births                 Patient Active Problem List   Diagnosis Date Noted  . Incomplete left bundle branch block   . HLD (hyperlipidemia)   . Chest pain 01/03/2015  . Chest wall pain, chronic 04/05/2014  . Shoulder pain, right 12/08/2013  . Atherosclerotic heart disease of native coronary artery with unstable angina pectoris ([place]) 12/08/2013  . Peripheral vascular disease ([place]) 05/16/2011  . Palpitations 05/06/2011  . GERD (gastroesophageal reflux disease) 05/06/2011  . Chronic epigastric pain 04/08/2011  . CAD S/P percutaneous coronary angioplasty: Ostial LAD Promus DES 3.0 mm x 12 mm 02/13/2011  . Hyperlipidemia with target LDL less than 70 02/13/2011  . BREAST CYST, LEFT 11/23/2009  . 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 ALLERGIC RHINITIS 03/02/2007    Past Medical History:  Diagnosis Date  . ALLERGIC RHINITIS 03/02/2007  .  BREAST CYST, LEFT 11/23/2009  . CAD S/P percutaneous coronary angioplasty 01/2011   a) Exertional Back Pain & High RISK ST) - 95% Ostial LAD --> PCI with Promus DES 3.0 mm x 12 mm; b) LHC 08/2012: LAD stent Patent , 50% mid LAD; c) Neg Myoview 03/2012 d). LHC 12/09/2013 40-50% mid-LAD no progression, medical therapy. e) Neg nuc 2016, 2018.  Marland Kitchen HLD (hyperlipidemia)   . Incomplete left bundle branch block   . REACTIVE AIRWAY DISEASE 10/21/2008    Past Surgical History:  Procedure Laterality Date  . AUGMENTATION MAMMAPLASTY Bilateral 1990  . BREAST BIOPSY    . BREAST SURGERY     BILAT IMPLANTS  . LEFT HEART CATHETERIZATION WITH CORONARY ANGIOGRAM N/A 09/10/2012   Procedure: LEFT HEART CATHETERIZATION WITH CORONARY ANGIOGRAM;  Surgeon: Sherren Mocha, MD;  Location: [facility] CATH LAB;  Service: Cardiovascular;  Laterality: N/A;  . LEFT HEART CATHETERIZATION WITH CORONARY ANGIOGRAM N/A 12/09/2013   Procedure: LEFT HEART CATHETERIZATION WITH CORONARY ANGIOGRAM;  Surgeon: Leonie Man, MD;  Location: [facility] CATH LAB;  Service: Cardiovascular;  Laterality: N/A;  . PERCUTANEOUS CORONARY STENT INTERVENTION (PCI-S)  01/2011   Promus Premier DES 3.0 mm x12 mm Ostial LAD (extends into LM with partial "jailing"  of Cx)    Current Outpatient Medications  Medication Sig Dispense Refill  . aspirin EC 81 MG tablet Take 81 mg by mouth every evening.    . Coenzyme Q10 (CO Q 10 PO) Take by mouth.    Marland Kitchen ibuprofen (ADVIL,MOTRIN) 200 MG tablet Take 200-400 mg by mouth every 6 (six) hours as needed for pain or headache.    . metoprolol succinate (TOPROL XL) 25 MG 24 hr tablet Take 1 tablet (25 mg total) by mouth daily. 90 tablet 3  . nitroGLYCERIN (NITROSTAT) 0.4 MG SL tablet Place 1 tablet (0.4 mg total) under the tongue every 5 (five) minutes as needed for chest pain. 25 tablet 3  . rosuvastatin (CRESTOR) 40 MG tablet TAKE 1 TABLET EVERY NIGHT 90 tablet 1  . PARoxetine (PAXIL) 10 MG tablet Take 1 tablet (10 mg total) by mouth  every morning. (Patient not taking: Reported on 05/19/2019) 30 tablet 1   No current facility-administered medications for this visit.     ALLERGIES: Prednisone  Family History  Problem Relation Age of Onset  . Alcohol abuse Other   . Diabetes Other   . Hypertension Other   . Heart disease Other     Social History   Socioeconomic History  . Marital status: Married    Spouse name: Not on file  . Number of children: Not on file  . Years of education: Not on file  . Highest education level: Not on file  Occupational History    Employer: [employer]    Comment: Health systems manager  Tobacco Use  . Smoking status: Never Smoker  . Smokeless tobacco: Never Used  Substance and Sexual Activity  . Alcohol use: Not Currently    Comment: Occasional beer  . Drug use: No  . Sexual activity: Yes    Partners: Male    Birth control/protection: Post-menopausal  Other Topics Concern  . Not on file  Social History Narrative   Occupation:    Never Smoked   Alcohol use- no   Married   Drug use- no   Regular Exercise- yes    Social Determinants of Radio broadcast assistant Strain:   . Difficulty of Paying Living Expenses: Not on file  Food Insecurity:   . Worried About Charity fundraiser in the Last Year: Not on file  . Ran Out of Food in the Last Year: Not on file  Transportation Needs:   . Lack of Transportation (Medical): Not on file  . Lack of Transportation (Non-Medical): Not on file  Physical Activity:   . Days of Exercise per Week: Not on file  . Minutes of Exercise per Session: Not on file  Stress:   . Feeling of Stress : Not on file  Social Connections:   . Frequency of Communication with Friends and Family: Not on file  . Frequency of Social Gatherings with Friends and Family: Not on file  . Attends Religious Services: Not on file  . Active Member of Clubs or Organizations: Not on file  . Attends Archivist Meetings: Not on file  . Marital  Status: Not on file  Intimate Partner Violence:   . Fear of Current or Ex-Partner: Not on file  . Emotionally Abused: Not on file  . Physically Abused: Not on file  . Sexually Abused: Not on file    Review of Systems  All other systems reviewed and are negative.   PHYSICAL EXAMINATION:    BP (!) 142/80  Pulse 80   Temp (!) 96.7 F (35.9 C) (Temporal)   Ht 5' 1.5" (1.562 m)   Wt 148 lb 9.6 oz (67.4 kg)   LMP 02/21/2013   BMI 27.62 kg/m     General appearance: alert, cooperative and appears stated age   Breasts: left - normal appearance, implant present, no masses or tenderness, No nipple retraction or dimpling, No nipple discharge or bleeding, No axillary adenopathy. Right - normal appearance, implant present with subtle fullness at 9:00 with mild tenderness,  No nipple retraction or dimpling, No nipple discharge or bleeding, No axillary adenopathy.   Chaperone was present for exam.  ASSESSMENT  Right breast pain and potential mass.  Bilateral implants.  Work stress.   PLAN  Will proceed with bilateral dx mammogram and right breast US at [facility].  Start Zoloft 50 mg daily. #30, RF none.  I gave her brochure for [place] and list of other resources.  FU in 6 weeks.    An After Visit Summary was printed and given to the patient.  _15_____ minutes face to face time of which over 50% was spent in counseling.

## 2019-05-19 NOTE — Telephone Encounter (Signed)
Please schedule bilateral dx mammogram and right breast US for right breast pain. She has bilateral implants, and her exam suggests some fullness in the right side at 9:00, but this is nonspecific.  [facility]  Her preference is for Fridays.

## 2019-06-04 ENCOUNTER — Other Ambulatory Visit: Payer: Managed Care, Other (non HMO)

## 2019-06-21 ENCOUNTER — Other Ambulatory Visit: Payer: Managed Care, Other (non HMO)

## 2019-07-01 ENCOUNTER — Ambulatory Visit: Payer: Managed Care, Other (non HMO) | Admitting: Obstetrics and Gynecology

## 2019-07-13 ENCOUNTER — Ambulatory Visit: Payer: Managed Care, Other (non HMO)

## 2019-07-13 ENCOUNTER — Other Ambulatory Visit: Payer: Self-pay

## 2019-07-13 ENCOUNTER — Ambulatory Visit
Admission: RE | Admit: 2019-07-13 | Discharge: 2019-07-13 | Disposition: A | Discharge status: Home / Self Care | Payer: Managed Care, Other (non HMO) | Source: Ambulatory Visit | Attending: Obstetrics and Gynecology | Admitting: Obstetrics and Gynecology

## 2019-07-13 DIAGNOSIS — N644 Mastodynia: Secondary | ICD-10-CM

## 2019-07-20 ENCOUNTER — Other Ambulatory Visit: Payer: Self-pay

## 2019-07-20 ENCOUNTER — Encounter: Payer: Managed Care, Other (non HMO) | Admitting: Obstetrics and Gynecology

## 2019-07-20 NOTE — Progress Notes (Signed)
Appointment cancelled

## 2019-07-27 ENCOUNTER — Telehealth (INDEPENDENT_AMBULATORY_CARE_PROVIDER_SITE_OTHER): Payer: Managed Care, Other (non HMO) | Admitting: Obstetrics and Gynecology

## 2019-07-27 ENCOUNTER — Encounter: Payer: Self-pay | Admitting: Obstetrics and Gynecology

## 2019-07-27 VITALS — Ht 61.5 in

## 2019-07-27 DIAGNOSIS — F32A Depression, unspecified: Secondary | ICD-10-CM

## 2019-07-27 DIAGNOSIS — F419 Anxiety disorder, unspecified: Secondary | ICD-10-CM | POA: Diagnosis not present

## 2019-07-27 DIAGNOSIS — F329 Major depressive disorder, single episode, unspecified: Secondary | ICD-10-CM

## 2019-07-27 NOTE — Progress Notes (Signed)
GYNECOLOGY  VISIT   HPI: 64 y.o.   Married  Caucasian  female   G0P0000 with Patient's last menstrual period was 02/21/2013.   here for MyChart video for medication follow up.   Started at 5:00 pm.  Ended at 5:22. She gives permission for the visit. Audio but not video My Chart visit.  She is properly identified. Husband is with her at their home.  I am in my office.   She was hoping to avoid medication for her extreme work related stress.  Started taking Zoloft 50 mg one week ago.   She also had an appointment with a psychiatric nurse, but she is not in her insurance network. Carolann Littler.  She received an Rx for Clonazepam 0.5 mg, 1 po q hs and 1/2 tab during the day as needed. She is using this every other night.  She has done TeleDoc through her work and discussed with a Lexicographer who recommended time off from work.      She has an appointment with [place] couselor.    She is feeling better.  Not feeling any major joy.  Able to sleep.  Eating every day.  Not suicidal.  Very stressful work.  She is continuing at work.  She did take 1.5 days off.   Husband is needing surgery.   GYNECOLOGIC HISTORY: Patient's last menstrual period was 02/21/2013. Contraception: PMP Menopausal hormone therapy:  none Last mammogram: 07-13-19 Bil.Implants/Diag.Bil/Neg/density C/BiRads2/screening 23yr. Last pap smear: 04-07-17 Neg:Neg HR HPV,02-21-14 Neg:Neg HR HPV,10-05-11 Neg         OB History    Gravida  0   Para  0   Term  0   Preterm  0   AB  0   Living  0     SAB  0   TAB  0   Ectopic  0   Multiple  0   Live Births                 Patient Active Problem List   Diagnosis Date Noted  . Incomplete left bundle branch block   . HLD (hyperlipidemia)   . Chest pain 01/03/2015  . Chest wall pain, chronic 04/05/2014  . Shoulder pain, right 12/08/2013  . Atherosclerotic heart disease of native coronary artery with unstable angina pectoris ([place]) 12/08/2013   . Peripheral vascular disease ([place]) 05/16/2011  . Palpitations 05/06/2011  . GERD (gastroesophageal reflux disease) 05/06/2011  . Chronic epigastric pain 04/08/2011  . CAD S/P percutaneous coronary angioplasty: Ostial LAD Promus DES 3.0 mm x 12 mm 02/13/2011  . Hyperlipidemia with target LDL less than 70 02/13/2011  . BREAST CYST, LEFT 11/23/2009  . 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 ALLERGIC RHINITIS 03/02/2007    Past Medical History:  Diagnosis Date  . ALLERGIC RHINITIS 03/02/2007  . BREAST CYST, LEFT 11/23/2009  . CAD S/P percutaneous coronary angioplasty 01/2011   a) Exertional Back Pain & High RISK ST) - 95% Ostial LAD --> PCI with Promus DES 3.0 mm x 12 mm; b) LHC 08/2012: LAD stent Patent , 50% mid LAD; c) Neg Myoview 03/2012 d). LHC 12/09/2013 40-50% mid-LAD no progression, medical therapy. e) Neg nuc 2016, 2018.  Marland Kitchen HLD (hyperlipidemia)   . Incomplete left bundle branch block   . REACTIVE AIRWAY DISEASE 10/21/2008    Past Surgical History:  Procedure Laterality Date  . AUGMENTATION MAMMAPLASTY Bilateral 1990  . BREAST BIOPSY    . BREAST SURGERY     BILAT IMPLANTS  .  LEFT HEART CATHETERIZATION WITH CORONARY ANGIOGRAM N/A 09/10/2012   Procedure: LEFT HEART CATHETERIZATION WITH CORONARY ANGIOGRAM;  Surgeon: Sherren Mocha, MD;  Location: [facility] CATH LAB;  Service: Cardiovascular;  Laterality: N/A;  . LEFT HEART CATHETERIZATION WITH CORONARY ANGIOGRAM N/A 12/09/2013   Procedure: LEFT HEART CATHETERIZATION WITH CORONARY ANGIOGRAM;  Surgeon: Leonie Man, MD;  Location: [facility] CATH LAB;  Service: Cardiovascular;  Laterality: N/A;  . PERCUTANEOUS CORONARY STENT INTERVENTION (PCI-S)  01/2011   Promus Premier DES 3.0 mm x12 mm Ostial LAD (extends into LM with partial "jailing" of Cx)    Current Outpatient Medications  Medication Sig Dispense Refill  . aspirin EC 81 MG tablet Take 81 mg by mouth every evening.    . clonazePAM (KLONOPIN) 0.5 MG tablet TAKE 1 TABLET EVERY NIGHT AT BEDTIME,  AND 1/2 TAB AS NEEDED DURING THE DAY    . Coenzyme Q10 (CO Q 10 PO) Take by mouth.    Marland Kitchen ibuprofen (ADVIL,MOTRIN) 200 MG tablet Take 200-400 mg by mouth every 6 (six) hours as needed for pain or headache.    . metoprolol succinate (TOPROL XL) 25 MG 24 hr tablet Take 1 tablet (25 mg total) by mouth daily. 90 tablet 3  . nitroGLYCERIN (NITROSTAT) 0.4 MG SL tablet Place 1 tablet (0.4 mg total) under the tongue every 5 (five) minutes as needed for chest pain. 25 tablet 3  . rosuvastatin (CRESTOR) 40 MG tablet TAKE 1 TABLET EVERY NIGHT 90 tablet 1  . sertraline (ZOLOFT) 50 MG tablet Take 1 tablet (50 mg total) by mouth daily. 30 tablet 1   No current facility-administered medications for this visit.     ALLERGIES: Prednisone  Family History  Problem Relation Age of Onset  . Alcohol abuse Other   . Diabetes Other   . Hypertension Other   . Heart disease Other     Social History   Socioeconomic History  . Marital status: Married    Spouse name: Not on file  . Number of children: Not on file  . Years of education: Not on file  . Highest education level: Not on file  Occupational History    Employer: [employer]    Comment: Health systems manager  Tobacco Use  . Smoking status: Never Smoker  . Smokeless tobacco: Never Used  Substance and Sexual Activity  . Alcohol use: Not Currently    Comment: Occasional beer  . Drug use: No  . Sexual activity: Yes    Partners: Male    Birth control/protection: Post-menopausal  Other Topics Concern  . Not on file  Social History Narrative   Occupation:    Never Smoked   Alcohol use- no   Married   Drug use- no   Regular Exercise- yes    Social Determinants of Radio broadcast assistant Strain:   . Difficulty of Paying Living Expenses:   Food Insecurity:   . Worried About Charity fundraiser in the Last Year:   . Arboriculturist in the Last Year:   Transportation Needs:   . Film/video editor (Medical):   Marland Kitchen Lack of  Transportation (Non-Medical):   Physical Activity:   . Days of Exercise per Week:   . Minutes of Exercise per Session:   Stress:   . Feeling of Stress :   Social Connections:   . Frequency of Communication with Friends and Family:   . Frequency of Social Gatherings with Friends and Family:   . Attends Religious Services:   .  Active Member of Clubs or Organizations:   . Attends Archivist Meetings:   Marland Kitchen Marital Status:   Intimate Partner Violence:   . Fear of Current or Ex-Partner:   . Emotionally Abused:   Marland Kitchen Physically Abused:   . Sexually Abused:     Review of Systems  PHYSICAL EXAMINATION:    Ht 5' 1.5" (1.562 m)   LMP 02/21/2013   BMI 27.62 kg/m     General appearance: alert, cooperative and appears stated age   ASSESSMENT  Depression and anxiety.   PLAN  Continue Zoloft.  Clonazepam prn.  I do recommend she establish care with a psychologist.  Will do a video chat visit first week in May, 2021 for medication follow up.  An After Visit Summary was printed and given to the patient.  __22____ minutes face to face time of which over 50% was spent in counseling.

## 2019-07-30 ENCOUNTER — Ambulatory Visit (INDEPENDENT_AMBULATORY_CARE_PROVIDER_SITE_OTHER): Payer: 59 | Admitting: Psychology

## 2019-07-30 ENCOUNTER — Telehealth: Payer: Self-pay | Admitting: Obstetrics and Gynecology

## 2019-07-30 DIAGNOSIS — F4323 Adjustment disorder with mixed anxiety and depressed mood: Secondary | ICD-10-CM

## 2019-07-30 NOTE — Telephone Encounter (Signed)
Spoke with patient. Follow up MyChart visit scheduled for 09/16/2019 at 11:30 am. Patient is agreeable to date and time.  Routing to provider and will close encounter.

## 2019-07-30 NOTE — Telephone Encounter (Signed)
Please facilitate a follow up visit with me the beginning of May.  Patient may prefer a My Chart video visit.

## 2019-08-05 ENCOUNTER — Ambulatory Visit (INDEPENDENT_AMBULATORY_CARE_PROVIDER_SITE_OTHER): Payer: 59 | Admitting: Psychology

## 2019-08-05 DIAGNOSIS — F4323 Adjustment disorder with mixed anxiety and depressed mood: Secondary | ICD-10-CM | POA: Diagnosis not present

## 2019-08-13 ENCOUNTER — Ambulatory Visit (INDEPENDENT_AMBULATORY_CARE_PROVIDER_SITE_OTHER): Payer: 59 | Admitting: Psychology

## 2019-08-13 DIAGNOSIS — F4323 Adjustment disorder with mixed anxiety and depressed mood: Secondary | ICD-10-CM | POA: Diagnosis not present

## 2019-09-01 ENCOUNTER — Ambulatory Visit (INDEPENDENT_AMBULATORY_CARE_PROVIDER_SITE_OTHER): Payer: 59 | Admitting: Psychology

## 2019-09-01 DIAGNOSIS — F4323 Adjustment disorder with mixed anxiety and depressed mood: Secondary | ICD-10-CM | POA: Diagnosis not present

## 2019-09-08 ENCOUNTER — Telehealth: Payer: Self-pay | Admitting: *Deleted

## 2019-09-08 NOTE — Telephone Encounter (Signed)
Patient is in MMG hold.  Patient seen in office for right breast pain with bilateral implants on 05/19/19. Dx IMG was scheduled for 06/04/19. IMG was cancelled, r/s to 07/13/19.   07/13/19: Bilateral Dx MMG w/ implants  IMPRESSION: No mammographic findings suspicious for malignancy.  RECOMMENDATION: Bilateral screening mammogram in 1 year.  I have discussed the findings, causes of breast pain and recommendations with the patient. If applicable, a reminder letter will be sent to the patient regarding the next appointment.  BI-RADS CATEGORY  2: Benign.   Dr. Quincy Simmonds -ok to remove from [facility] hold?

## 2019-09-12 NOTE — Telephone Encounter (Signed)
Please remove from mammogram hold and return to routine screening.

## 2019-09-14 NOTE — Telephone Encounter (Signed)
Patient removed from MMG hold.   Encounter closed.  

## 2019-09-15 NOTE — Progress Notes (Signed)
No visit performed on this date.  Visit cancelled and rescheduled.

## 2019-09-16 ENCOUNTER — Encounter: Payer: Managed Care, Other (non HMO) | Admitting: Obstetrics and Gynecology

## 2019-09-16 ENCOUNTER — Other Ambulatory Visit: Payer: Self-pay

## 2019-09-16 ENCOUNTER — Telehealth: Payer: Self-pay | Admitting: Obstetrics and Gynecology

## 2019-09-16 NOTE — Telephone Encounter (Signed)
I see office visit for 09/20/19 at 4:00 pm. Thank you!  You may close the encounter.

## 2019-09-16 NOTE — Telephone Encounter (Signed)
Spoke with patient and scheduled office visit for 09-16-19 4:00pm.

## 2019-09-16 NOTE — Telephone Encounter (Signed)
I reached out to patient and was unable to reach her just now, so I left a message that I called as she has a video based visit with me scheduled.  Staff has just informed me that she is having technical issues connecting.   I recommend an in office visit for follow up.

## 2019-09-17 ENCOUNTER — Other Ambulatory Visit: Payer: Self-pay

## 2019-09-17 ENCOUNTER — Ambulatory Visit: Payer: 59 | Admitting: Psychology

## 2019-09-20 ENCOUNTER — Telehealth: Payer: Self-pay | Admitting: Cardiology

## 2019-09-20 ENCOUNTER — Encounter: Payer: Self-pay | Admitting: Obstetrics and Gynecology

## 2019-09-20 ENCOUNTER — Ambulatory Visit: Payer: Managed Care, Other (non HMO) | Admitting: Obstetrics and Gynecology

## 2019-09-20 ENCOUNTER — Other Ambulatory Visit: Payer: Self-pay

## 2019-09-20 VITALS — BP 122/70 | HR 76 | Temp 97.3°F | Ht 61.5 in | Wt 136.6 lb

## 2019-09-20 DIAGNOSIS — F439 Reaction to severe stress, unspecified: Secondary | ICD-10-CM | POA: Diagnosis not present

## 2019-09-20 MED ORDER — SERTRALINE HCL 50 MG PO TABS: 50.0000 mg | ORAL_TABLET | Freq: Every day | ORAL | 1 refills | Status: AC

## 2019-09-20 NOTE — Telephone Encounter (Signed)
Returned call to patient no answer.LMTC. 

## 2019-09-20 NOTE — Progress Notes (Signed)
GYNECOLOGY  VISIT   HPI: 64 y.o.   Married  Caucasian  female   G0P0000 with Patient's last menstrual period was 02/21/2013.   here for medication follow up.    Patient seeing PCP for elevated LFTs. Had negative Abd.U/S. She was referred to GI and they are following. She has elevated ferritin.   She has stopped her cholesterol medication on her own.   She is on Zoloft and she stopped Clonazepam.  Feels her mood is improved but still has some anxiety. She was feeling angry at the world, but is not so much now. She feels like she is having better blood pressure and less fluttering of her heart since taking the Zoloft.  States her husband notes she is doing better. She is seeing 2 therapists.   She is fatigued.   She has lost weight from 148 to 131 pounds.   States she may go out on disability. Indicates that her work responsibilities do not allow for any time off.  Her PCP is helping her with this.   GYNECOLOGIC HISTORY: Patient's last menstrual period was 02/21/2013. Contraception: PMP Menopausal hormone therapy:  none Last mammogram: 07-13-19 Bil.Implants/Diag.Bil/Neg/density C/BiRads2/screening 57yr. Last pap smear:  04-07-17 Neg:Neg HR HPV,02-21-14 Neg:Neg HR HPV,10-05-11 Neg        OB History    Gravida  0   Para  0   Term  0   Preterm  0   AB  0   Living  0     SAB  0   TAB  0   Ectopic  0   Multiple  0   Live Births                 Patient Active Problem List   Diagnosis Date Noted  . Incomplete left bundle branch block   . HLD (hyperlipidemia)   . Chest pain 01/03/2015  . Chest wall pain, chronic 04/05/2014  . Shoulder pain, right 12/08/2013  . Atherosclerotic heart disease of native coronary artery with unstable angina pectoris ([place]) 12/08/2013  . Peripheral vascular disease ([place]) 05/16/2011  . Palpitations 05/06/2011  . GERD (gastroesophageal reflux disease) 05/06/2011  . Chronic epigastric pain 04/08/2011  . CAD S/P percutaneous coronary  angioplasty: Ostial LAD Promus DES 3.0 mm x 12 mm 02/13/2011  . Hyperlipidemia with target LDL less than 70 02/13/2011  . BREAST CYST, LEFT 11/23/2009  . 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 ALLERGIC RHINITIS 03/02/2007    Past Medical History:  Diagnosis Date  . ALLERGIC RHINITIS 03/02/2007  . BREAST CYST, LEFT 11/23/2009  . CAD S/P percutaneous coronary angioplasty 01/2011   a) Exertional Back Pain & High RISK ST) - 95% Ostial LAD --> PCI with Promus DES 3.0 mm x 12 mm; b) LHC 08/2012: LAD stent Patent , 50% mid LAD; c) Neg Myoview 03/2012 d). LHC 12/09/2013 40-50% mid-LAD no progression, medical therapy. e) Neg nuc 2016, 2018.  Marland Kitchen HLD (hyperlipidemia)   . Incomplete left bundle branch block   . REACTIVE AIRWAY DISEASE 10/21/2008    Past Surgical History:  Procedure Laterality Date  . AUGMENTATION MAMMAPLASTY Bilateral 1990  . BREAST BIOPSY    . BREAST SURGERY     BILAT IMPLANTS  . LEFT HEART CATHETERIZATION WITH CORONARY ANGIOGRAM N/A 09/10/2012   Procedure: LEFT HEART CATHETERIZATION WITH CORONARY ANGIOGRAM;  Surgeon: Sherren Mocha, MD;  Location: [facility] CATH LAB;  Service: Cardiovascular;  Laterality: N/A;  . LEFT HEART CATHETERIZATION WITH CORONARY ANGIOGRAM N/A 12/09/2013   Procedure:  LEFT HEART CATHETERIZATION WITH CORONARY ANGIOGRAM;  Surgeon: Leonie Man, MD;  Location: [facility] CATH LAB;  Service: Cardiovascular;  Laterality: N/A;  . PERCUTANEOUS CORONARY STENT INTERVENTION (PCI-S)  01/2011   Promus Premier DES 3.0 mm x12 mm Ostial LAD (extends into LM with partial "jailing" of Cx)    Current Outpatient Medications  Medication Sig Dispense Refill  . aspirin EC 81 MG tablet Take 81 mg by mouth every evening.    . sertraline (ZOLOFT) 50 MG tablet Take 1 tablet (50 mg total) by mouth daily. 30 tablet 1  . Coenzyme Q10 (CO Q 10 PO) Take by mouth.    Marland Kitchen ibuprofen (ADVIL,MOTRIN) 200 MG tablet Take 200-400 mg by mouth every 6 (six) hours as needed for pain or headache.    . metoprolol  succinate (TOPROL XL) 25 MG 24 hr tablet Take 1 tablet (25 mg total) by mouth daily. (Patient not taking: Reported on 09/20/2019) 90 tablet 3  . nitroGLYCERIN (NITROSTAT) 0.4 MG SL tablet Place 1 tablet (0.4 mg total) under the tongue every 5 (five) minutes as needed for chest pain. (Patient not taking: Reported on 09/20/2019) 25 tablet 3   No current facility-administered medications for this visit.     ALLERGIES: Prednisone and Crestor [rosuvastatin]  Family History  Problem Relation Age of Onset  . Alcohol abuse Other   . Diabetes Other   . Hypertension Other   . Heart disease Other     Social History   Socioeconomic History  . Marital status: Married    Spouse name: Not on file  . Number of children: Not on file  . Years of education: Not on file  . Highest education level: Not on file  Occupational History    Employer: [employer]    Comment: Health systems manager  Tobacco Use  . Smoking status: Never Smoker  . Smokeless tobacco: Never Used  Substance and Sexual Activity  . Alcohol use: Not Currently    Comment: Occasional beer  . Drug use: No  . Sexual activity: Yes    Partners: Male    Birth control/protection: Post-menopausal  Other Topics Concern  . Not on file  Social History Narrative   Occupation:    Never Smoked   Alcohol use- no   Married   Drug use- no   Regular Exercise- yes    Social Determinants of Radio broadcast assistant Strain:   . Difficulty of Paying Living Expenses:   Food Insecurity:   . Worried About Charity fundraiser in the Last Year:   . Arboriculturist in the Last Year:   Transportation Needs:   . Film/video editor (Medical):   Marland Kitchen Lack of Transportation (Non-Medical):   Physical Activity:   . Days of Exercise per Week:   . Minutes of Exercise per Session:   Stress:   . Feeling of Stress :   Social Connections:   . Frequency of Communication with Friends and Family:   . Frequency of Social Gatherings with  Friends and Family:   . Attends Religious Services:   . Active Member of Clubs or Organizations:   . Attends Archivist Meetings:   Marland Kitchen Marital Status:   Intimate Partner Violence:   . Fear of Current or Ex-Partner:   . Emotionally Abused:   Marland Kitchen Physically Abused:   . Sexually Abused:     Review of Systems  All other systems reviewed and are negative.   PHYSICAL EXAMINATION:  BP 122/70   Pulse 76   Temp (!) 97.3 F (36.3 C) (Temporal)   Ht 5' 1.5" (1.562 m)   Wt 136 lb 9.6 oz (62 kg)   LMP 02/21/2013   BMI 25.39 kg/m     General appearance: alert, cooperative and appears stated age  ASSESSMENT  Situational stress.  Improved on Zoloft.  Self discontinuation of her cholesterol medication.   PLAN  OK to continue Zoloft 50 mg #30, RF one.  I support her seeing her therapists and taking time away from work.  She will reach out to her cardiologist for a recheck regarding her discontinuation of her hyperlipidemic and her current treatment with Zoloft. Fu for annual exam and prn.    An After Visit Summary was printed and given to the patient.  __20____ minutes face to face time of which over 50% was spent in counseling.

## 2019-09-20 NOTE — Telephone Encounter (Signed)
New message   Patient has questions about the medication crestor and her liver enzymes are elevated. Please call to discuss.

## 2019-09-21 NOTE — Telephone Encounter (Signed)
Spoke with pt and approx 6 weeks had bloodwork done and was found to have elevated liver enzymes 10 x higher than the norm and has seen GI . Pt has since stopped the Crestor and numbers have slightly decreased but now has an elevated Ferritin level as a result of  labs that have been drawn by GI Per pt is awaiting a call from GI as these labs were just done Pt does not have a follow up scheduled Will forward to Dr Stanford Breed for review and recommendations

## 2019-09-21 NOTE — Telephone Encounter (Signed)
Follow Up ° °Patient is returning call. Please give patient a call back.  °

## 2019-09-23 NOTE — Telephone Encounter (Signed)
Follow Up  Patient is calling in to follow up again about liver function. Please give patient a call back today to discuss.

## 2019-09-23 NOTE — Telephone Encounter (Signed)
Pt called to report that she has recent elevated liver enzymes.. it has been 10 times the normal range. She stopped her Crestor and Dr. Melford Aase her PCP has been following her labs and they have improved but not back to normal... she asked Dr. Melford Aase about restarting her Crestor and he asked her to call Dr. Stanford Breed.   Her last labs were 09/14/19 and next labs due 10/22/19 according to Dr. Vanetta Mulders office.. they will fax [place] the 09/14/19 labs for Dr. Jacalyn Lefevre review.

## 2019-09-24 NOTE — Telephone Encounter (Signed)
Need LFTs to make decision Angelica [name]

## 2019-10-08 ENCOUNTER — Ambulatory Visit (INDEPENDENT_AMBULATORY_CARE_PROVIDER_SITE_OTHER): Payer: 59 | Admitting: Psychology

## 2019-10-08 DIAGNOSIS — F4323 Adjustment disorder with mixed anxiety and depressed mood: Secondary | ICD-10-CM

## 2019-10-25 ENCOUNTER — Ambulatory Visit (INDEPENDENT_AMBULATORY_CARE_PROVIDER_SITE_OTHER): Payer: 59 | Admitting: Psychology

## 2019-10-25 ENCOUNTER — Telehealth: Payer: Self-pay

## 2019-10-25 DIAGNOSIS — F4323 Adjustment disorder with mixed anxiety and depressed mood: Secondary | ICD-10-CM | POA: Diagnosis not present

## 2019-10-25 NOTE — Telephone Encounter (Signed)
Called and scheduled the pt for a lipid referall at chst office as the nl office is completely booked

## 2019-11-04 ENCOUNTER — Other Ambulatory Visit: Payer: Self-pay

## 2019-11-04 ENCOUNTER — Ambulatory Visit (INDEPENDENT_AMBULATORY_CARE_PROVIDER_SITE_OTHER): Payer: Managed Care, Other (non HMO) | Admitting: Pharmacist

## 2019-11-04 DIAGNOSIS — E785 Hyperlipidemia, unspecified: Secondary | ICD-10-CM

## 2019-11-04 MED ORDER — ROSUVASTATIN CALCIUM 10 MG PO TABS
10.0000 mg | ORAL_TABLET | Freq: Every day | ORAL | 3 refills | Status: DC
Start: 2019-11-04 — End: 2020-06-02

## 2019-11-04 NOTE — Progress Notes (Signed)
Patient ID: Angelica [name]                 DOB: 23-Feb-1956                    MRN: [id number]     HPI: Angelica [name] is a 64 y.o. female patient referred to lipid clinic by Dr. Stanford Breed. PMH is significant for CAD and HLD. Patient has been on Crestor since 2012. Dose was increased to 40mg  daily about a year ago. Started on Zoloft and clonazepam in March 2021. Elevated liver enzymes discovered in April 2021. She has since stopped Crestor and clonazepam. Liver enzymes have since normalized.  Patient presents today to lipid clinic, accompanied by her husband, to discuss medication options. Patient is extremely stressed at work. 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 She has not had a day off in 1.5 years. She is having daily AM diarrhea, but GI workup has been negative. Patient is a little hesitant about needles but states she will do what she has to do. Husband and patient very concerned about her cardiac health.  Current Medications: none Intolerances: rosuvastatin (LFT elevations) Risk Factors: CAD LDL goal: <70  Diet: poor- patient has lost 20lb  Exercise: none  Family History: The patient's family history includes Alcohol abuse in an other family member; Diabetes in an other family member; Heart disease in an other family member; Hypertension in an other family member.  Social History: never smoked, occasional beer  Labs: 10/20/19 TC 229, TG 81, HDL 67, LDL 148 10/20/19: AST 37, ALT 49 09/14/19: AST 96, ALT 33  Past Medical History:  Diagnosis Date  . ALLERGIC RHINITIS 03/02/2007  . BREAST CYST, LEFT 11/23/2009  . CAD S/P percutaneous coronary angioplasty 01/2011   a) Exertional Back Pain & High RISK ST) - 95% Ostial LAD --> PCI with Promus DES 3.0 mm x 12 mm; b) LHC 08/2012: LAD stent Patent , 50% mid LAD; c) Neg Myoview 03/2012 d). LHC 12/09/2013 40-50% mid-LAD no progression, medical therapy. e) Neg nuc  2016, 2018.  Marland Kitchen HLD (hyperlipidemia)   . Incomplete left bundle branch block   . REACTIVE AIRWAY DISEASE 10/21/2008    Current Outpatient Medications on File Prior to Visit  Medication Sig Dispense Refill  . aspirin EC 81 MG tablet Take 81 mg by mouth every evening.    . Coenzyme Q10 (CO Q 10 PO) Take by mouth.    Marland Kitchen ibuprofen (ADVIL,MOTRIN) 200 MG tablet Take 200-400 mg by mouth every 6 (six) hours as needed for pain or headache.    . metoprolol succinate (TOPROL XL) 25 MG 24 hr tablet Take 1 tablet (25 mg total) by mouth daily. (Patient not taking: Reported on 09/20/2019) 90 tablet 3  . nitroGLYCERIN (NITROSTAT) 0.4 MG SL tablet Place 1 tablet (0.4 mg total) under the tongue every 5 (five) minutes as needed for chest pain. (Patient not taking: Reported on 09/20/2019) 25 tablet 3  . sertraline (ZOLOFT) 50 MG tablet Take 1 tablet (50 mg total) by mouth daily. 30 tablet 1   No current facility-administered medications on file prior to visit.    Allergies  Allergen Reactions  . Prednisone Palpitations  . Crestor [Rosuvastatin] Other (See Comments)    Elevated LFTSs    Assessment/Plan:  1. Hyperlipidemia - LDL is above goal of <70 due to being off of cholesterol medication. Discussed using PCSK9i. Patient  and husband would like to retry rosuvastatin at lower dose. I think this is a reasonable option, with close monitoring. Patient did not have any LFT elevation at 20mg  daily. To be cautious, will restart rosuvastatin at 10mg  daily. Recheck LFT in 3 weeks. Patient wishes to ask PCP to order labs so she can have done at his office. We also discussed PCSK9i injection technique and cost. Patient is in agreement to try PCSK9i if LFT increase or if more LDL lowering is needed. Follow up with patient in 3 weeks after LFT drawn.   Thank you,  Ramond Dial, Pharm.D, BCPS, CPP [facility]  [address], [place], [place] [address]  Phone: [phone]; Fax: [phone]

## 2019-11-04 NOTE — Patient Instructions (Addendum)
It was nice to meet you!  We will restart rosuvastatin at 10mg  daily. Plan to monitor LFT very closely. If they increase then we will need to switch to PCSK9i (Repatha or Praluent)  Please have liver enzyme labs done the week of July 19th.  Call me at [phone] with any questions or concerns

## 2019-11-09 ENCOUNTER — Ambulatory Visit (INDEPENDENT_AMBULATORY_CARE_PROVIDER_SITE_OTHER): Payer: 59 | Admitting: Psychology

## 2019-11-09 DIAGNOSIS — F4323 Adjustment disorder with mixed anxiety and depressed mood: Secondary | ICD-10-CM

## 2019-11-19 ENCOUNTER — Ambulatory Visit: Payer: 59 | Admitting: Psychology

## 2019-11-25 ENCOUNTER — Telehealth: Payer: Self-pay | Admitting: Pharmacist

## 2019-11-25 NOTE — Telephone Encounter (Signed)
Called patient to see if she has been able to get LFT checked at PCP office as planned. Patient has not yet gone, but will call PCP today to get lab set up. I will follow up in a few days.

## 2019-12-02 DIAGNOSIS — I2510 Atherosclerotic heart disease of native coronary artery without angina pectoris: Secondary | ICD-10-CM

## 2019-12-02 DIAGNOSIS — E785 Hyperlipidemia, unspecified: Secondary | ICD-10-CM

## 2019-12-27 MED ORDER — NITROGLYCERIN 0.4 MG SL SUBL: 0.4000 mg | SUBLINGUAL_TABLET | SUBLINGUAL | 3 refills | Status: DC | PRN

## 2020-01-05 NOTE — Progress Notes (Signed)
HPI: FU coronary disease. Previously seen for chest pain. Stress echo was markedly abnormal with nonsustained ventricular tachycardia and ischemia in the anteroseptal wall and apex. Patient underwent cardiac catheterization in September of 2012 which revealed a 95% ostial LAD lesion. The patient had PCI with a drug-eluting stent. There was some jailing of the circumflex. Repeat cath at [facility] in Dec 2012 because of palpitations and chest pain revealed patency of LAD stent and she had no other significant CAD. Carotid Dopplers December 2012 showed 0-39% bilateral stenosis. 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 Patient has continued to have chest pain. She had repeat catheterization in May of 2014 which revealed normal left main, patent LAD stent, 50% mid LAD. There was a 30-40% ostial circumflex. Ejection fraction was 55%. Again had repeat cath 8/15 that showed no change (nonobstructive CAD).Nuclear study 7/18 showed EF 61 with no ischemia or infarction. Monitor 8/18 showed sinus with pacs, brief PAT and 6 beats NSVT. Nuclear study August 2020 showed ejection fraction 57% and no ischemia or infarction. Since last seenshe denies dyspnea or syncope.  She continues to have occasional chest pain similar to her previous episodes.  They typically occur during stressful situations.  Last seconds to minutes.  She has lost 40 pounds in the past 4 months and is having difficulties with diarrhea.  She has been seen by gastroenterology.  Her liver functions have been elevated and her Crestor was transiently discontinued.  Current Outpatient Medications  Medication Sig Dispense Refill  . aspirin EC 81 MG tablet Take 81 mg by mouth every evening.    . Coenzyme Q10 (CO Q 10 PO) Take by mouth.    Marland Kitchen ibuprofen (ADVIL,MOTRIN) 200 MG tablet Take 200-400 mg by mouth every 6 (six) hours as needed for pain or headache.    . nitroGLYCERIN (NITROSTAT) 0.4 MG SL tablet  Place 1 tablet (0.4 mg total) under the tongue every 5 (five) minutes as needed for chest pain. 25 tablet 3  . rosuvastatin (CRESTOR) 10 MG tablet Take 1 tablet (10 mg total) by mouth daily. 30 tablet 3  . sertraline (ZOLOFT) 50 MG tablet Take 1 tablet (50 mg total) by mouth daily. 30 tablet 1   No current facility-administered medications for this visit.     Past Medical History:  Diagnosis Date  . ALLERGIC RHINITIS 03/02/2007  . BREAST CYST, LEFT 11/23/2009  . CAD S/P percutaneous coronary angioplasty 01/2011   a) Exertional Back Pain & High RISK ST) - 95% Ostial LAD --> PCI with Promus DES 3.0 mm x 12 mm; b) LHC 08/2012: LAD stent Patent , 50% mid LAD; c) Neg Myoview 03/2012 d). LHC 12/09/2013 40-50% mid-LAD no progression, medical therapy. e) Neg nuc 2016, 2018.  Marland Kitchen HLD (hyperlipidemia)   . Incomplete left bundle branch block   . REACTIVE AIRWAY DISEASE 10/21/2008    Past Surgical History:  Procedure Laterality Date  . AUGMENTATION MAMMAPLASTY Bilateral 1990  . BREAST BIOPSY    . BREAST SURGERY     BILAT IMPLANTS  . LEFT HEART CATHETERIZATION WITH CORONARY ANGIOGRAM N/A 09/10/2012   Procedure: LEFT HEART CATHETERIZATION WITH CORONARY ANGIOGRAM;  Surgeon: Sherren Mocha, MD;  Location: [facility] CATH LAB;  Service: Cardiovascular;  Laterality: N/A;  . LEFT HEART CATHETERIZATION WITH CORONARY ANGIOGRAM N/A 12/09/2013   Procedure: LEFT HEART CATHETERIZATION WITH CORONARY ANGIOGRAM;  Surgeon: Leonie Man, MD;  Location: [facility] CATH LAB;  Service: Cardiovascular;  Laterality:  N/A;  . PERCUTANEOUS CORONARY STENT INTERVENTION (PCI-S)  01/2011   Promus Premier DES 3.0 mm x12 mm Ostial LAD (extends into LM with partial "jailing" of Cx)    Social History   Socioeconomic History  . Marital status: Married    Spouse name: Not on file  . Number of children: Not on file  . Years of education: Not on file  . Highest education level: Not on file  Occupational History    Employer: [employer]     Comment: Health systems manager  Tobacco Use  . Smoking status: Never Smoker  . Smokeless tobacco: Never Used  Vaping Use  . Vaping Use: Never used  Substance and Sexual Activity  . Alcohol use: Not Currently    Comment: Occasional beer  . Drug use: No  . Sexual activity: Yes    Partners: Male    Birth control/protection: Post-menopausal  Other Topics Concern  . Not on file  Social History Narrative   Occupation:    Never Smoked   Alcohol use- no   Married   Drug use- no   Regular Exercise- yes    Social Determinants of Radio broadcast assistant Strain:   . Difficulty of Paying Living Expenses: Not on file  Food Insecurity:   . Worried About Charity fundraiser in the Last Year: Not on file  . Ran Out of Food in the Last Year: Not on file  Transportation Needs:   . Lack of Transportation (Medical): Not on file  . Lack of Transportation (Non-Medical): Not on file  Physical Activity:   . Days of Exercise per Week: Not on file  . Minutes of Exercise per Session: Not on file  Stress:   . Feeling of Stress : Not on file  Social Connections:   . Frequency of Communication with Friends and Family: Not on file  . Frequency of Social Gatherings with Friends and Family: Not on file  . Attends Religious Services: Not on file  . Active Member of Clubs or Organizations: Not on file  . Attends Archivist Meetings: Not on file  . Marital Status: Not on file  Intimate Partner Violence:   . Fear of Current or Ex-Partner: Not on file  . Emotionally Abused: Not on file  . Physically Abused: Not on file  . Sexually Abused: Not on file    Family History  Problem Relation Age of Onset  . Alcohol abuse Other   . Diabetes Other   . Hypertension Other   . Heart disease Other     ROS: Weight loss and diarrhea but no fevers or chills, productive cough, hemoptysis, dysphasia, odynophagia, melena, hematochezia, dysuria, hematuria, rash, seizure activity, orthopnea, PND,  pedal edema, claudication. Remaining systems are negative.  Physical Exam: Well-developed well-nourished in no acute distress.  Skin is warm and dry.  HEENT is normal.  Neck is supple.  Chest is clear to auscultation with normal expansion.  Cardiovascular exam is regular rate and rhythm.  Abdominal exam nontender or distended. No masses palpated. Extremities show no edema. neuro grossly intact  ECG-sinus rhythm at a rate of 67, normal axis, cannot rule out prior septal infarct, no ST changes. Personally reviewed  A/P  1 coronary artery disease-plan to continue medical therapy with aspirin and statin.  2 chest pain-electrocardiogram shows no new ST changes.  Previous nuclear study showed no ischemia.  We will follow for now.  3 hyperlipidemia-patient's liver functions have been elevated recently.  They  improved with discontinuing her statin.  She is now back on 5 mg daily.  She is scheduled to have lipids and liver next week.  I will have results forwarded to me.  If liver functions are again elevated I would consider discontinuing statins and instead treating with either Zetia or Repatha or Praluent.  4 hypertension-patient's blood pressure is controlled on no medications.  5 palpitations-beta-blocker discontinued previously due to lethargy and fatigue.  6 diarrhea/weight loss-I will ask her to follow-up with primary care and gastroenterology for this issue.  Kirk Ruths, MD

## 2020-01-06 ENCOUNTER — Ambulatory Visit: Payer: Managed Care, Other (non HMO) | Admitting: Cardiology

## 2020-01-06 ENCOUNTER — Other Ambulatory Visit: Payer: Self-pay

## 2020-01-06 ENCOUNTER — Encounter: Payer: Self-pay | Admitting: Cardiology

## 2020-01-06 VITALS — BP 120/74 | HR 67 | Ht 61.0 in | Wt 113.8 lb

## 2020-01-06 DIAGNOSIS — E785 Hyperlipidemia, unspecified: Secondary | ICD-10-CM

## 2020-01-06 DIAGNOSIS — I2510 Atherosclerotic heart disease of native coronary artery without angina pectoris: Secondary | ICD-10-CM | POA: Diagnosis not present

## 2020-01-06 DIAGNOSIS — R072 Precordial pain: Secondary | ICD-10-CM

## 2020-01-06 DIAGNOSIS — I2583 Coronary atherosclerosis due to lipid rich plaque: Secondary | ICD-10-CM

## 2020-01-06 NOTE — Patient Instructions (Signed)
Medication Instructions:  NO CHANGE *If you need a refill on your cardiac medications before your next appointment, please call your pharmacy*   Follow-Up: At [facility], you and your health needs are our priority.  As part of our continuing mission to provide you with exceptional heart care, we have created designated Provider Care Teams.  These Care Teams include your primary Cardiologist (physician) and Advanced Practice Providers (APPs -  Physician Assistants and Nurse Practitioners) who all work together to provide you with the care you need, when you need it.  We recommend signing up for the patient portal called "MyChart".  Sign up information is provided on this After Visit Summary.  MyChart is used to connect with patients for Virtual Visits (Telemedicine).  Patients are able to view lab/test results, encounter notes, upcoming appointments, etc.  Non-urgent messages can be sent to your provider as well.   To learn more about what you can do with MyChart, go to [email or web address].    Your next appointment:   6 month(s)  The format for your next appointment:   In Person  Provider:   You may see Kirk Ruths, MD or one of the following Advanced Practice Providers on your designated Care Team:    Kerin Ransom, PA-C  [place], [place]  Coletta Memos, [place]

## 2020-01-11 ENCOUNTER — Other Ambulatory Visit: Payer: Self-pay | Admitting: Obstetrics and Gynecology

## 2020-01-25 NOTE — Telephone Encounter (Signed)
Add Crestor 5 mg daily and Zetia 10 mg daily. Check lipids and liver in 12 weeks.  Kirk Ruths

## 2020-01-25 NOTE — Telephone Encounter (Signed)
Labs can be seen in care everywhere TC 225 TG 76 HDL 92 LDL 120  ALT 34 AST 35  I am unsure if these are on or off rosuvastatin. She was supposed to be on rosuvastatin 10mg  daily. Im not sure if she was taking 10mg , 5mg  (as your last note said), or nothing. She has not responded to my mychart message yet.  I do think adding zetia would be a good idea. Patient concerned about cost with [id number].

## 2020-01-28 NOTE — Telephone Encounter (Signed)
Unable to reach pt or leave a message mailbox is full 

## 2020-02-10 ENCOUNTER — Telehealth: Payer: Self-pay | Admitting: Cardiology

## 2020-02-10 DIAGNOSIS — E785 Hyperlipidemia, unspecified: Secondary | ICD-10-CM

## 2020-02-10 MED ORDER — EZETIMIBE 10 MG PO TABS
10.0000 mg | ORAL_TABLET | Freq: Every day | ORAL | 3 refills | Status: DC
Start: 2020-02-10 — End: 2021-03-07

## 2020-02-10 NOTE — Telephone Encounter (Signed)
Patient called stating the based on her last blood results Dr. Stanford Breed was supposed to get back to her about prescribing Zetia to her. She wanted to know what the status was on that.

## 2020-02-10 NOTE — Telephone Encounter (Signed)
Spoke with patient about MD recommendations that were sent via MyChart message reply on 01/25/20 - patient has not reviewed She voiced understanding of change - add crestor 5mg  and zetia 10mg  daily She has crestor Rx at home, [facility]ia sent to [facility] Repeat labs ordered/mailed - patient aware they are due in Jan (fasting)

## 2020-05-06 DIAGNOSIS — K52832 Lymphocytic colitis: Secondary | ICD-10-CM | POA: Insufficient documentation

## 2020-05-08 ENCOUNTER — Encounter: Payer: Self-pay | Admitting: Obstetrics and Gynecology

## 2020-06-02 ENCOUNTER — Other Ambulatory Visit: Payer: Self-pay

## 2020-06-02 ENCOUNTER — Ambulatory Visit (INDEPENDENT_AMBULATORY_CARE_PROVIDER_SITE_OTHER): Payer: Managed Care, Other (non HMO) | Admitting: Obstetrics and Gynecology

## 2020-06-02 ENCOUNTER — Encounter: Payer: Self-pay | Admitting: Obstetrics and Gynecology

## 2020-06-02 ENCOUNTER — Other Ambulatory Visit (HOSPITAL_COMMUNITY)
Admission: RE | Admit: 2020-06-02 | Discharge: 2020-06-02 | Disposition: A | Discharge status: Home / Self Care | Payer: Managed Care, Other (non HMO) | Source: Ambulatory Visit | Attending: Obstetrics and Gynecology | Admitting: Obstetrics and Gynecology

## 2020-06-02 VITALS — BP 138/90 | HR 78 | Resp 14 | Ht 62.0 in | Wt 100.5 lb

## 2020-06-02 DIAGNOSIS — Z01419 Encounter for gynecological examination (general) (routine) without abnormal findings: Secondary | ICD-10-CM | POA: Diagnosis present

## 2020-06-02 DIAGNOSIS — Z23 Encounter for immunization: Secondary | ICD-10-CM | POA: Diagnosis not present

## 2020-06-02 DIAGNOSIS — K909 Intestinal malabsorption, unspecified: Secondary | ICD-10-CM | POA: Diagnosis not present

## 2020-06-02 DIAGNOSIS — Z780 Asymptomatic menopausal state: Secondary | ICD-10-CM | POA: Diagnosis not present

## 2020-06-02 NOTE — Patient Instructions (Signed)

## 2020-06-02 NOTE — Progress Notes (Signed)
65 y.o. G65P0000 Married Caucasian female here for annual exam.    Lost 50 pounds  Having chronic diarrhea.   On Zoloft 100 mg and Clonazepam now through PCP.   Still has chronic right breast pain.  Had a normal dx mammogram, March 2021.  On disability.   Wants a flu vaccine.   PCP:   Anastasia Pall, MD   Patient's last menstrual period was 02/21/2013.           Sexually active: Yes.    The current method of family planning is post menopausal status.    Exercising: No.  The patient does not participate in regular exercise at present. Smoker:  no  Health Maintenance: Pap:  04-07-17 negative, HR HPV negative            02-21-14 negative, HR HPV negative  History of abnormal Pap:  no MMG:  07-13-19 density C/BIRADS 2 benign  Colonoscopy:  Scheduled 06-14-20 for chronic diarrhea  BMD:   2010  Result  Normal per patient  TDaP:  05-06-10 Gardasil:   no HIV: unsure  Hep C: 2016 negative  Screening Labs:  Hb today: PCP, Urine today: not collected    reports that she has never smoked. She has never used smokeless tobacco. She reports previous alcohol use. She reports that she does not use drugs.  Past Medical History:  Diagnosis Date  . ALLERGIC RHINITIS 03/02/2007  . BREAST CYST, LEFT 11/23/2009  . CAD S/P percutaneous coronary angioplasty 01/2011   a) Exertional Back Pain & High RISK ST) - 95% Ostial LAD --> PCI with Promus DES 3.0 mm x 12 mm; b) LHC 08/2012: LAD stent Patent , 50% mid LAD; c) Neg Myoview 03/2012 d). LHC 12/09/2013 40-50% mid-LAD no progression, medical therapy. e) Neg nuc 2016, 2018.  Marland Kitchen HLD (hyperlipidemia)   . Incomplete left bundle branch block   . REACTIVE AIRWAY DISEASE 10/21/2008    Past Surgical History:  Procedure Laterality Date  . AUGMENTATION MAMMAPLASTY Bilateral 1990  . BREAST BIOPSY    . BREAST SURGERY     BILAT IMPLANTS  . LEFT HEART CATHETERIZATION WITH CORONARY ANGIOGRAM N/A 09/10/2012   Procedure: LEFT HEART CATHETERIZATION WITH CORONARY  ANGIOGRAM;  Surgeon: Sherren Mocha, MD;  Location: [facility] CATH LAB;  Service: Cardiovascular;  Laterality: N/A;  . LEFT HEART CATHETERIZATION WITH CORONARY ANGIOGRAM N/A 12/09/2013   Procedure: LEFT HEART CATHETERIZATION WITH CORONARY ANGIOGRAM;  Surgeon: Leonie Man, MD;  Location: [facility] CATH LAB;  Service: Cardiovascular;  Laterality: N/A;  . PERCUTANEOUS CORONARY STENT INTERVENTION (PCI-S)  01/2011   Promus Premier DES 3.0 mm x12 mm Ostial LAD (extends into LM with partial "jailing" of Cx)    Current Outpatient Medications  Medication Sig Dispense Refill  . aspirin EC 81 MG tablet Take 81 mg by mouth every evening.    . clonazePAM (KLONOPIN) 0.5 MG tablet Take by mouth.    . Coenzyme Q10 (CO Q 10 PO) Take by mouth.    . dicyclomine (BENTYL) 20 MG tablet Take 20 mg by mouth 3 (three) times daily.    . ergocalciferol (VITAMIN D2) 1.25 MG (50000 UT) capsule TAKE 1 CAPSULE ONCE WEEKLY    . ibuprofen (ADVIL,MOTRIN) 200 MG tablet Take 200-400 mg by mouth every 6 (six) hours as needed for pain or headache.    . nitroGLYCERIN (NITROSTAT) 0.4 MG SL tablet Place 1 tablet (0.4 mg total) under the tongue every 5 (five) minutes as needed for chest pain. 25 tablet 3  .  sertraline (ZOLOFT) 50 MG tablet Take 1 tablet (50 mg total) by mouth daily. 30 tablet 1  . diphenoxylate-atropine (LOMOTIL) 2.5-0.025 MG tablet Take by mouth. (Patient not taking: Reported on 06/02/2020)    . ezetimibe (ZETIA) 10 MG tablet Take 1 tablet (10 mg total) by mouth daily. 90 tablet 3  . pravastatin (PRAVACHOL) 40 MG tablet Take by mouth. (Patient not taking: Reported on 06/02/2020)     No current facility-administered medications for this visit.    Family History  Problem Relation Age of Onset  . Alcohol abuse Other   . Diabetes Other   . Hypertension Other   . Heart disease Other     Review of Systems  Gastrointestinal: Positive for diarrhea.  Genitourinary:       Pain in right breast     Exam:   BP 138/90 (BP  Location: Left Arm, Patient Position: Sitting, Cuff Size: Normal)   Pulse 78   Resp 14   Ht 5\' 2"  (1.575 m)   Wt 100 lb 8 oz (45.6 kg)   LMP 02/21/2013   BMI 18.38 kg/m     General appearance: alert, cooperative and appears stated age Head: normocephalic, without obvious abnormality, atraumatic Neck: no adenopathy, supple, symmetrical, trachea midline and thyroid normal to inspection and palpation Lungs: clear to auscultation bilaterally Breasts: bilateral implants, no masses or tenderness, No nipple retraction or dimpling, No nipple discharge or bleeding, No axillary adenopathy Heart: regular rate and rhythm Abdomen: soft, non-tender; no masses, no organomegaly Extremities: extremities normal, atraumatic, no cyanosis or edema Skin: skin color, texture, turgor normal. No rashes or lesions Lymph nodes: cervical, supraclavicular, and axillary nodes normal. Neurologic: grossly normal  Pelvic: External genitalia:  no lesions              No abnormal inguinal nodes palpated.              Urethra:  normal appearing urethra with no masses, tenderness or lesions              Bartholins and Skenes: normal                 Vagina: normal appearing vagina with normal color and discharge, no lesions              Cervix: no lesions              Pap taken: Yes.   Bimanual Exam:  Uterus:  normal size, contour, position, consistency, mobility, non-tender              Adnexa: no mass, fullness, tenderness              Rectal exam: Declined.   Chaperone was present for exam.  Assessment:   Well woman visit with normal exam. Bilateral breast implants. Chronic right breast pain.  Anxiety and stress.  Malabsorption syndrome.  Hx CAD. LBBB.  Plan: Mammogram screening discussed. Self breast awareness reviewed. Pap and HR HPV as above. Guidelines for Calcium, Vitamin D, regular exercise program including cardiovascular and weight bearing exercise. BMD Flu vaccine. Follow up annually and prn.

## 2020-06-05 LAB — CYTOLOGY - PAP
Comment: NEGATIVE
Diagnosis: NEGATIVE
High risk HPV: NEGATIVE

## 2020-06-06 ENCOUNTER — Other Ambulatory Visit: Payer: Self-pay | Admitting: Obstetrics and Gynecology

## 2020-06-06 DIAGNOSIS — K529 Noninfective gastroenteritis and colitis, unspecified: Secondary | ICD-10-CM

## 2020-06-06 DIAGNOSIS — Z1231 Encounter for screening mammogram for malignant neoplasm of breast: Secondary | ICD-10-CM

## 2020-06-06 HISTORY — DX: Noninfective gastroenteritis and colitis, unspecified: K52.9

## 2020-06-30 NOTE — Progress Notes (Signed)
HPI: FU coronary disease. Previously seen for chest pain. Stress echo was markedly abnormal with nonsustained ventricular tachycardia and ischemia in the anteroseptal wall and apex. Patient underwent cardiac catheterization in September of 2012 which revealed a 95% ostial LAD lesion. The patient had PCI with a drug-eluting stent. There was some jailing of the circumflex. Repeat cath at [facility] in Dec 2012 because of palpitations and chest pain revealed patency of LAD stent and she had no other significant CAD. Carotid Dopplers December 2012 showed 0-39% bilateral stenosis. 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 Patient has continued to have chest pain. She had repeat catheterization in May of 2014 which revealed normal left main, patent LAD stent, 50% mid LAD. There was a 30-40% ostial circumflex. Ejection fraction was 55%. Again had repeat cath 8/15 that showed no change (nonobstructive CAD).Nuclear study 7/18 showed EF 61 with no ischemia or infarction. Monitor 8/18 showed sinus with pacs, brief PAT and 6 beats NSVT. Nuclear study August 2020 showed ejection fraction 57% and no ischemia or infarction.Since last seenshe denies dyspnea or exertional chest pain.  No syncope.  She occasionally feels an uncomfortable sensation in her chest not related to activities.  Similar to previous.  Current Outpatient Medications  Medication Sig Dispense Refill  . pravastatin (PRAVACHOL) 40 MG tablet Take by mouth.    Marland Kitchen aspirin EC 81 MG tablet Take 81 mg by mouth every evening.    . clonazePAM (KLONOPIN) 0.5 MG tablet Take by mouth.    . Coenzyme Q10 (CO Q 10 PO) Take by mouth.    . dicyclomine (BENTYL) 20 MG tablet Take 20 mg by mouth 3 (three) times daily.    . ergocalciferol (VITAMIN D2) 1.25 MG (50000 UT) capsule TAKE 1 CAPSULE ONCE WEEKLY    . ezetimibe (ZETIA) 10 MG tablet Take 1 tablet (10 mg total) by mouth daily. 90 tablet 3  . ibuprofen  (ADVIL,MOTRIN) 200 MG tablet Take 200-400 mg by mouth every 6 (six) hours as needed for pain or headache.    . nitroGLYCERIN (NITROSTAT) 0.4 MG SL tablet Place 1 tablet (0.4 mg total) under the tongue every 5 (five) minutes as needed for chest pain. 25 tablet 3  . sertraline (ZOLOFT) 50 MG tablet Take 1 tablet (50 mg total) by mouth daily. 30 tablet 1   No current facility-administered medications for this visit.     Past Medical History:  Diagnosis Date  . ALLERGIC RHINITIS 03/02/2007  . BREAST CYST, LEFT 11/23/2009  . CAD S/P percutaneous coronary angioplasty 01/2011   a) Exertional Back Pain & High RISK ST) - 95% Ostial LAD --> PCI with Promus DES 3.0 mm x 12 mm; b) LHC 08/2012: LAD stent Patent , 50% mid LAD; c) Neg Myoview 03/2012 d). LHC 12/09/2013 40-50% mid-LAD no progression, medical therapy. e) Neg nuc 2016, 2018.  Marland Kitchen HLD (hyperlipidemia)   . Incomplete left bundle branch block   . REACTIVE AIRWAY DISEASE 10/21/2008    Past Surgical History:  Procedure Laterality Date  . AUGMENTATION MAMMAPLASTY Bilateral 1990  . BREAST BIOPSY    . BREAST SURGERY     BILAT IMPLANTS  . LEFT HEART CATHETERIZATION WITH CORONARY ANGIOGRAM N/A 09/10/2012   Procedure: LEFT HEART CATHETERIZATION WITH CORONARY ANGIOGRAM;  Surgeon: Sherren Mocha, MD;  Location: [facility] CATH LAB;  Service: Cardiovascular;  Laterality: N/A;  . LEFT HEART CATHETERIZATION WITH CORONARY ANGIOGRAM N/A 12/09/2013   Procedure: LEFT HEART CATHETERIZATION  WITH CORONARY ANGIOGRAM;  Surgeon: Leonie Man, MD;  Location: [facility] CATH LAB;  Service: Cardiovascular;  Laterality: N/A;  . PERCUTANEOUS CORONARY STENT INTERVENTION (PCI-S)  01/2011   Promus Premier DES 3.0 mm x12 mm Ostial LAD (extends into LM with partial "jailing" of Cx)    Social History   Socioeconomic History  . Marital status: Married    Spouse name: Not on file  . Number of children: Not on file  . Years of education: Not on file  . Highest education level: Not on file   Occupational History    Employer: [employer]    Comment: Health systems manager  Tobacco Use  . Smoking status: Never Smoker  . Smokeless tobacco: Never Used  Vaping Use  . Vaping Use: Never used  Substance and Sexual Activity  . Alcohol use: Not Currently    Comment: Occasional beer  . Drug use: No  . Sexual activity: Yes    Partners: Male    Birth control/protection: Post-menopausal  Other Topics Concern  . Not on file  Social History Narrative   Occupation:    Never Smoked   Alcohol use- no   Married   Drug use- no   Regular Exercise- yes    Social Determinants of Radio broadcast assistant Strain: Not on file  Food Insecurity: Not on file  Transportation Needs: Not on file  Physical Activity: Not on file  Stress: Not on file  Social Connections: Not on file  Intimate Partner Violence: Not on file    Family History  Problem Relation Age of Onset  . Alcohol abuse Other   . Diabetes Other   . Hypertension Other   . Heart disease Other     ROS: Diarrhea and weight loss but no fevers or chills, productive cough, hemoptysis, dysphasia, odynophagia, melena, hematochezia, dysuria, hematuria, rash, seizure activity, orthopnea, PND, pedal edema, claudication. Remaining systems are negative.  Physical Exam: Well-developed well-nourished in no acute distress.  Skin is warm and dry.  HEENT is normal.  Neck is supple.  Chest is clear to auscultation with normal expansion.  Cardiovascular exam is regular rate and rhythm.  Abdominal exam nontender or distended. No masses palpated. Extremities show no edema. neuro grossly intact  ECG-normal sinus rhythm at a rate of 68, no ST changes.  Personally reviewed  A/P  1 coronary artery disease-continue aspirin and statin.  2 chest pain-occasional mild discomfort in her chest but no exertional chest pain.  Symptoms are similar to previous.  Electrocardiogram shows no ST changes.  We will continue to follow.  3  hyperlipidemia-she had significant elevation in liver functions with Crestor 40 mg daily.  She is now on Zetia and pravastatin 10 mg daily and is tolerating this.  Apparently her liver functions are also stable.  We will advance with goal LDL less than 70.  4 hypertension-patient's blood pressure is now somewhat low on no medications.  This is likely secondary to recent diarrhea and weight loss.  Continue to follow.  5 palpitations-symptoms are reasonable at present.  Beta-blocker discontinued previously due to lethargy and fatigue.  Kirk Ruths, MD

## 2020-07-10 ENCOUNTER — Other Ambulatory Visit: Payer: Self-pay

## 2020-07-10 ENCOUNTER — Encounter: Payer: Self-pay | Admitting: Cardiology

## 2020-07-10 ENCOUNTER — Ambulatory Visit: Payer: Managed Care, Other (non HMO) | Admitting: Cardiology

## 2020-07-10 VITALS — BP 98/72 | HR 68 | Ht 62.0 in | Wt 97.4 lb

## 2020-07-10 DIAGNOSIS — I2583 Coronary atherosclerosis due to lipid rich plaque: Secondary | ICD-10-CM | POA: Diagnosis not present

## 2020-07-10 DIAGNOSIS — I10 Essential (primary) hypertension: Secondary | ICD-10-CM | POA: Diagnosis not present

## 2020-07-10 DIAGNOSIS — I2510 Atherosclerotic heart disease of native coronary artery without angina pectoris: Secondary | ICD-10-CM

## 2020-07-10 DIAGNOSIS — R072 Precordial pain: Secondary | ICD-10-CM

## 2020-07-10 DIAGNOSIS — E785 Hyperlipidemia, unspecified: Secondary | ICD-10-CM | POA: Diagnosis not present

## 2020-07-10 NOTE — Patient Instructions (Signed)

## 2020-07-20 ENCOUNTER — Inpatient Hospital Stay: Admission: RE | Admit: 2020-07-20 | Payer: Managed Care, Other (non HMO) | Source: Ambulatory Visit

## 2020-07-25 DIAGNOSIS — F419 Anxiety disorder, unspecified: Secondary | ICD-10-CM | POA: Insufficient documentation

## 2020-08-11 ENCOUNTER — Other Ambulatory Visit: Payer: Self-pay

## 2020-08-11 ENCOUNTER — Ambulatory Visit
Admission: RE | Admit: 2020-08-11 | Discharge: 2020-08-11 | Disposition: A | Discharge status: Home / Self Care | Payer: Managed Care, Other (non HMO) | Source: Ambulatory Visit | Attending: Obstetrics and Gynecology | Admitting: Obstetrics and Gynecology

## 2020-08-11 DIAGNOSIS — Z1231 Encounter for screening mammogram for malignant neoplasm of breast: Secondary | ICD-10-CM

## 2020-10-24 ENCOUNTER — Ambulatory Visit
Admission: RE | Admit: 2020-10-24 | Discharge: 2020-10-24 | Disposition: A | Discharge status: Home / Self Care | Payer: Managed Care, Other (non HMO) | Source: Ambulatory Visit | Attending: Obstetrics and Gynecology | Admitting: Obstetrics and Gynecology

## 2020-10-24 ENCOUNTER — Other Ambulatory Visit: Payer: Self-pay

## 2020-10-24 DIAGNOSIS — K909 Intestinal malabsorption, unspecified: Secondary | ICD-10-CM

## 2021-01-05 ENCOUNTER — Telehealth: Payer: Self-pay | Admitting: Cardiology

## 2021-01-05 NOTE — Telephone Encounter (Signed)
Attempted to reach patient at # provided No answer  Called mobile # listed and left message with man answering to have patient return call

## 2021-01-05 NOTE — Telephone Encounter (Signed)
Pt sent this Via MYchart to the sching pool:     Angelica [name] This e-mail went to my junk mail - The CP comes and goes intermittently( sharp pain in middle of chest) - some hot flashes but could just be heat -more so last two weeks - coming and going  and chest feels "heavy" - No take aspirin as nitro gives really sick headache I am just concerned as this is a change from usual I appreciate the help [name]   ----- Message -----      From:[name]      Sent:01/04/2021  2:58 PM EDT        [name]   Subject:Appointment Request  Good Afternoon [name],  Can you tell me a little more about your chest pain?    1. Are you having CP right now?   2. Are you experiencing any other symptoms (ex. SOB, nausea, vomiting, sweating)?   3. How long have you been experiencing CP?   4. Is your CP continuous or coming and going?   5. Have you taken Nitroglycerin?

## 2021-01-05 NOTE — Telephone Encounter (Signed)
Communicated w/patient via MyChart message She has been scheduled for acute visit with DOD on 01/09/21 She is aware to call on-call provider over the long weekend or seek ED eval for worsening or new/acute symptoms

## 2021-01-08 NOTE — Progress Notes (Signed)
Cardiology Office Note:   Date:  01/09/2021  NAME:  Angelica [name]    MRN: [id number] DOB:  15-May-1955   PCP:  Chesley Noon, MD  Cardiologist:  Kirk Ruths, MD  Electrophysiologist:  None   Referring MD: Chesley Noon, MD   Chief Complaint  Patient presents with   Chest Pain   History of Present Illness:   Angelica [name] is a 65 y.o. female with a hx of Cad s/p PCI who presents for follow-up. History of CAD s/p PCI to pLAD in 2012. Has had numerous left heart cath procedures since that time that show non-obstructive CAD. NM Stress 2020 was normal. She reports for the last 2 to 3 weeks has had sharp stabbing central chest pain.  She reports no identifiable trigger.  The pain last seconds.  She reports it can happen multiple times per day.  No recent change in medications.  The only change in medical history is she has been diagnosed with microscopic colitis.  Her EKG demonstrates normal sinus rhythm with an old anterior infarct.  No acute ischemic changes are noted.  She is had multiple heart caths since her procedure in 2012 that were normal.  Her most recent stress test in 2020 was normal.  She reports today symptoms are better.  She reports her diagnosis of colitis is stressful.  She reports she is lost nearly 60 pounds.  She is working with gastroenterology to treat this.  Symptoms do not appear to be associated with exertion.  Alleviated by rest.  Her symptoms appear to have been back pain when she had her myocardial infarction in 2012.  Cardiovascular examination is normal today.  She reports that symptoms lasted very infrequently.  Not alleviated by nitroglycerin.  Problem List CAD -pLAD PCI 01/2011 -LHC 2015 -> non-obstructive  -NM 12/28/2018 -> normal  Past Medical History: Past Medical History:  Diagnosis Date   ALLERGIC RHINITIS 03/02/2007   BREAST CYST, LEFT 11/23/2009   CAD S/P percutaneous coronary angioplasty 01/2011   a) Exertional Back Pain & High RISK  ST) - 95% Ostial LAD --> PCI with Promus DES 3.0 mm x 12 mm; b) LHC 08/2012: LAD stent Patent , 50% mid LAD; c) Neg Myoview 03/2012 d). LHC 12/09/2013 40-50% mid-LAD no progression, medical therapy. e) Neg nuc 2016, 2018.   HLD (hyperlipidemia)    Incomplete left bundle branch block    REACTIVE AIRWAY DISEASE 10/21/2008    Past Surgical History: Past Surgical History:  Procedure Laterality Date   AUGMENTATION MAMMAPLASTY Bilateral 1990   BREAST BIOPSY     BREAST SURGERY     BILAT IMPLANTS   LEFT HEART CATHETERIZATION WITH CORONARY ANGIOGRAM N/A 09/10/2012   Procedure: LEFT HEART CATHETERIZATION WITH CORONARY ANGIOGRAM;  Surgeon: Sherren Mocha, MD;  Location: [facility] CATH LAB;  Service: Cardiovascular;  Laterality: N/A;   LEFT HEART CATHETERIZATION WITH CORONARY ANGIOGRAM N/A 12/09/2013   Procedure: LEFT HEART CATHETERIZATION WITH CORONARY ANGIOGRAM;  Surgeon: Leonie Man, MD;  Location: [facility] CATH LAB;  Service: Cardiovascular;  Laterality: N/A;   PERCUTANEOUS CORONARY STENT INTERVENTION (PCI-S)  01/2011   Promus Premier DES 3.0 mm x12 mm Ostial LAD (extends into LM with partial "jailing" of Cx)    Current Medications: Current Meds  Medication Sig   aspirin EC 81 MG tablet Take 81 mg by mouth every evening.   clonazePAM (KLONOPIN) 0.5 MG tablet Take by mouth.   Coenzyme Q10 (CO Q 10 PO) Take by mouth.   dicyclomine (  BENTYL) 20 MG tablet Take 20 mg by mouth 3 (three) times daily.   ergocalciferol (VITAMIN D2) 1.25 MG (50000 UT) capsule TAKE 1 CAPSULE ONCE WEEKLY   ibuprofen (ADVIL,MOTRIN) 200 MG tablet Take 200-400 mg by mouth every 6 (six) hours as needed for pain or headache.   pravastatin (PRAVACHOL) 40 MG tablet Take by mouth.   sertraline (ZOLOFT) 50 MG tablet Take 1 tablet (50 mg total) by mouth daily.   [DISCONTINUED] nitroGLYCERIN (NITROSTAT) 0.4 MG SL tablet Place 1 tablet (0.4 mg total) under the tongue every 5 (five) minutes as needed for chest pain.     Allergies:    Prednisone and  Crestor [rosuvastatin]   Social History: Social History   Socioeconomic History   Marital status: Married    Spouse name: Not on file   Number of children: Not on file   Years of education: Not on file   Highest education level: Not on file  Occupational History    Employer: [employer]    Comment: Education officer, environmental  Tobacco Use   Smoking status: Never   Smokeless tobacco: Never  Vaping Use   Vaping Use: Never used  Substance and Sexual Activity   Alcohol use: Not Currently    Comment: Occasional beer   Drug use: No   Sexual activity: Yes    Partners: Male    Birth control/protection: Post-menopausal  Other Topics Concern   Not on file  Social History Narrative   Occupation:    Never Smoked   Alcohol use- no   Married   Drug use- no   Regular Exercise- yes    Social Determinants of Radio broadcast assistant Strain: Not on file  Food Insecurity: Not on file  Transportation Needs: Not on file  Physical Activity: Not on file  Stress: Not on file  Social Connections: Not on file    Family History: The patient's family history includes Alcohol abuse in an other family member; Diabetes in an other family member; Heart disease in an other family member; Hypertension in an other family member.  ROS:   All other ROS reviewed and negative. Pertinent positives noted in the HPI.     EKGs/Labs/Other Studies Reviewed:   The following studies were personally reviewed by me today:  EKG:  EKG is ordered today.  The ekg ordered today demonstrates normal sinus rhythm heart rate 69, no acute ischemic changes, old anterior infarct noted, and was personally reviewed by me.   Recent Labs: No results found for requested labs within last 8760 hours.   Recent Lipid Panel    Component Value Date/Time   CHOL 145 03/30/2014 0818   TRIG 48.0 03/30/2014 0818   TRIG 67 03/13/2006 1112   HDL 80.10 03/30/2014 0818   CHOLHDL 2 03/30/2014 0818   VLDL 9.6 03/30/2014 0818    LDLCALC 55 03/30/2014 0818   LDLDIRECT 101.2 11/15/2008 0943    Physical Exam:   VS:  BP 106/68   Pulse 69   Ht '5\' 2"'$  (1.575 m)   Wt 95 lb (43.1 kg)   LMP 02/21/2013   SpO2 98%   BMI 17.38 kg/m    Wt Readings from Last 3 Encounters:  01/09/21 95 lb (43.1 kg)  07/10/20 97 lb 6.4 oz (44.2 kg)  06/02/20 100 lb 8 oz (45.6 kg)    General: Well nourished, well developed, in no acute distress Head: Atraumatic, normal size  Eyes: PEERLA, EOMI  Neck: Supple, no JVD Endocrine: No thryomegaly  Cardiac: Normal S1, S2; RRR; no murmurs, rubs, or gallops Lungs: Clear to auscultation bilaterally, no wheezing, rhonchi or rales  Abd: Soft, nontender, no hepatomegaly  Ext: No edema, pulses 2+ Musculoskeletal: No deformities, BUE and BLE strength normal and equal Skin: Warm and dry, no rashes   Neuro: Alert and oriented to person, place, time, and situation, CNII-XII grossly intact, no focal deficits  Psych: Normal mood and affect   ASSESSMENT:   Angelica [name] is a 65 y.o. female who presents for the following: 1. Chest pain, unspecified type   2. Coronary artery disease of native artery of native heart with stable angina pectoris ([facility])   3. Atherosclerosis of native coronary artery of native heart without angina pectoris   4. Coronary artery disease due to lipid rich plaque   5. Hyperlipidemia, unspecified hyperlipidemia type     PLAN:   1. Chest pain, unspecified type -Presents with possible cardiac chest pain.  Known history of drug-eluting stent to the proximal LAD in 2012.  Numerous heart catheterization since that time have been unremarkable.  Most recent stress test in 2020 normal.  Her symptoms are really atypical for her prior symptoms.  Her EKG shows an old anterior infarct and no acute ischemic changes.  Her EKG is unchanged from March 2022.  Really unclear if this actually represents angina.  Her pain is different from prior pain.  She is high risk nonetheless.  I recommended a  exercise nuclear medicine stress test.  She will complete this in the next few days.  If symptoms persist and she has to take nitroglycerin she should do this.  She is had no prolonged symptoms and appears to just have sharp pain that lasts seconds.  We will have her follow-up with Dr. Stanford Breed for further evaluation after this.  2. Coronary artery disease of native artery of native heart with stable angina pectoris [facility]) -Prior history of drug-eluting stent.  Evaluation with stress test as above.  Continue aspirin and statin.  Unclear if her current change in symptoms actually represents angina.  Shared Decision Making/Informed Consent The risks [chest pain, shortness of breath, cardiac arrhythmias, dizziness, blood pressure fluctuations, myocardial infarction, stroke/transient ischemic attack, nausea, vomiting, allergic reaction, radiation exposure, metallic taste sensation and life-threatening complications (estimated to be 1 in 10,000)], benefits (risk stratification, diagnosing coronary artery disease, treatment guidance) and alternatives of a nuclear stress test were discussed in detail with Ms. Bruning and she agrees to proceed.  Disposition: Return if symptoms worsen or fail to improve.  Medication Adjustments/Labs and Tests Ordered: Current medicines are reviewed at length with the patient today.  Concerns regarding medicines are outlined above.  Orders Placed This Encounter  Procedures   MYOCARDIAL PERFUSION IMAGING   EKG 12-Lead    Meds ordered this encounter  Medications   nitroGLYCERIN (NITROSTAT) 0.4 MG SL tablet    Sig: Place 1 tablet (0.4 mg total) under the tongue every 5 (five) minutes as needed for chest pain.    Dispense:  25 tablet    Refill:  3     Patient Instructions  Medication Instructions:  The current medical regimen is effective;  continue present plan and medications.  *If you need a refill on your cardiac medications before your next appointment, please  call your pharmacy*  Testing/Procedures: Your physician has requested that you have en exercise stress myoview. For further information please visit [email or web address]. Please follow instruction sheet, as given.   Follow-Up: At [facility], you and your health  needs are our priority.  As part of our continuing mission to provide you with exceptional heart care, we have created designated Provider Care Teams.  These Care Teams include your primary Cardiologist (physician) and Advanced Practice Providers (APPs -  Physician Assistants and Nurse Practitioners) who all work together to provide you with the care you need, when you need it.  We recommend signing up for the patient portal called "MyChart".  Sign up information is provided on this After Visit Summary.  MyChart is used to connect with patients for Virtual Visits (Telemedicine).  Patients are able to view lab/test results, encounter notes, upcoming appointments, etc.  Non-urgent messages can be sent to your provider as well.   To learn more about what you can do with MyChart, go to [email or web address].    Your next appointment:   Follow up after testing  The format for your next appointment:   In Person  Provider:   Kirk Ruths, MD     Time Spent with Patient: I have spent a total of 35 minutes with patient reviewing hospital notes, telemetry, EKGs, labs and examining the patient as well as establishing an assessment and plan that was discussed with the patient.  > 50% of time was spent in direct patient care.  Signed, Addison Naegeli. Audie Box, MD, [facility]  [address], [place], [place] [address] [phone]  01/09/2021 4:01 PM

## 2021-01-09 ENCOUNTER — Other Ambulatory Visit: Payer: Self-pay

## 2021-01-09 ENCOUNTER — Ambulatory Visit: Payer: Managed Care, Other (non HMO) | Admitting: Cardiovascular Disease

## 2021-01-09 ENCOUNTER — Encounter: Payer: Self-pay | Admitting: Cardiovascular Disease

## 2021-01-09 VITALS — BP 106/68 | HR 69 | Ht 62.0 in | Wt 95.0 lb

## 2021-01-09 DIAGNOSIS — E785 Hyperlipidemia, unspecified: Secondary | ICD-10-CM

## 2021-01-09 DIAGNOSIS — R079 Chest pain, unspecified: Secondary | ICD-10-CM | POA: Diagnosis not present

## 2021-01-09 DIAGNOSIS — I2510 Atherosclerotic heart disease of native coronary artery without angina pectoris: Secondary | ICD-10-CM

## 2021-01-09 DIAGNOSIS — I25118 Atherosclerotic heart disease of native coronary artery with other forms of angina pectoris: Secondary | ICD-10-CM

## 2021-01-09 DIAGNOSIS — I2583 Coronary atherosclerosis due to lipid rich plaque: Secondary | ICD-10-CM

## 2021-01-09 MED ORDER — NITROGLYCERIN 0.4 MG SL SUBL: 0.4000 mg | SUBLINGUAL_TABLET | SUBLINGUAL | 3 refills | Status: DC | PRN

## 2021-01-09 NOTE — Patient Instructions (Signed)
Medication Instructions:  The current medical regimen is effective;  continue present plan and medications.  *If you need a refill on your cardiac medications before your next appointment, please call your pharmacy*  Testing/Procedures: Your physician has requested that you have en exercise stress myoview. For further information please visit [email or web address]. Please follow instruction sheet, as given.   Follow-Up: At [facility], you and your health needs are our priority.  As part of our continuing mission to provide you with exceptional heart care, we have created designated Provider Care Teams.  These Care Teams include your primary Cardiologist (physician) and Advanced Practice Providers (APPs -  Physician Assistants and Nurse Practitioners) who all work together to provide you with the care you need, when you need it.  We recommend signing up for the patient portal called "MyChart".  Sign up information is provided on this After Visit Summary.  MyChart is used to connect with patients for Virtual Visits (Telemedicine).  Patients are able to view lab/test results, encounter notes, upcoming appointments, etc.  Non-urgent messages can be sent to your provider as well.   To learn more about what you can do with MyChart, go to [email or web address].    Your next appointment:   Follow up after testing  The format for your next appointment:   In Person  Provider:   Kirk Ruths, MD

## 2021-01-12 ENCOUNTER — Telehealth (HOSPITAL_COMMUNITY): Payer: Self-pay | Admitting: *Deleted

## 2021-01-12 NOTE — Telephone Encounter (Signed)
Close encounter 

## 2021-01-16 ENCOUNTER — Telehealth (HOSPITAL_COMMUNITY): Payer: Self-pay | Admitting: *Deleted

## 2021-01-16 NOTE — Telephone Encounter (Signed)
Close encounter 

## 2021-01-17 ENCOUNTER — Other Ambulatory Visit: Payer: Self-pay

## 2021-01-17 ENCOUNTER — Ambulatory Visit (HOSPITAL_COMMUNITY)
Admission: RE | Admit: 2021-01-17 | Discharge: 2021-01-17 | Disposition: A | Discharge status: Home / Self Care | Payer: Managed Care, Other (non HMO) | Source: Ambulatory Visit | Attending: Cardiovascular Disease | Admitting: Cardiovascular Disease

## 2021-01-17 DIAGNOSIS — R079 Chest pain, unspecified: Secondary | ICD-10-CM | POA: Insufficient documentation

## 2021-01-17 LAB — MYOCARDIAL PERFUSION IMAGING
Angina Index: 0
Duke Treadmill Score: 9
Estimated workload: 8.7
Exercise duration (min): 8 min
Exercise duration (sec): 36 s
LV dias vol: 68 mL (ref 46–106)
LV sys vol: 26 mL
MPHR: 156 {beats}/min
Nuc Stress EF: 61 %
Peak HR: 146 {beats}/min
Percent HR: 93 %
Rest HR: 71 {beats}/min
Rest Nuclear Isotope Dose: 10.5 mCi
SDS: 6
SRS: 7
SSS: 13
ST Depression (mm): 0 mm
Stress Nuclear Isotope Dose: 29.3 mCi
TID: 0.96

## 2021-01-17 MED ORDER — TECHNETIUM TC 99M TETROFOSMIN IV KIT
10.5000 | PACK | Freq: Once | INTRAVENOUS | Status: AC | PRN
  Administered 2021-01-17: 10.5 via INTRAVENOUS
  Filled 2021-01-17: qty 11

## 2021-01-17 MED ORDER — TECHNETIUM TC 99M TETROFOSMIN IV KIT
29.3000 | PACK | Freq: Once | INTRAVENOUS | Status: AC | PRN
  Administered 2021-01-17: 29.3 via INTRAVENOUS
  Filled 2021-01-17: qty 30

## 2021-03-07 ENCOUNTER — Other Ambulatory Visit: Payer: Self-pay

## 2021-03-07 MED ORDER — EZETIMIBE 10 MG PO TABS: 10.0000 mg | ORAL_TABLET | Freq: Every day | ORAL | 3 refills | Status: AC

## 2021-04-09 NOTE — Progress Notes (Deleted)
HPI: FU coronary disease. Previously seen for chest pain. Stress echo was markedly abnormal with nonsustained ventricular tachycardia and ischemia in the anteroseptal wall and apex. Patient underwent cardiac catheterization in September of 2012 which revealed a 95% ostial LAD lesion. The patient had PCI with a drug-eluting stent. There was some jailing of the circumflex. Repeat cath at [facility] in Dec 2012 because of palpitations and chest pain revealed patency of LAD stent and she had no other significant CAD. Carotid Dopplers December 2012 showed 0-39% bilateral stenosis. 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 Patient has continued to have chest pain. She had repeat catheterization in May of 2014 which revealed normal left main, patent LAD stent, 50% mid LAD. There was a 30-40% ostial circumflex. Ejection fraction was 55%. Again had repeat cath 8/15 that showed no change (nonobstructive CAD). Monitor 8/18 showed sinus with pacs, brief PAT and 6 beats NSVT. Nuclear study September 2022 showed infarct and ejection fraction 61%.  Since last seen   Current Outpatient Medications  Medication Sig Dispense Refill   aspirin EC 81 MG tablet Take 81 mg by mouth every evening.     clonazePAM (KLONOPIN) 0.5 MG tablet Take by mouth.     Coenzyme Q10 (CO Q 10 PO) Take by mouth.     dicyclomine (BENTYL) 20 MG tablet Take 20 mg by mouth 3 (three) times daily.     ergocalciferol (VITAMIN D2) 1.25 MG (50000 UT) capsule TAKE 1 CAPSULE ONCE WEEKLY     ezetimibe (ZETIA) 10 MG tablet Take 1 tablet (10 mg total) by mouth daily. 90 tablet 3   ibuprofen (ADVIL,MOTRIN) 200 MG tablet Take 200-400 mg by mouth every 6 (six) hours as needed for pain or headache.     nitroGLYCERIN (NITROSTAT) 0.4 MG SL tablet Place 1 tablet (0.4 mg total) under the tongue every 5 (five) minutes as needed for chest pain. 25 tablet 3   pravastatin (PRAVACHOL) 40 MG tablet Take by mouth.      sertraline (ZOLOFT) 50 MG tablet Take 1 tablet (50 mg total) by mouth daily. 30 tablet 1   No current facility-administered medications for this visit.     Past Medical History:  Diagnosis Date   ALLERGIC RHINITIS 03/02/2007   BREAST CYST, LEFT 11/23/2009   CAD S/P percutaneous coronary angioplasty 01/2011   a) Exertional Back Pain & High RISK ST) - 95% Ostial LAD --> PCI with Promus DES 3.0 mm x 12 mm; b) LHC 08/2012: LAD stent Patent , 50% mid LAD; c) Neg Myoview 03/2012 d). LHC 12/09/2013 40-50% mid-LAD no progression, medical therapy. e) Neg nuc 2016, 2018.   HLD (hyperlipidemia)    Incomplete left bundle branch block    REACTIVE AIRWAY DISEASE 10/21/2008    Past Surgical History:  Procedure Laterality Date   AUGMENTATION MAMMAPLASTY Bilateral 1990   BREAST BIOPSY     BREAST SURGERY     BILAT IMPLANTS   LEFT HEART CATHETERIZATION WITH CORONARY ANGIOGRAM N/A 09/10/2012   Procedure: LEFT HEART CATHETERIZATION WITH CORONARY ANGIOGRAM;  Surgeon: Sherren Mocha, MD;  Location: [facility] CATH LAB;  Service: Cardiovascular;  Laterality: N/A;   LEFT HEART CATHETERIZATION WITH CORONARY ANGIOGRAM N/A 12/09/2013   Procedure: LEFT HEART CATHETERIZATION WITH CORONARY ANGIOGRAM;  Surgeon: Leonie Man, MD;  Location: [facility] CATH LAB;  Service: Cardiovascular;  Laterality: N/A;   PERCUTANEOUS CORONARY STENT INTERVENTION (PCI-S)  01/2011   Promus Premier DES 3.0 mm x12  mm Ostial LAD (extends into LM with partial "jailing" of Cx)    Social History   Socioeconomic History   Marital status: Married    Spouse name: Not on file   Number of children: Not on file   Years of education: Not on file   Highest education level: Not on file  Occupational History    Employer: [employer]    Comment: Education officer, environmental  Tobacco Use   Smoking status: Never   Smokeless tobacco: Never  Vaping Use   Vaping Use: Never used  Substance and Sexual Activity   Alcohol use: Not Currently    Comment: Occasional  beer   Drug use: No   Sexual activity: Yes    Partners: Male    Birth control/protection: Post-menopausal  Other Topics Concern   Not on file  Social History Narrative   Occupation:    Never Smoked   Alcohol use- no   Married   Drug use- no   Regular Exercise- yes    Social Determinants of Radio broadcast assistant Strain: Not on file  Food Insecurity: Not on file  Transportation Needs: Not on file  Physical Activity: Not on file  Stress: Not on file  Social Connections: Not on file  Intimate Partner Violence: Not on file    Family History  Problem Relation Age of Onset   Alcohol abuse Other    Diabetes Other    Hypertension Other    Heart disease Other     ROS: no fevers or chills, productive cough, hemoptysis, dysphasia, odynophagia, melena, hematochezia, dysuria, hematuria, rash, seizure activity, orthopnea, PND, pedal edema, claudication. Remaining systems are negative.  Physical Exam: Well-developed well-nourished in no acute distress.  Skin is warm and dry.  HEENT is normal.  Neck is supple.  Chest is clear to auscultation with normal expansion.  Cardiovascular exam is regular rate and rhythm.  Abdominal exam nontender or distended. No masses palpated. Extremities show no edema. neuro grossly intact  ECG- personally reviewed  A/P  1 coronary artery disease-continue medical therapy with aspirin and statin.  2 history of chest pain-patient has had difficulties with recurrent chest pain.  Electrocardiogram without new ST changes.  Recent nuclear study showed no significant ischemia.  Plan to continue medical therapy.  3 hypertension-blood pressure controlled.  Continue present medications.  4 hyperlipidemia-continue Zetia and pravastatin.  Check lipids and liver.  5 palpitations-symptoms reasonably well controlled.  Beta-blocker discontinued previously due to fatigue.  Kirk Ruths, MD

## 2021-04-16 ENCOUNTER — Ambulatory Visit: Payer: Managed Care, Other (non HMO) | Admitting: Cardiology

## 2021-05-31 NOTE — Progress Notes (Deleted)
66 y.o. G32P0000 Married Caucasian female here for annual exam.    PCP:     Patient's last menstrual period was 02/21/2013.           Sexually active: {yes no:314532}  The current method of family planning is post menopausal status.    Exercising: {yes no:314532}  {types:19826} Smoker:  no  Health Maintenance: Pap:  06-02-20 Neg:Neg HR HPV, 04-07-17 Neg:Neg HR HPV, 02-21-14 Neg:Neg HR HPV History of abnormal Pap:  no MMG:  08-11-20 Implants/Neg/Birads1 Colonoscopy:  *** BMD:  10-24-20  Result :Osteopenia of hip and forearm TDaP: ***05-06-10 Gardasil:   no TIW:[id number] Hep C: 2016 Neg Screening Labs:  Hb today: ***, Urine today: ***   reports that she has never smoked. She has never used smokeless tobacco. She reports that she does not currently use alcohol. She reports that she does not use drugs.  Past Medical History:  Diagnosis Date   ALLERGIC RHINITIS 03/02/2007   BREAST CYST, LEFT 11/23/2009   CAD S/P percutaneous coronary angioplasty 01/2011   a) Exertional Back Pain & High RISK ST) - 95% Ostial LAD --> PCI with Promus DES 3.0 mm x 12 mm; b) LHC 08/2012: LAD stent Patent , 50% mid LAD; c) Neg Myoview 03/2012 d). LHC 12/09/2013 40-50% mid-LAD no progression, medical therapy. e) Neg nuc 2016, 2018.   HLD (hyperlipidemia)    Incomplete left bundle branch block    REACTIVE AIRWAY DISEASE 10/21/2008    Past Surgical History:  Procedure Laterality Date   AUGMENTATION MAMMAPLASTY Bilateral 1990   BREAST BIOPSY     BREAST SURGERY     BILAT IMPLANTS   LEFT HEART CATHETERIZATION WITH CORONARY ANGIOGRAM N/A 09/10/2012   Procedure: LEFT HEART CATHETERIZATION WITH CORONARY ANGIOGRAM;  Surgeon: Sherren Mocha, MD;  Location: [facility] CATH LAB;  Service: Cardiovascular;  Laterality: N/A;   LEFT HEART CATHETERIZATION WITH CORONARY ANGIOGRAM N/A 12/09/2013   Procedure: LEFT HEART CATHETERIZATION WITH CORONARY ANGIOGRAM;  Surgeon: Leonie Man, MD;  Location: [facility] CATH LAB;  Service: Cardiovascular;   Laterality: N/A;   PERCUTANEOUS CORONARY STENT INTERVENTION (PCI-S)  01/2011   Promus Premier DES 3.0 mm x12 mm Ostial LAD (extends into LM with partial "jailing" of Cx)    Current Outpatient Medications  Medication Sig Dispense Refill   aspirin EC 81 MG tablet Take 81 mg by mouth every evening.     clonazePAM (KLONOPIN) 0.5 MG tablet Take by mouth.     Coenzyme Q10 (CO Q 10 PO) Take by mouth.     dicyclomine (BENTYL) 20 MG tablet Take 20 mg by mouth 3 (three) times daily.     ergocalciferol (VITAMIN D2) 1.25 MG (50000 UT) capsule TAKE 1 CAPSULE ONCE WEEKLY     ezetimibe (ZETIA) 10 MG tablet Take 1 tablet (10 mg total) by mouth daily. 90 tablet 3   ibuprofen (ADVIL,MOTRIN) 200 MG tablet Take 200-400 mg by mouth every 6 (six) hours as needed for pain or headache.     nitroGLYCERIN (NITROSTAT) 0.4 MG SL tablet Place 1 tablet (0.4 mg total) under the tongue every 5 (five) minutes as needed for chest pain. 25 tablet 3   pravastatin (PRAVACHOL) 40 MG tablet Take by mouth.     sertraline (ZOLOFT) 50 MG tablet Take 1 tablet (50 mg total) by mouth daily. 30 tablet 1   No current facility-administered medications for this visit.    Family History  Problem Relation Age of Onset   Alcohol abuse Other    Diabetes Other  Hypertension Other    Heart disease Other     Review of Systems  Exam:   LMP 02/21/2013     General appearance: alert, cooperative and appears stated age Head: normocephalic, without obvious abnormality, atraumatic Neck: no adenopathy, supple, symmetrical, trachea midline and thyroid normal to inspection and palpation Lungs: clear to auscultation bilaterally Breasts: normal appearance, no masses or tenderness, No nipple retraction or dimpling, No nipple discharge or bleeding, No axillary adenopathy Heart: regular rate and rhythm Abdomen: soft, non-tender; no masses, no organomegaly Extremities: extremities normal, atraumatic, no cyanosis or edema Skin: skin color,  texture, turgor normal. No rashes or lesions Lymph nodes: cervical, supraclavicular, and axillary nodes normal. Neurologic: grossly normal  Pelvic: External genitalia:  no lesions              No abnormal inguinal nodes palpated.              Urethra:  normal appearing urethra with no masses, tenderness or lesions              Bartholins and Skenes: normal                 Vagina: normal appearing vagina with normal color and discharge, no lesions              Cervix: no lesions              Pap taken: {yes no:314532} Bimanual Exam:  Uterus:  normal size, contour, position, consistency, mobility, non-tender              Adnexa: no mass, fullness, tenderness              Rectal exam: {yes no:314532}.  Confirms.              Anus:  normal sphincter tone, no lesions  Chaperone was present for exam:  ***  Assessment:   Well woman visit with gynecologic exam.   Plan: Mammogram screening discussed. Self breast awareness reviewed. Pap and HR HPV as above. Guidelines for Calcium, Vitamin D, regular exercise program including cardiovascular and weight bearing exercise.   Follow up annually and prn.   Additional counseling given.  {yes [id number]. _______ minutes face to face time of which over 50% was spent in counseling.    After visit summary provided.

## 2021-06-04 ENCOUNTER — Ambulatory Visit: Payer: Managed Care, Other (non HMO) | Admitting: Obstetrics and Gynecology

## 2021-06-04 DIAGNOSIS — Z0289 Encounter for other administrative examinations: Secondary | ICD-10-CM

## 2021-07-02 ENCOUNTER — Other Ambulatory Visit: Payer: Self-pay | Admitting: Obstetrics and Gynecology

## 2021-07-02 DIAGNOSIS — Z1231 Encounter for screening mammogram for malignant neoplasm of breast: Secondary | ICD-10-CM

## 2021-07-03 NOTE — Progress Notes (Signed)
? ? ? ? ?HPI: FU coronary disease. Previously seen for chest pain. Stress echo was markedly abnormal with nonsustained ventricular tachycardia and ischemia in the anteroseptal wall and apex. Patient underwent cardiac catheterization in September of 2012 which revealed a 95% ostial LAD lesion. The patient had PCI with a drug-eluting stent. There was some jailing of the circumflex. Repeat cath at [facility] in Dec 2012 because of palpitations and chest pain revealed patency of LAD stent and she had no other significant CAD. Carotid Dopplers December 2012 showed 0-39% bilateral stenosis. 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 Patient has continued to have chest pain. She had repeat catheterization in May of 2014 which revealed normal left main, patent LAD stent, 50% mid LAD. There was a 30-40% ostial circumflex. Ejection fraction was 55%. Again had repeat cath 8/15 that showed no change (nonobstructive CAD). Monitor 8/18 showed sinus with pacs, brief PAT and 6 beats NSVT. Nuclear study September 2022 showed ejection fraction 61%.  There was prior infarct with what was described as subtle peri-infarct ischemia.  Since last seen she occasionally has some back and chest pain for seconds but is unchanged.  She does not have exertional chest pain.  She denies dyspnea or syncope. ? ?Current Outpatient Medications  ?Medication Sig Dispense Refill  ? aspirin EC 81 MG tablet Take 81 mg by mouth every evening.    ? clonazePAM (KLONOPIN) 0.5 MG tablet Take by mouth.    ? Coenzyme Q10 (CO Q 10 PO) Take by mouth.    ? dicyclomine (BENTYL) 20 MG tablet Take 20 mg by mouth 3 (three) times daily.    ? ergocalciferol (VITAMIN D2) 1.25 MG (50000 UT) capsule TAKE 1 CAPSULE ONCE WEEKLY    ? ezetimibe (ZETIA) 10 MG tablet Take 1 tablet (10 mg total) by mouth daily. 90 tablet 3  ? ibuprofen (ADVIL,MOTRIN) 200 MG tablet Take 200-400 mg by mouth every 6 (six) hours as needed for pain or  headache.    ? nitroGLYCERIN (NITROSTAT) 0.4 MG SL tablet Place 1 tablet (0.4 mg total) under the tongue every 5 (five) minutes as needed for chest pain. 25 tablet 3  ? pravastatin (PRAVACHOL) 40 MG tablet Take by mouth.    ? sertraline (ZOLOFT) 50 MG tablet Take 1 tablet (50 mg total) by mouth daily. 30 tablet 1  ? ?No current facility-administered medications for this visit.  ? ? ? ?Past Medical History:  ?Diagnosis Date  ? ALLERGIC RHINITIS 03/02/2007  ? BREAST CYST, LEFT 11/23/2009  ? CAD S/P percutaneous coronary angioplasty 01/2011  ? a) Exertional Back Pain & High RISK ST) - 95% Ostial LAD --> PCI with Promus DES 3.0 mm x 12 mm; b) LHC 08/2012: LAD stent Patent , 50% mid LAD; c) Neg Myoview 03/2012 d). LHC 12/09/2013 40-50% mid-LAD no progression, medical therapy. e) Neg nuc 2016, 2018.  ? HLD (hyperlipidemia)   ? Incomplete left bundle branch block   ? REACTIVE AIRWAY DISEASE 10/21/2008  ? ? ?Past Surgical History:  ?Procedure Laterality Date  ? AUGMENTATION MAMMAPLASTY Bilateral 1990  ? BREAST BIOPSY    ? BREAST SURGERY    ? BILAT IMPLANTS  ? LEFT HEART CATHETERIZATION WITH CORONARY ANGIOGRAM N/A 09/10/2012  ? Procedure: LEFT HEART CATHETERIZATION WITH CORONARY ANGIOGRAM;  Surgeon: Sherren Mocha, MD;  Location: [facility] CATH LAB;  Service: Cardiovascular;  Laterality: N/A;  ? LEFT HEART CATHETERIZATION WITH CORONARY ANGIOGRAM N/A 12/09/2013  ? Procedure: LEFT HEART CATHETERIZATION  WITH CORONARY ANGIOGRAM;  Surgeon: Leonie Man, MD;  Location: [facility] CATH LAB;  Service: Cardiovascular;  Laterality: N/A;  ? PERCUTANEOUS CORONARY STENT INTERVENTION (PCI-S)  01/2011  ? Promus Premier DES 3.0 mm x12 mm Ostial LAD (extends into LM with partial "jailing" of Cx)  ? ? ?Social History  ? ?Socioeconomic History  ? Marital status: Married  ?  Spouse name: Not on file  ? Number of children: Not on file  ? Years of education: Not on file  ? Highest education level: Not on file  ?Occupational History  ?  Employer: [employer]  ?   Comment: Health Nurse, children's  ?Tobacco Use  ? Smoking status: Never  ? Smokeless tobacco: Never  ?Vaping Use  ? Vaping Use: Never used  ?Substance and Sexual Activity  ? Alcohol use: Not Currently  ?  Comment: Occasional beer  ? Drug use: No  ? Sexual activity: Yes  ?  Partners: Male  ?  Birth control/protection: Post-menopausal  ?Other Topics Concern  ? Not on file  ?Social History Narrative  ? Occupation:   ? Never Smoked  ? Alcohol use- no  ? Married  ? Drug use- no  ? Regular Exercise- yes   ? ?Social Determinants of Health  ? ?Financial Resource Strain: Not on file  ?Food Insecurity: Not on file  ?Transportation Needs: Not on file  ?Physical Activity: Not on file  ?Stress: Not on file  ?Social Connections: Not on file  ?Intimate Partner Violence: Not on file  ? ? ?Family History  ?Problem Relation Age of Onset  ? Alcohol abuse Other   ? Diabetes Other   ? Hypertension Other   ? Heart disease Other   ? ? ?ROS: no fevers or chills, productive cough, hemoptysis, dysphasia, odynophagia, melena, hematochezia, dysuria, hematuria, rash, seizure activity, orthopnea, PND, pedal edema, claudication. Remaining systems are negative. ? ?Physical Exam: ?Well-developed well-nourished in no acute distress.  ?Skin is warm and dry.  ?HEENT is normal.  ?Neck is supple.  ?Chest is clear to auscultation with normal expansion.  ?Cardiovascular exam is regular rate and rhythm.  ?Abdominal exam nontender or distended. No masses palpated. ?Extremities show no edema. ?neuro grossly intact ? ?ECG-normal sinus rhythm at a rate of 71, nonspecific ST changes.  Cannot rule out septal infarct.  Not significantly changed compared to January 09, 2021.  Personally reviewed ? ?A/P ? ?1 chest pain-patient has had difficulties with recurrent atypical chest pain.  Follow-up catheterizations have not revealed obstructive coronary disease and most recent nuclear study without high risk features.  Plan to continue medical therapy. ? ?2 coronary  artery disease-continue medical therapy with aspirin and statin. ? ?3 hypertension-blood pressure controlled on no medications. ? ?4 hyperlipidemia-patient had significant elevation in liver functions with Crestor 40 mg daily.  We will continue pravastatin and Zetia.  Recent cholesterol 231 with HDL 110 and LDL 111.  I will refer to lipid clinic to see if she would qualify for Repatha or Praluent (cost would be an issue). ? ?5 palpitations-no recurrent symptoms.  Beta-blocker discontinued previously due to fatigue. ? ?Kirk Ruths, MD ? ? ? ?

## 2021-07-05 ENCOUNTER — Encounter: Payer: Self-pay | Admitting: Cardiology

## 2021-07-05 ENCOUNTER — Other Ambulatory Visit: Payer: Self-pay

## 2021-07-05 ENCOUNTER — Ambulatory Visit (INDEPENDENT_AMBULATORY_CARE_PROVIDER_SITE_OTHER): Payer: Medicare Other | Admitting: Cardiology

## 2021-07-05 VITALS — BP 134/76 | HR 71 | Ht 62.0 in | Wt 97.0 lb

## 2021-07-05 DIAGNOSIS — I2510 Atherosclerotic heart disease of native coronary artery without angina pectoris: Secondary | ICD-10-CM

## 2021-07-05 DIAGNOSIS — E785 Hyperlipidemia, unspecified: Secondary | ICD-10-CM | POA: Diagnosis not present

## 2021-07-05 DIAGNOSIS — I25118 Atherosclerotic heart disease of native coronary artery with other forms of angina pectoris: Secondary | ICD-10-CM | POA: Diagnosis not present

## 2021-07-05 DIAGNOSIS — I2583 Coronary atherosclerosis due to lipid rich plaque: Secondary | ICD-10-CM | POA: Diagnosis not present

## 2021-07-05 MED ORDER — NITROGLYCERIN 0.4 MG SL SUBL: 0.4000 mg | SUBLINGUAL_TABLET | SUBLINGUAL | 3 refills | Status: AC | PRN

## 2021-07-05 NOTE — Patient Instructions (Signed)
?  Follow-Up: ?At [facility], you and your health needs are our priority.  As part of our continuing mission to provide you with exceptional heart care, we have created designated Provider Care Teams.  These Care Teams include your primary Cardiologist (physician) and Advanced Practice Providers (APPs -  Physician Assistants and Nurse Practitioners) who all work together to provide you with the care you need, when you need it. ? ?We recommend signing up for the patient portal called "MyChart".  Sign up information is provided on this After Visit Summary.  MyChart is used to connect with patients for Virtual Visits (Telemedicine).  Patients are able to view lab/test results, encounter notes, upcoming appointments, etc.  Non-urgent messages can be sent to your provider as well.   ?To learn more about what you can do with MyChart, go to [email or web address].   ? ?Your next appointment:   ?6 month(s) ? ?The format for your next appointment:   ?In Person ? ?Provider:   ?Kirk Ruths, MD   ? ? ?Other Instructions ? ?APPOINTMENT WITH PHARMACIST IN LIPID CLINIC ?

## 2021-07-25 ENCOUNTER — Ambulatory Visit: Payer: Medicare Other

## 2021-07-25 ENCOUNTER — Telehealth: Payer: Self-pay

## 2021-07-25 NOTE — Telephone Encounter (Signed)
Lmom to r/s ?

## 2021-08-10 ENCOUNTER — Ambulatory Visit: Payer: Medicare Other

## 2021-08-15 ENCOUNTER — Other Ambulatory Visit: Payer: Self-pay | Admitting: Obstetrics and Gynecology

## 2021-08-15 ENCOUNTER — Ambulatory Visit: Payer: Self-pay | Admitting: Obstetrics and Gynecology

## 2021-08-15 ENCOUNTER — Ambulatory Visit
Admission: RE | Admit: 2021-08-15 | Discharge: 2021-08-15 | Disposition: A | Discharge status: Home / Self Care | Payer: Medicare Other | Source: Ambulatory Visit

## 2021-08-15 DIAGNOSIS — Z1231 Encounter for screening mammogram for malignant neoplasm of breast: Secondary | ICD-10-CM

## 2021-08-16 ENCOUNTER — Other Ambulatory Visit: Payer: Self-pay | Admitting: Obstetrics and Gynecology

## 2021-08-16 DIAGNOSIS — N644 Mastodynia: Secondary | ICD-10-CM

## 2021-08-22 NOTE — Progress Notes (Deleted)
66 y.o. G63P0000 Married Caucasian female here for annual breast and pelvic exam.    PCP: Anastasia Pall, MD  Patient's last menstrual period was 02/21/2013.           Sexually active: {yes no:314532}  The current method of family planning is post menopausal status.    Exercising: {yes no:314532}  {types:19826} Smoker:  no  Health Maintenance: Pap:  06-02-20 Neg:Neg HR HPV, 04-07-17 Neg:Neg HR HPV, 02-21-14 Neg:Neg HR HPV History of abnormal Pap:  no MMG:  ***08-11-20 Implants/Neg/BiRads1 Colonoscopy:  ***06-14-20?? BMD:   10-24-20   Result  :Osteopenia of hip and forearm TDaP:  ***PCP Gardasil:   n/a HIV: Unsure Hep C: 2016 Neg Screening Labs:  Hb today: ***, Urine today: ***   reports that she has never smoked. She has never used smokeless tobacco. She reports that she does not currently use alcohol. She reports that she does not use drugs.  Past Medical History:  Diagnosis Date   ALLERGIC RHINITIS 03/02/2007   BREAST CYST, LEFT 11/23/2009   CAD S/P percutaneous coronary angioplasty 01/2011   a) Exertional Back Pain & High RISK ST) - 95% Ostial LAD --> PCI with Promus DES 3.0 mm x 12 mm; b) LHC 08/2012: LAD stent Patent , 50% mid LAD; c) Neg Myoview 03/2012 d). LHC 12/09/2013 40-50% mid-LAD no progression, medical therapy. e) Neg nuc 2016, 2018.   HLD (hyperlipidemia)    Incomplete left bundle branch block    REACTIVE AIRWAY DISEASE 10/21/2008    Past Surgical History:  Procedure Laterality Date   AUGMENTATION MAMMAPLASTY Bilateral 1990   BREAST BIOPSY     BREAST SURGERY     BILAT IMPLANTS   LEFT HEART CATHETERIZATION WITH CORONARY ANGIOGRAM N/A 09/10/2012   Procedure: LEFT HEART CATHETERIZATION WITH CORONARY ANGIOGRAM;  Surgeon: Sherren Mocha, MD;  Location: [facility] CATH LAB;  Service: Cardiovascular;  Laterality: N/A;   LEFT HEART CATHETERIZATION WITH CORONARY ANGIOGRAM N/A 12/09/2013   Procedure: LEFT HEART CATHETERIZATION WITH CORONARY ANGIOGRAM;  Surgeon: Leonie Man, MD;   Location: [facility] CATH LAB;  Service: Cardiovascular;  Laterality: N/A;   PERCUTANEOUS CORONARY STENT INTERVENTION (PCI-S)  01/2011   Promus Premier DES 3.0 mm x12 mm Ostial LAD (extends into LM with partial "jailing" of Cx)    Current Outpatient Medications  Medication Sig Dispense Refill   aspirin EC 81 MG tablet Take 81 mg by mouth every evening.     budesonide (ENTOCORT EC) 3 MG 24 hr capsule Take 9 mg by mouth daily.     clonazePAM (KLONOPIN) 0.5 MG tablet Take by mouth. (Patient not taking: Reported on 07/05/2021)     Coenzyme Q10 (CO Q 10 PO) Take by mouth.     colestipol (COLESTID) 1 g tablet Take 2 g by mouth 2 (two) times daily.     dicyclomine (BENTYL) 20 MG tablet Take 20 mg by mouth 3 (three) times daily.     ergocalciferol (VITAMIN D2) 1.25 MG (50000 UT) capsule TAKE 1 CAPSULE ONCE WEEKLY     ezetimibe (ZETIA) 10 MG tablet Take 1 tablet (10 mg total) by mouth daily. 90 tablet 3   ibuprofen (ADVIL,MOTRIN) 200 MG tablet Take 200-400 mg by mouth every 6 (six) hours as needed for pain or headache. (Patient not taking: Reported on 07/05/2021)     nitroGLYCERIN (NITROSTAT) 0.4 MG SL tablet Place 1 tablet (0.4 mg total) under the tongue every 5 (five) minutes as needed for chest pain. 25 tablet 3   pravastatin (PRAVACHOL) 40  MG tablet Take by mouth.     sertraline (ZOLOFT) 50 MG tablet Take 1 tablet (50 mg total) by mouth daily. 30 tablet 1   No current facility-administered medications for this visit.    Family History  Problem Relation Age of Onset   Alcohol abuse Other    Diabetes Other    Hypertension Other    Heart disease Other     Review of Systems  Exam:   LMP 02/21/2013     General appearance: alert, cooperative and appears stated age Head: normocephalic, without obvious abnormality, atraumatic Neck: no adenopathy, supple, symmetrical, trachea midline and thyroid normal to inspection and palpation Lungs: clear to auscultation bilaterally Breasts: normal appearance, no  masses or tenderness, No nipple retraction or dimpling, No nipple discharge or bleeding, No axillary adenopathy Heart: regular rate and rhythm Abdomen: soft, non-tender; no masses, no organomegaly Extremities: extremities normal, atraumatic, no cyanosis or edema Skin: skin color, texture, turgor normal. No rashes or lesions Lymph nodes: cervical, supraclavicular, and axillary nodes normal. Neurologic: grossly normal  Pelvic: External genitalia:  no lesions              No abnormal inguinal nodes palpated.              Urethra:  normal appearing urethra with no masses, tenderness or lesions              Bartholins and Skenes: normal                 Vagina: normal appearing vagina with normal color and discharge, no lesions              Cervix: no lesions              Pap taken: {yes no:314532} Bimanual Exam:  Uterus:  normal size, contour, position, consistency, mobility, non-tender              Adnexa: no mass, fullness, tenderness              Rectal exam: {yes no:314532}.  Confirms.              Anus:  normal sphincter tone, no lesions  Chaperone was present for exam:  ***  Assessment:   Well woman visit with gynecologic exam.   Plan: Mammogram screening discussed. Self breast awareness reviewed. Pap and HR HPV as above. Guidelines for Calcium, Vitamin D, regular exercise program including cardiovascular and weight bearing exercise.   Follow up annually and prn.   Additional counseling given.  {yes [id number]. _______ minutes face to face time of which over 50% was spent in counseling.    After visit summary provided.

## 2021-08-28 ENCOUNTER — Ambulatory Visit: Payer: Self-pay | Admitting: Obstetrics and Gynecology

## 2021-09-10 ENCOUNTER — Encounter: Payer: Self-pay | Admitting: Cardiology

## 2021-09-10 ENCOUNTER — Encounter (HOSPITAL_BASED_OUTPATIENT_CLINIC_OR_DEPARTMENT_OTHER): Payer: Self-pay | Admitting: Emergency Medicine

## 2021-09-10 ENCOUNTER — Emergency Department (HOSPITAL_BASED_OUTPATIENT_CLINIC_OR_DEPARTMENT_OTHER)
Admission: EM | Admit: 2021-09-10 | Discharge: 2021-09-10 | Disposition: A | Discharge status: Home / Self Care | Payer: Medicare Other | Attending: Emergency Medicine | Admitting: Emergency Medicine

## 2021-09-10 ENCOUNTER — Emergency Department (HOSPITAL_BASED_OUTPATIENT_CLINIC_OR_DEPARTMENT_OTHER): Payer: Medicare Other

## 2021-09-10 ENCOUNTER — Other Ambulatory Visit: Payer: Self-pay

## 2021-09-10 DIAGNOSIS — R072 Precordial pain: Secondary | ICD-10-CM | POA: Insufficient documentation

## 2021-09-10 DIAGNOSIS — Z7982 Long term (current) use of aspirin: Secondary | ICD-10-CM | POA: Insufficient documentation

## 2021-09-10 DIAGNOSIS — R7989 Other specified abnormal findings of blood chemistry: Secondary | ICD-10-CM | POA: Diagnosis not present

## 2021-09-10 DIAGNOSIS — R079 Chest pain, unspecified: Secondary | ICD-10-CM | POA: Diagnosis present

## 2021-09-10 LAB — BASIC METABOLIC PANEL
Anion gap: 7 (ref 5–15)
BUN: 9 mg/dL (ref 8–23)
CO2: 27 mmol/L (ref 22–32)
Calcium: 9.3 mg/dL (ref 8.9–10.3)
Chloride: 105 mmol/L (ref 98–111)
Creatinine, Ser: 0.48 mg/dL (ref 0.44–1.00)
GFR, Estimated: >60 mL/min (ref >60)
Glucose, Bld: 113 mg/dL — ABNORMAL HIGH (ref 70–99)
Potassium: 4 mmol/L (ref 3.5–5.1)
Sodium: 139 mmol/L (ref 135–145)

## 2021-09-10 LAB — CBC
HCT: 41.8 % (ref 36.0–46.0)
Hemoglobin: 14 g/dL (ref 12.0–15.0)
MCH: 34.6 pg — ABNORMAL HIGH (ref 26.0–34.0)
MCHC: 33.5 g/dL (ref 30.0–36.0)
MCV: 103.2 fL — ABNORMAL HIGH (ref 80.0–100.0)
Platelets: 295 10*3/uL (ref 150–400)
RBC: 4.05 MIL/uL (ref 3.87–5.11)
RDW: 13.8 % (ref 11.5–15.5)
WBC: 8.2 10*3/uL (ref 4.0–10.5)
nRBC: 0 % (ref 0.0–0.2)

## 2021-09-10 LAB — D-DIMER, QUANTITATIVE: D-Dimer, Quant: 0.37 ug/mL-FEU (ref 0.00–0.50)

## 2021-09-10 LAB — TROPONIN I (HIGH SENSITIVITY)
Troponin I (High Sensitivity): <2 ng/L (ref <18)
Troponin I (High Sensitivity): 2 ng/L (ref <18)

## 2021-09-10 NOTE — Discharge Instructions (Signed)
Please read and follow all provided instructions. ? ?Your diagnoses today include:  ?1. Precordial pain   ? ? ?Tests performed today include: ?An EKG of your heart ?A chest x-ray ?Cardiac enzymes - a blood test for heart muscle damage ?Blood counts and electrolytes ?D-dimer: screening test for blood clot was normal ?Vital signs. See below for your results today.  ? ?Medications prescribed:  ?None ? ?Take any prescribed medications only as directed. ? ?Follow-up instructions: ?Please follow-up with your cardiologist in the next 3 days for recheck of your symptoms.   ? ?Return instructions:  ?SEEK IMMEDIATE MEDICAL ATTENTION IF: ?You have severe chest pain, especially if the pain is crushing or pressure-like and spreads to the arms, back, neck, or jaw, or if you have sweating, nausea or vomiting, or trouble with breathing. THIS IS AN EMERGENCY. Do not wait to see if the pain will go away. Get medical help at once. Call 911. DO NOT drive yourself to the hospital.  ?Your chest pain gets worse and does not go away after a few minutes of rest.  ?You have an attack of chest pain lasting longer than what you usually experience.  ?You have significant dizziness, if you pass out, or have trouble walking.  ?You have chest pain not typical of your usual pain for which you originally saw your caregiver.  ?You have any other emergent concerns regarding your health. ? ?Additional Information: ?Chest pain comes from many different causes. Your caregiver has diagnosed you as having chest pain that is not specific for one problem, but does not require admission.  You are at low risk for an acute heart condition or other serious illness.  ? ?Your vital signs today were: ?BP (!) 125/102   Pulse (!) 110   Temp 98 ?F (36.7 ?C) (Oral)   Resp (!) 23   Ht '5\' 2"'$  (1.575 m)   Wt 44.5 kg   LMP 02/21/2013   SpO2 100%   BMI 17.92 kg/m?  ?If your blood pressure (BP) was elevated above 135/85 this visit, please have this repeated by your  doctor within one month. ?-------------- ? ? ?

## 2021-09-10 NOTE — ED Triage Notes (Signed)
Pt states it hurts to take a deep breath. Took 2 baby aspirins prior to arrival.  ? ?

## 2021-09-10 NOTE — Telephone Encounter (Signed)
Spoke to patient she stated she is presently at [facility] ED.Stated ED Dr.wanted her to call Dr.Crenshaw and schedule a heart cath.Appointment scheduled with Diona Browner PA 5/10 at 2:15 pm. ?

## 2021-09-10 NOTE — ED Triage Notes (Signed)
Pt states chest pain started 3 hours ago, right sided shoulder pain and right side jaw pain. Also complains of bump on left cheek that is painful to touch. Pt has had cardiac stents placed in the past and stated that this pain is similar to pain at that time.  ?

## 2021-09-10 NOTE — ED Provider Notes (Signed)
?[facility] EMERGENCY DEPARTMENT ?Provider Note ? ? ?CSN: [id number] ?Arrival date & time: 09/10/21  1239 ? ?  ? ?History ? ?Chief Complaint  ?Patient presents with  ? Chest Pain  ? ? ?Angelica [name] is a 66 y.o. female. ? ?Patient with history of ACS status post stenting, most recent nuclear stress test stable in 01/2021, 95% ostial LAD lesion in 01/2011, cath 08/15 no change/non obstructive CAD per cardiology note, follows with Dr. Stanford Breed --presents to the emergency department after waking up around 7 AM today not feeling well.  Patient developed anterior chest pain, nonexertional, that gradually worsened.  Per husband, patient was doing pretty poorly and asked to go to the emergency department.  Symptoms have improved during her stay here.  She had associated shortness of breath.  Pain radiates into her right jaw and right shoulder.  She also feels it in her back, worse with deep breathing.  She has taken aspirin prior to arrival. Patient denies risk factors for pulmonary embolism including: unilateral leg swelling, history of DVT/PE/other blood clots, use of exogenous hormones, recent immobilizations, recent surgery, recent travel (>4hr segment), malignancy, hemoptysis.  ? ? ? ? ?  ? ?Home Medications ?Prior to Admission medications   ?Medication Sig Start Date End Date Taking? Authorizing Provider  ?aspirin EC 81 MG tablet Take 81 mg by mouth every evening.    [provider]  ?budesonide (ENTOCORT EC) 3 MG 24 hr capsule Take 9 mg by mouth daily. 04/14/21   [provider]  ?clonazePAM (KLONOPIN) 0.5 MG tablet Take by mouth. ?Patient not taking: Reported on 07/05/2021 01/16/20   [provider]  ?Coenzyme Q10 (CO Q 10 PO) Take by mouth.    [provider]  ?colestipol (COLESTID) 1 g tablet Take 2 g by mouth 2 (two) times daily. 05/12/21   [provider]  ?dicyclomine (BENTYL) 20 MG tablet Take 20 mg by mouth 3 (three) times daily. 02/21/20   [provider]  ?ergocalciferol (VITAMIN D2) 1.25 MG (50000 UT) capsule TAKE 1 CAPSULE ONCE WEEKLY 05/24/20   [provider]  ?ezetimibe (ZETIA) 10 MG tablet Take 1 tablet (10 mg total) by mouth daily. 03/07/21 07/05/21  Lelon Perla, MD  ?ibuprofen (ADVIL,MOTRIN) 200 MG tablet Take 200-400 mg by mouth every 6 (six) hours as needed for pain or headache. ?Patient not taking: Reported on 07/05/2021    [provider]  ?nitroGLYCERIN (NITROSTAT) 0.4 MG SL tablet Place 1 tablet (0.4 mg total) under the tongue every 5 (five) minutes as needed for chest pain. 07/05/21   Lelon Perla, MD  ?pravastatin (PRAVACHOL) 40 MG tablet Take by mouth. 05/29/20   [provider]  ?sertraline (ZOLOFT) 50 MG tablet Take 1 tablet (50 mg total) by mouth daily. 09/20/19   Nunzio Cobbs, MD  ?   ? ?Allergies    ?Prednisone and Crestor [rosuvastatin]   ? ?Review of Systems   ?Review of Systems ? ?Physical Exam ?Updated Vital Signs ?BP 135/79   Pulse 87   Temp 98 ?F (36.7 ?C) (Oral)   Resp 18   Ht '5\' 2"'$  (1.575 m)   Wt 44.5 kg   LMP 02/21/2013   SpO2 100%   BMI 17.92 kg/m?  ? ?Physical Exam ?Vitals and nursing note reviewed.  ?Constitutional:   ?   Appearance: She is well-developed. She is not diaphoretic.  ?HENT:  ?   Head: Normocephalic and atraumatic.  ?   Mouth/Throat:  ?  Mouth: Mucous membranes are not dry.  ?Eyes:  ?   Conjunctiva/sclera: Conjunctivae normal.  ?Neck:  ?   Vascular: Normal carotid pulses. No JVD.  ?   Trachea: Trachea normal. No tracheal deviation.  ?Cardiovascular:  ?   Rate and Rhythm: Normal rate and regular rhythm.  ?   Pulses: No decreased pulses.     ?     Radial pulses are 2+ on the right side and 2+ on the left side.  ?   Heart sounds: Normal heart sounds, S1 normal and S2 normal. No murmur heard. ?Pulmonary:  ?   Effort: Pulmonary effort is normal. No respiratory distress.  ?   Breath sounds: No wheezing.  ?Chest:  ?   Chest wall: No tenderness.  ?Abdominal:  ?    General: Bowel sounds are normal.  ?   Palpations: Abdomen is soft.  ?   Tenderness: There is no abdominal tenderness. There is no guarding or rebound.  ?Musculoskeletal:     ?   General: Normal range of motion.  ?   Cervical back: Normal range of motion and neck supple. No muscular tenderness.  ?   Right lower leg: No tenderness. No edema.  ?   Left lower leg: No tenderness. No edema.  ?Skin: ?   General: Skin is warm and dry.  ?   Coloration: Skin is not pale.  ?Neurological:  ?   Mental Status: She is alert.  ? ? ?ED Results / Procedures / Treatments   ?Labs ?(all labs ordered are listed, but only abnormal results are displayed) ?Labs Reviewed  ?BASIC METABOLIC PANEL - Abnormal; Notable for the following components:  ?    Result Value  ? Glucose, Bld 113 (*)   ? All other components within normal limits  ?CBC - Abnormal; Notable for the following components:  ? MCV 103.2 (*)   ? MCH 34.6 (*)   ? All other components within normal limits  ?D-DIMER, QUANTITATIVE  ?TROPONIN I (HIGH SENSITIVITY)  ?TROPONIN I (HIGH SENSITIVITY)  ? ? ?EKG ?EKG Interpretation ? ?Date/Time:  Monday Sep 10 2021 12:50:55 EDT ?Ventricular Rate:  108 ?PR Interval:  166 ?QRS Duration: 106 ?QT Interval:  368 ?QTC Calculation: 493 ?R Axis:   -66 ?Text Interpretation: Sinus tachycardia Left axis deviation Anteroseptal infarct , age undetermined T wave abnormality, consider lateral ischemia Abnormal ECG Questionable left bundle branch block When compared with ECG of 28-Dec-2018 12:14, PREVIOUS ECG IS PRESENT Confirmed by Fredia Sorrow [phone]) on 09/10/2021 1:01:56 PM ? ?Radiology ?DG Chest 2 View ? ?Result Date: 09/10/2021 ?CLINICAL DATA:  Chest pain for 3 hours extending into the right shoulder and right jaw. EXAM: CHEST - 2 VIEW COMPARISON:  Radiographs 01/02/2011. FINDINGS: The heart size and mediastinal contours are normal. The lungs are clear. There is no pleural effusion or pneumothorax. No acute osseous findings are identified. Partially  calcified breast implants are noted bilaterally. There are mild degenerative changes in the spine. IMPRESSION: No evidence of active cardiopulmonary process. Electronically Signed   By: Richardean Sale M.D.   On: 09/10/2021 13:18   ? ?Procedures ?Procedures  ? ? ?Medications Ordered in ED ?Medications - No data to display ? ?ED Course/ Medical Decision Making/ A&P ?  ? ?Patient seen and examined. History obtained directly from patient. Work-up including labs, imaging, EKG ordered in triage, if performed, were reviewed.   ? ?Labs/EKG: Independently reviewed and interpreted.  This included: CBC with elevated MCV but no anemia; BMP unremarkable; first  troponin less than 2.  EKG personally reviewed.  Given pleuritic nature, will add D-dimer.  Awaiting second troponin at current time. ? ?Imaging: Independently visualized and interpreted.  This included: Chest x-ray, agree negative. ? ?Medications/Fluids: None ordered. ? ?Most recent vital signs reviewed and are as follows: ?BP 135/79   Pulse 87   Temp 98 ?F (36.7 ?C) (Oral)   Resp 18   Ht '5\' 2"'$  (1.575 m)   Wt 44.5 kg   LMP 02/21/2013   SpO2 100%   BMI 17.92 kg/m?  ? ?Initial impression: Chest pain ? ?Patient was discussed with and seen by Dr. Campbell Stall.  Agrees with current plan.  Comfortable with discharge if D-dimer is negative. ? ? ? ?Reassessment performed. Patient appears comfortable.  Minimal pain at this point. ? ?Labs personally reviewed and interpreted including: Second troponin 2, D-dimer negative. ? ?Reviewed pertinent lab work and imaging with patient at bedside. Questions answered.  ? ?Most current vital signs reviewed and are as follows: ?BP 137/83   Pulse 94   Temp 98 ?F (36.7 ?C) (Oral)   Resp (!) 21   Ht '5\' 2"'$  (1.575 m)   Wt 44.5 kg   LMP 02/21/2013   SpO2 100%   BMI 17.92 kg/m?  ? ?Plan: Discharge to home.  ? ?Prescriptions written for: None. ? ?Other home care instructions discussed: Rest, avoidance of vigorous activity. ? ?ED return  instructions discussed: Return and follow-up instructions: I encouraged patient to return to ED with severe chest pain, especially if the pain is crushing or pressure-like and spreads to the arms, back, neck, or jaw, or if they hav

## 2021-09-11 ENCOUNTER — Ambulatory Visit: Payer: Medicare Other

## 2021-09-12 ENCOUNTER — Ambulatory Visit (INDEPENDENT_AMBULATORY_CARE_PROVIDER_SITE_OTHER): Payer: Medicare Other | Admitting: Nurse Practitioner

## 2021-09-12 ENCOUNTER — Encounter: Payer: Self-pay | Admitting: Nurse Practitioner

## 2021-09-12 VITALS — BP 120/90 | HR 70 | Ht 62.0 in | Wt 98.2 lb

## 2021-09-12 DIAGNOSIS — I2510 Atherosclerotic heart disease of native coronary artery without angina pectoris: Secondary | ICD-10-CM

## 2021-09-12 DIAGNOSIS — R079 Chest pain, unspecified: Secondary | ICD-10-CM

## 2021-09-12 DIAGNOSIS — I10 Essential (primary) hypertension: Secondary | ICD-10-CM

## 2021-09-12 DIAGNOSIS — R002 Palpitations: Secondary | ICD-10-CM | POA: Diagnosis not present

## 2021-09-12 DIAGNOSIS — E785 Hyperlipidemia, unspecified: Secondary | ICD-10-CM

## 2021-09-12 DIAGNOSIS — I6523 Occlusion and stenosis of bilateral carotid arteries: Secondary | ICD-10-CM | POA: Diagnosis not present

## 2021-09-12 NOTE — Addendum Note (Signed)
Addended by: Derrick Ravel on: 1/49/9692 49:32 PM ? ? Modules accepted: Orders ? ?

## 2021-09-12 NOTE — Progress Notes (Signed)
? ? ?Office Visit  ?  ?Patient Name: Angelica [name] ?Date of Encounter: 09/12/2021 ? ?Primary Care Provider:  Chesley Noon, MD ?Primary Cardiologist:  Kirk Ruths, MD ? ?Chief Complaint  ?  ?66 year old female with a history of CAD, bilateral carotid artery stenosis, right radial artery occlusion, palpitations, PACs, PAT, NSVT, hypertension, and hyperlipidemia who presents for follow-up related to CAD and chest pain. ? ?Past Medical History  ?  ?Past Medical History:  ?Diagnosis Date  ? ALLERGIC RHINITIS 03/02/2007  ? BREAST CYST, LEFT 11/23/2009  ? CAD S/P percutaneous coronary angioplasty 01/2011  ? a) Exertional Back Pain & High RISK ST) - 95% Ostial LAD --> PCI with Promus DES 3.0 mm x 12 mm; b) LHC 08/2012: LAD stent Patent , 50% mid LAD; c) Neg Myoview 03/2012 d). LHC 12/09/2013 40-50% mid-LAD no progression, medical therapy. e) Neg nuc 2016, 2018.  ? HLD (hyperlipidemia)   ? Incomplete left bundle branch block   ? REACTIVE AIRWAY DISEASE 10/21/2008  ? ?Past Surgical History:  ?Procedure Laterality Date  ? AUGMENTATION MAMMAPLASTY Bilateral 1990  ? BREAST BIOPSY    ? BREAST SURGERY    ? BILAT IMPLANTS  ? LEFT HEART CATHETERIZATION WITH CORONARY ANGIOGRAM N/A 09/10/2012  ? Procedure: LEFT HEART CATHETERIZATION WITH CORONARY ANGIOGRAM;  Surgeon: Sherren Mocha, MD;  Location: [facility] CATH LAB;  Service: Cardiovascular;  Laterality: N/A;  ? LEFT HEART CATHETERIZATION WITH CORONARY ANGIOGRAM N/A 12/09/2013  ? Procedure: LEFT HEART CATHETERIZATION WITH CORONARY ANGIOGRAM;  Surgeon: Leonie Man, MD;  Location: [facility] CATH LAB;  Service: Cardiovascular;  Laterality: N/A;  ? PERCUTANEOUS CORONARY STENT INTERVENTION (PCI-S)  01/2011  ? Promus Premier DES 3.0 mm x12 mm Ostial LAD (extends into LM with partial "jailing" of Cx)  ? ? ?Allergies ? ?Allergies  ?Allergen Reactions  ? Prednisone Palpitations  ? Crestor [Rosuvastatin] Other (See Comments)  ?  Elevated LFTSs  ? ? ?History of Present Illness  ?  ?66 year old female  with the above past medical history including CAD, bilateral carotid artery stenosis, right radial artery occlusion, palpitations, PACs, PAT, NSVT, hypertension, and hyperlipidemia who presents for follow-up related to CAD and chest pain. ? ?Stress echo in September 2012 was abnormal with nonsustained VT and ischemia in the anteroseptal wall and apex.  She underwent cardiac catheterization in September 2012 which revealed a 95% oLAD lesion s/p DES. There was some jailing of the LCx. Cath at [facility] in December 2012 in the setting of palpitations and chest pain revealed patency of LAD stent, no other significant CAD.  Carotid Dopplers in December 2012 showed 1-39% BICA stenosis.  Doppler in January 2013 showed probable occlusion of right radial with good collaterals.  Continue to have chest pain.  Repeat catheterization in May 2014 revealed normal left main, patent stent to LAD, mLAD 50%, 30-40% oLCx, EF 55%.  Repeat catheterization in 2015 showed no change (nonobstructive CAD).  Patient cardiac monitor in the setting of palpitations showed sinus rhythm with PACs, brief PAT, and 6 beats of NSVT.  Previously on beta-blocker, however this was discontinued due to significant fatigue. Stress test in September 2022 showed EF 61%, prior infarct with what was described as subtle peri-infarct ischemia. She was last seen in the office on 07/05/2021 and reported occasional back and chest pain that last for seconds at a time, unchanged from previous, she denied exertional symptoms concerning for angina. Given reassuring nuclear study in 2022, medical therapy was recommended.  Additionally, she was referred to the lipid  clinic for consideration of PCSK9 inhibitor. ? ?Unfortunately, she presented to the ED on 09/10/2021 with nonexertional anterior chest pain associated shortness of breath, rating to her right jaw, right shoulder, and back, worse with deep breathing. Symptoms improved upon arrival to the ED.  EKG was unchanged from  prior, troponin was negative, D-dimer was negative, and chest x-ray was unremarkable. She was discharged home in stable condition and advised follow-up with cardiology as an outpatient. ? ?She presents today for follow-up accompanied by her husband.  Since her recent ED visit he has been stable overall from a cardiac standpoint.  She reports that she felt very fatigued the day after her ED visit, however, she denies any further chest pain, denies additional symptoms concerning for angina. She is concerned that her symptoms were somewhat similar to her prior anginal equivalent, the difference was that they were less intense this time. Other than her recent episode of chest pain, she denies any additional concerns today. ? ?Home Medications  ?  ?Current Outpatient Medications  ?Medication Sig Dispense Refill  ? aspirin EC 81 MG tablet Take 81 mg by mouth every evening.    ? budesonide (ENTOCORT EC) 3 MG 24 hr capsule Take 9 mg by mouth daily.    ? clonazePAM (KLONOPIN) 0.5 MG tablet Take by mouth.    ? Coenzyme Q10 (CO Q 10 PO) Take by mouth.    ? colestipol (COLESTID) 1 g tablet Take 2 g by mouth 2 (two) times daily.    ? dicyclomine (BENTYL) 20 MG tablet Take 20 mg by mouth 3 (three) times daily.    ? ergocalciferol (VITAMIN D2) 1.25 MG (50000 UT) capsule TAKE 1 CAPSULE ONCE WEEKLY    ? ibuprofen (ADVIL,MOTRIN) 200 MG tablet Take 200-400 mg by mouth every 6 (six) hours as needed for pain or headache.    ? Multiple Vitamins-Minerals (MULTI-VITAMIN GUMMIES PO) Take by mouth.    ? nitroGLYCERIN (NITROSTAT) 0.4 MG SL tablet Place 1 tablet (0.4 mg total) under the tongue every 5 (five) minutes as needed for chest pain. 25 tablet 3  ? pravastatin (PRAVACHOL) 40 MG tablet Take by mouth.    ? sertraline (ZOLOFT) 50 MG tablet Take 1 tablet (50 mg total) by mouth daily. 30 tablet 1  ? ezetimibe (ZETIA) 10 MG tablet Take 1 tablet (10 mg total) by mouth daily. 90 tablet 3  ? ?No current facility-administered medications for  this visit.  ?  ? ?Review of Systems  ? ?She denies chest pain, palpitations, dyspnea, pnd, orthopnea, n, v, dizziness, syncope, edema, weight gain, or early satiety. All other systems reviewed and are otherwise negative except as noted above.  ? ?Physical Exam  ?  ?VS:  BP 120/90 (BP Location: Left Arm)   Pulse 70   Ht '5\' 2"'$  (1.575 m)   Wt 98 lb 3.2 oz (44.5 kg)   LMP 02/21/2013   BMI 17.96 kg/m?   ?GEN: Well nourished, well developed, in no acute distress. ?HEENT: normal. ?Neck: Supple, no JVD, carotid bruits, or masses. ?Cardiac: RRR, no murmurs, rubs, or gallops. No clubbing, cyanosis, edema.  Radials/DP/PT 2+ and equal bilaterally.  ?Respiratory:  Respirations regular and unlabored, clear to auscultation bilaterally. ?GI: Soft, nontender, nondistended, BS + x 4. ?MS: no deformity or atrophy. ?Skin: warm and dry, no rash. ?Neuro:  Strength and sensation are intact. ?Psych: Normal affect. ? ?Accessory Clinical Findings  ?  ?ECG personally reviewed by me today - NSR, 70 bpm - no acute changes. ? ?  Lab Results  ?Component Value Date  ? WBC 8.2 09/10/2021  ? HGB 14.0 09/10/2021  ? HCT 41.8 09/10/2021  ? MCV 103.2 (H) 09/10/2021  ? PLT 295 09/10/2021  ? ?Lab Results  ?Component Value Date  ? CREATININE 0.48 09/10/2021  ? BUN 9 09/10/2021  ? NA 139 09/10/2021  ? K 4.0 09/10/2021  ? CL 105 09/10/2021  ? CO2 27 09/10/2021  ? ?Lab Results  ?Component Value Date  ? ALT 28 03/30/2014  ? AST 27 03/30/2014  ? ALKPHOS 42 03/30/2014  ? BILITOT 0.9 03/30/2014  ? ?Lab Results  ?Component Value Date  ? CHOL 145 03/30/2014  ? HDL 80.10 03/30/2014  ? [place] 55 03/30/2014  ? LDLDIRECT 101.2 11/15/2008  ? TRIG 48.0 03/30/2014  ? CHOLHDL 2 03/30/2014  ?  ?Lab Results  ?Component Value Date  ? HGBA1C 5.2 04/08/2011  ? ? ?Assessment & Plan  ? ?1. CAD/chest pain: S/p DES-LAD in 2012. Cath in May 2014 revealed normal left main, patent stent to LAD, mLAD 50%, 30-40% oLCx, EF 55%.  Repeat cath in 2015 showed no change (nonobstructive  CAD). Stress test in September 2022 showed EF 61%, prior infarct with what was described as subtle peri-infarct ischemia. She presented to ED on 09/10/2021 with nonexertional anterior chest pain associate with shortn

## 2021-09-12 NOTE — Patient Instructions (Addendum)
Medication Instructions:  ?Your physician recommends that you continue on your current medications as directed. Please refer to the Current Medication list given to you today. ? ?*If you need a refill on your cardiac medications before your next appointment, please call your pharmacy* ? ? ?Lab Work: ?NONE ordered at this time of appointment  ? ?If you have labs (blood work) drawn today and your tests are completely normal, you will receive your results only by: ?MyChart Message (if you have MyChart) OR ?A paper copy in the mail ?If you have any lab test that is abnormal or we need to change your treatment, we will call you to review the results. ? ? ?Testing/Procedures: ?How to Prepare for Your Cardiac PET/CT Stress Test: ? ?1. Please do not take these medications before your test:  ? ?Medications that may interfere with the cardiac pharmacological stress agent (ex. nitrates or beta-blockers) the day of the exam. ?Theophylline containing medications for 12 hours. ?Dipyridamole 48 hours prior to the test. ?Your remaining medications may be taken with water. ? ?2. Nothing to eat or drink, except water, 3 hours prior to arrival time.   ?NO caffeine/decaffeinated products, or chocolate 12 hours prior to arrival. ? ?3. NO perfume, cologne or lotion ? ?4. Total time is 1 to 2 hours; you may want to bring reading material for the waiting time. ? ?5. Please report to Admitting at the [place] Entrance 60 minutes early for your test. ? [facility] ? [place], [place] [address] ? ?Diabetic Preparation:  ?Hold oral medications. ?You may take NPH and Lantus insulin. ?Do not take Humalog or Humulin R (Regular Insulin) the day of your test. ?Check blood sugars prior to leaving the house. ?If able to eat breakfast prior to 3 hour fasting, you may take all medications, including your insulin, ?Do not worry if you miss your breakfast dose of insulin - start at your next meal. ? ?IF YOU THINK YOU MAY BE PREGNANT,  OR ARE NURSING PLEASE INFORM THE TECHNOLOGIST. ? ?In preparation for your appointment, medication and supplies will be purchased.  Appointment availability is limited, so if you need to cancel or reschedule, please call the Radiology Department at [phone]  24 hours in advance to avoid a cancellation fee of $100.00 ? ?What to Expect After you Arrive: ? ?Once you arrive and check in for your appointment, you will be taken to a preparation room within the Radiology Department.  A technologist or Nurse will obtain your medical history, verify that you are correctly prepped for the exam, and explain the procedure.  Afterwards,  an IV will be started in your arm and electrodes will be placed on your skin for EKG monitoring during the stress portion of the exam. Then you will be escorted to the PET/CT scanner.  There, staff will get you positioned on the scanner and obtain a blood pressure and EKG.  During the exam, you will continue to be connected to the EKG and blood pressure machines.  A small, safe amount of a radioactive tracer will be injected in your IV to obtain a series of pictures of your heart along with an injection of a stress agent.   ? ?After your Exam: ? ?It is recommended that you eat a meal and drink a caffeinated beverage to counter act any effects of the stress agent.  Drink plenty of fluids for the remainder of the day and urinate frequently for the first couple of hours after the  exam.  Your doctor will inform you of your test results within 7-10 business days. ? ?For questions about your test or how to prepare for your test, please call: ?Marchia Bond, Cardiac Imaging Nurse Navigator  ?Gordy Clement, Cardiac Imaging Nurse Navigator ?Office: [phone]  ? ? ?Follow-Up: ?At [facility], you and your health needs are our priority.  As part of our continuing mission to provide you with exceptional heart care, we have created designated Provider Care Teams.  These Care Teams include your  primary Cardiologist (physician) and Advanced Practice Providers (APPs -  Physician Assistants and Nurse Practitioners) who all work together to provide you with the care you need, when you need it. ? ?We recommend signing up for the patient portal called "MyChart".  Sign up information is provided on this After Visit Summary.  MyChart is used to connect with patients for Virtual Visits (Telemedicine).  Patients are able to view lab/test results, encounter notes, upcoming appointments, etc.  Non-urgent messages can be sent to your provider as well.   ?To learn more about what you can do with MyChart, go to [email or web address].   ? ?Your next appointment:   ?6-8 week(s) ? ?The format for your next appointment:   ?In Person ? ?Provider:   ?Diona Browner, NP      ? ? ?Other Instructions ? ? ?Important Information About Sugar ? ? ? ? ? ? ?

## 2021-09-24 ENCOUNTER — Telehealth: Payer: Self-pay | Admitting: Obstetrics and Gynecology

## 2021-09-24 NOTE — Telephone Encounter (Signed)
Please contact patient to make an appointment to see more prior to her diagnostic breast imaging.  I see a notation that she is having breast pain.

## 2021-09-26 NOTE — Telephone Encounter (Signed)
Spoke with patient. Patient is scheduled for Dx breast imaging on 10/05/21 for right breast pain.  Advised patient will need OV prior to appt for evaluation. Patient requesting to move AEX to earlier date, currently scheduled 10/18/21, last AEX 05/2020.   AEX rescheduled to 10/04/21 at 2:30pm, arriving at 2:15. Patient declined earlier appts offered.   Routing to provider for final review. Patient is agreeable to disposition. Will close encounter.

## 2021-10-03 ENCOUNTER — Telehealth: Payer: Self-pay

## 2021-10-03 NOTE — Telephone Encounter (Signed)
Wyatt Portela called patient back and left message for her to call back to r/s her visit with Dr. Quincy Simmonds.

## 2021-10-03 NOTE — Progress Notes (Deleted)
66 y.o. G33P0000 Married Caucasian female here for annual exam.    PCP:  Anastasia Pall, MD  Patient's last menstrual period was 02/21/2013.           Sexually active: {yes no:314532}  The current method of family planning is post menopausal status.    Exercising: {yes no:314532}  {types:19826} Smoker:  no  Health Maintenance: Pap:  06-02-20 Neg:Neg HR HPV, 04-07-17 Neg:Neg HR HPV, 02-21-14 Neg:Neg HR HPV History of abnormal Pap:  no MMG: *** 08-11-20 Implants/Neg/BiRads1 Colonoscopy:  ***06-14-20 BMD: 10-04-20  Result :Osteopenia TDaP:  ***05-06-10 Gardasil:   n/a HIV: Unsure Hep C:2016 Neg Screening Labs:  Hb today: ***, Urine today: ***   reports that she has never smoked. She has never used smokeless tobacco. She reports that she does not currently use alcohol. She reports that she does not use drugs.  Past Medical History:  Diagnosis Date   ALLERGIC RHINITIS 03/02/2007   BREAST CYST, LEFT 11/23/2009   CAD S/P percutaneous coronary angioplasty 01/2011   a) Exertional Back Pain & High RISK ST) - 95% Ostial LAD --> PCI with Promus DES 3.0 mm x 12 mm; b) LHC 08/2012: LAD stent Patent , 50% mid LAD; c) Neg Myoview 03/2012 d). LHC 12/09/2013 40-50% mid-LAD no progression, medical therapy. e) Neg nuc 2016, 2018.   HLD (hyperlipidemia)    Incomplete left bundle branch block    REACTIVE AIRWAY DISEASE 10/21/2008    Past Surgical History:  Procedure Laterality Date   AUGMENTATION MAMMAPLASTY Bilateral 1990   BREAST BIOPSY     BREAST SURGERY     BILAT IMPLANTS   LEFT HEART CATHETERIZATION WITH CORONARY ANGIOGRAM N/A 09/10/2012   Procedure: LEFT HEART CATHETERIZATION WITH CORONARY ANGIOGRAM;  Surgeon: Sherren Mocha, MD;  Location: [facility] CATH LAB;  Service: Cardiovascular;  Laterality: N/A;   LEFT HEART CATHETERIZATION WITH CORONARY ANGIOGRAM N/A 12/09/2013   Procedure: LEFT HEART CATHETERIZATION WITH CORONARY ANGIOGRAM;  Surgeon: Leonie Man, MD;  Location: [facility] CATH LAB;  Service: Cardiovascular;   Laterality: N/A;   PERCUTANEOUS CORONARY STENT INTERVENTION (PCI-S)  01/2011   Promus Premier DES 3.0 mm x12 mm Ostial LAD (extends into LM with partial "jailing" of Cx)    Current Outpatient Medications  Medication Sig Dispense Refill   aspirin EC 81 MG tablet Take 81 mg by mouth every evening.     budesonide (ENTOCORT EC) 3 MG 24 hr capsule Take 9 mg by mouth daily.     clonazePAM (KLONOPIN) 0.5 MG tablet Take by mouth.     Coenzyme Q10 (CO Q 10 PO) Take by mouth.     colestipol (COLESTID) 1 g tablet Take 2 g by mouth 2 (two) times daily.     dicyclomine (BENTYL) 20 MG tablet Take 20 mg by mouth 3 (three) times daily.     ergocalciferol (VITAMIN D2) 1.25 MG (50000 UT) capsule TAKE 1 CAPSULE ONCE WEEKLY     ezetimibe (ZETIA) 10 MG tablet Take 1 tablet (10 mg total) by mouth daily. 90 tablet 3   ibuprofen (ADVIL,MOTRIN) 200 MG tablet Take 200-400 mg by mouth every 6 (six) hours as needed for pain or headache.     Multiple Vitamins-Minerals (MULTI-VITAMIN GUMMIES PO) Take by mouth.     nitroGLYCERIN (NITROSTAT) 0.4 MG SL tablet Place 1 tablet (0.4 mg total) under the tongue every 5 (five) minutes as needed for chest pain. 25 tablet 3   pravastatin (PRAVACHOL) 40 MG tablet Take by mouth.     sertraline (ZOLOFT)  50 MG tablet Take 1 tablet (50 mg total) by mouth daily. 30 tablet 1   No current facility-administered medications for this visit.    Family History  Problem Relation Age of Onset   Alcohol abuse Other    Diabetes Other    Hypertension Other    Heart disease Other     Review of Systems  Exam:   LMP 02/21/2013     General appearance: alert, cooperative and appears stated age Head: normocephalic, without obvious abnormality, atraumatic Neck: no adenopathy, supple, symmetrical, trachea midline and thyroid normal to inspection and palpation Lungs: clear to auscultation bilaterally Breasts: normal appearance, no masses or tenderness, No nipple retraction or dimpling, No  nipple discharge or bleeding, No axillary adenopathy Heart: regular rate and rhythm Abdomen: soft, non-tender; no masses, no organomegaly Extremities: extremities normal, atraumatic, no cyanosis or edema Skin: skin color, texture, turgor normal. No rashes or lesions Lymph nodes: cervical, supraclavicular, and axillary nodes normal. Neurologic: grossly normal  Pelvic: External genitalia:  no lesions              No abnormal inguinal nodes palpated.              Urethra:  normal appearing urethra with no masses, tenderness or lesions              Bartholins and Skenes: normal                 Vagina: normal appearing vagina with normal color and discharge, no lesions              Cervix: no lesions              Pap taken: {yes no:314532} Bimanual Exam:  Uterus:  normal size, contour, position, consistency, mobility, non-tender              Adnexa: no mass, fullness, tenderness              Rectal exam: {yes no:314532}.  Confirms.              Anus:  normal sphincter tone, no lesions  Chaperone was present for exam:  ***  Assessment:   Well woman visit with gynecologic exam.   Plan: Mammogram screening discussed. Self breast awareness reviewed. Pap and HR HPV as above. Guidelines for Calcium, Vitamin D, regular exercise program including cardiovascular and weight bearing exercise.   Follow up annually and prn.   Additional counseling given.  {yes [id number]. _______ minutes face to face time of which over 50% was spent in counseling.    After visit summary provided.

## 2021-10-03 NOTE — Telephone Encounter (Signed)
TBC had left message asking Dr. Quincy Simmonds to sign off on Diag breast imaging order. Dr. Quincy Simmonds recommended visit for breast exam. I called patient and she is scheduled for AEX tomorrow however she woke sick this morning and needs to r/s.  She will cancel her breast imaging appointment until she sees Dr. Quincy Simmonds for breast exam/and is well.  Message sent to [facility] appt desk at patient's request to call her and r/s AEX that was set for tomorrow.

## 2021-10-04 ENCOUNTER — Ambulatory Visit: Payer: Self-pay | Admitting: Obstetrics and Gynecology

## 2021-10-04 NOTE — Telephone Encounter (Signed)
Thank you for the update.  You may close this encounter.  

## 2021-10-05 ENCOUNTER — Other Ambulatory Visit: Payer: Medicare Other

## 2021-10-18 ENCOUNTER — Ambulatory Visit: Payer: Self-pay | Admitting: Obstetrics and Gynecology

## 2021-10-24 ENCOUNTER — Ambulatory Visit: Payer: Medicare Other | Admitting: Nurse Practitioner

## 2021-10-25 NOTE — Progress Notes (Deleted)
66 y.o. G79P0000 Married Caucasian female here for annual breast and pelvic exam.    PCP:  Anastasia Pall, MD  Patient's last menstrual period was 02/03/2013 (approximate).           Sexually active: {yes no:314532}  The current method of family planning is post menopausal status.    Exercising: {yes no:314532}  {types:19826} Smoker:  no  Health Maintenance: Pap:  06-02-20 Neg:Neg HR HPV, 04-07-17 Neg:Neg HR HPV, 02-21-14 Neg:Neg HR HPV History of abnormal Pap:  no MMG:  ***08-11-20 Neg/Birads1 Colonoscopy:  ***06-14-20 [facility] BMD: 10-04-20  Result :Osteopenia TDaP:  ***PCP Gardasil:   n/a HIV: Unsure Hep C:2016 Neg Screening Labs:  Hb today: ***, Urine today: ***   reports that she has never smoked. She has never used smokeless tobacco. She reports that she does not currently use alcohol. She reports that she does not use drugs.  Past Medical History:  Diagnosis Date   ALLERGIC RHINITIS 03/02/2007   BREAST CYST, LEFT 11/23/2009   CAD S/P percutaneous coronary angioplasty 01/2011   a) Exertional Back Pain & High RISK ST) - 95% Ostial LAD --> PCI with Promus DES 3.0 mm x 12 mm; b) LHC 08/2012: LAD stent Patent , 50% mid LAD; c) Neg Myoview 03/2012 d). LHC 12/09/2013 40-50% mid-LAD no progression, medical therapy. e) Neg nuc 2016, 2018.   HLD (hyperlipidemia)    Incomplete left bundle branch block    REACTIVE AIRWAY DISEASE 10/21/2008    Past Surgical History:  Procedure Laterality Date   AUGMENTATION MAMMAPLASTY Bilateral 1990   BREAST BIOPSY     BREAST SURGERY     BILAT IMPLANTS   LEFT HEART CATHETERIZATION WITH CORONARY ANGIOGRAM N/A 09/10/2012   Procedure: LEFT HEART CATHETERIZATION WITH CORONARY ANGIOGRAM;  Surgeon: Sherren Mocha, MD;  Location: [facility] CATH LAB;  Service: Cardiovascular;  Laterality: N/A;   LEFT HEART CATHETERIZATION WITH CORONARY ANGIOGRAM N/A 12/09/2013   Procedure: LEFT HEART CATHETERIZATION WITH CORONARY ANGIOGRAM;  Surgeon: Leonie Man, MD;  Location: [facility] CATH LAB;   Service: Cardiovascular;  Laterality: N/A;   PERCUTANEOUS CORONARY STENT INTERVENTION (PCI-S)  01/2011   Promus Premier DES 3.0 mm x12 mm Ostial LAD (extends into LM with partial "jailing" of Cx)    Current Outpatient Medications  Medication Sig Dispense Refill   aspirin EC 81 MG tablet Take 81 mg by mouth every evening.     budesonide (ENTOCORT EC) 3 MG 24 hr capsule Take 9 mg by mouth daily.     clonazePAM (KLONOPIN) 0.5 MG tablet Take by mouth.     Coenzyme Q10 (CO Q 10 PO) Take by mouth.     colestipol (COLESTID) 1 g tablet Take 2 g by mouth 2 (two) times daily.     dicyclomine (BENTYL) 20 MG tablet Take 20 mg by mouth 3 (three) times daily.     ergocalciferol (VITAMIN D2) 1.25 MG (50000 UT) capsule TAKE 1 CAPSULE ONCE WEEKLY     ezetimibe (ZETIA) 10 MG tablet Take 1 tablet (10 mg total) by mouth daily. 90 tablet 3   ibuprofen (ADVIL,MOTRIN) 200 MG tablet Take 200-400 mg by mouth every 6 (six) hours as needed for pain or headache.     Multiple Vitamins-Minerals (MULTI-VITAMIN GUMMIES PO) Take by mouth.     nitroGLYCERIN (NITROSTAT) 0.4 MG SL tablet Place 1 tablet (0.4 mg total) under the tongue every 5 (five) minutes as needed for chest pain. 25 tablet 3   pravastatin (PRAVACHOL) 40 MG tablet Take by mouth.  sertraline (ZOLOFT) 50 MG tablet Take 1 tablet (50 mg total) by mouth daily. 30 tablet 1   No current facility-administered medications for this visit.    Family History  Problem Relation Age of Onset   Alcohol abuse Other    Diabetes Other    Hypertension Other    Heart disease Other     Review of Systems  Exam:   LMP 02/03/2013 (Approximate)     General appearance: alert, cooperative and appears stated age Head: normocephalic, without obvious abnormality, atraumatic Neck: no adenopathy, supple, symmetrical, trachea midline and thyroid normal to inspection and palpation Lungs: clear to auscultation bilaterally Breasts: normal appearance, no masses or tenderness, No  nipple retraction or dimpling, No nipple discharge or bleeding, No axillary adenopathy Heart: regular rate and rhythm Abdomen: soft, non-tender; no masses, no organomegaly Extremities: extremities normal, atraumatic, no cyanosis or edema Skin: skin color, texture, turgor normal. No rashes or lesions Lymph nodes: cervical, supraclavicular, and axillary nodes normal. Neurologic: grossly normal  Pelvic: External genitalia:  no lesions              No abnormal inguinal nodes palpated.              Urethra:  normal appearing urethra with no masses, tenderness or lesions              Bartholins and Skenes: normal                 Vagina: normal appearing vagina with normal color and discharge, no lesions              Cervix: no lesions              Pap taken: {yes no:314532} Bimanual Exam:  Uterus:  normal size, contour, position, consistency, mobility, non-tender              Adnexa: no mass, fullness, tenderness              Rectal exam: {yes no:314532}.  Confirms.              Anus:  normal sphincter tone, no lesions  Chaperone was present for exam:  ***  Assessment:   Well woman visit with gynecologic exam.   Plan: Mammogram screening discussed. Self breast awareness reviewed. Pap and HR HPV as above. Guidelines for Calcium, Vitamin D, regular exercise program including cardiovascular and weight bearing exercise.   Follow up annually and prn.   Additional counseling given.  {yes [id number]. _______ minutes face to face time of which over 50% was spent in counseling.    After visit summary provided.

## 2021-10-30 ENCOUNTER — Ambulatory Visit: Payer: Self-pay | Admitting: Obstetrics and Gynecology

## 2021-11-13 ENCOUNTER — Ambulatory Visit: Payer: Medicare Other | Admitting: Nurse Practitioner

## 2021-11-13 ENCOUNTER — Ambulatory Visit: Payer: Self-pay | Admitting: Obstetrics and Gynecology

## 2021-11-13 NOTE — Progress Notes (Deleted)
Office Visit    Patient Name: Angelica [name] Date of Encounter: 11/13/2021  Primary Care Provider:  Chesley Noon, MD Primary Cardiologist:  Kirk Ruths, MD  Chief Complaint    66 year old female with a history of CAD, bilateral carotid artery stenosis, right radial artery occlusion, palpitations, PACs, PAT, NSVT, hypertension, and hyperlipidemia who presents for follow-up related to CAD and chest pain.  Past Medical History    Past Medical History:  Diagnosis Date   ALLERGIC RHINITIS 03/02/2007   BREAST CYST, LEFT 11/23/2009   CAD S/P percutaneous coronary angioplasty 01/2011   a) Exertional Back Pain & High RISK ST) - 95% Ostial LAD --> PCI with Promus DES 3.0 mm x 12 mm; b) LHC 08/2012: LAD stent Patent , 50% mid LAD; c) Neg Myoview 03/2012 d). LHC 12/09/2013 40-50% mid-LAD no progression, medical therapy. e) Neg nuc 2016, 2018.   HLD (hyperlipidemia)    Incomplete left bundle branch block    REACTIVE AIRWAY DISEASE 10/21/2008   Past Surgical History:  Procedure Laterality Date   AUGMENTATION MAMMAPLASTY Bilateral 1990   BREAST BIOPSY     BREAST SURGERY     BILAT IMPLANTS   LEFT HEART CATHETERIZATION WITH CORONARY ANGIOGRAM N/A 09/10/2012   Procedure: LEFT HEART CATHETERIZATION WITH CORONARY ANGIOGRAM;  Surgeon: Sherren Mocha, MD;  Location: [facility] CATH LAB;  Service: Cardiovascular;  Laterality: N/A;   LEFT HEART CATHETERIZATION WITH CORONARY ANGIOGRAM N/A 12/09/2013   Procedure: LEFT HEART CATHETERIZATION WITH CORONARY ANGIOGRAM;  Surgeon: Leonie Man, MD;  Location: [facility] CATH LAB;  Service: Cardiovascular;  Laterality: N/A;   PERCUTANEOUS CORONARY STENT INTERVENTION (PCI-S)  01/2011   Promus Premier DES 3.0 mm x12 mm Ostial LAD (extends into LM with partial "jailing" of Cx)    Allergies  Allergies  Allergen Reactions   Prednisone Palpitations   Crestor [Rosuvastatin] Other (See Comments)    Elevated LFTSs    History of Present Illness    66 year old female  with the above past medical history including CAD, bilateral carotid artery stenosis, right radial artery occlusion, palpitations, PACs, PAT, NSVT, hypertension, and hyperlipidemia who presents for follow-up related to CAD and chest pain.   Stress echo in September 2012 was abnormal with nonsustained VT and ischemia in the anteroseptal wall and apex.  She underwent cardiac catheterization in September 2012 which revealed a 95% oLAD lesion s/p DES. There was some jailing of the LCx. Cath at [facility] in December 2012 in the setting of palpitations and chest pain revealed patency of LAD stent, no other significant CAD.  Carotid Dopplers in December 2012 showed 1-39% BICA stenosis.  Doppler in January 2013 showed probable occlusion of right radial with good collaterals.  Continue to have chest pain.  Repeat catheterization in May 2014 revealed normal left main, patent stent to LAD, mLAD 50%, 30-40% oLCx, EF 55%.  Repeat catheterization in 2015 showed no change (nonobstructive CAD).  Patient cardiac monitor in the setting of palpitations showed sinus rhythm with PACs, brief PAT, and 6 beats of NSVT.  Previously on beta-blocker, however this was discontinued due to significant fatigue. Stress test in September 2022 showed EF 61%, prior infarct with what was described as subtle peri-infarct ischemia. She was last seen in the office on 07/05/2021 and reported occasional back and chest pain that last for seconds at a time, unchanged from previous, she denied exertional symptoms concerning for angina. Given reassuring nuclear study in 2022, medical therapy was recommended.  Additionally, she was referred to the lipid  clinic for consideration of PCSK9 inhibitor.   Unfortunately, she presented to the ED on 09/10/2021 with nonexertional anterior chest pain associated shortness of breath, rating to her right jaw, right shoulder, and back, worse with deep breathing. Symptoms improved upon arrival to the ED.  EKG was unchanged from  prior, troponin was negative, D-dimer was negative, and chest x-ray was unremarkable. She was discharged home in stable condition and advised follow-up with cardiology as an outpatient.  She was last seen in the office on 09/12/2021 and denied any further chest pain, though she did state that her symptoms which prompted her ED visit were similar to her prior anginal equivalent.  She was scheduled for cardiac PET stress test on 12/04/2021.  She presents today for follow-up.  Since her last visit    1. CAD/chest pain: S/p DES-LAD in 2012. Cath in May 2014 revealed normal left main, patent stent to LAD, mLAD 50%, 30-40% oLCx, EF 55%.  Repeat cath in 2015 showed no change (nonobstructive CAD). Stress test in September 2022 showed EF 61%, prior infarct with what was described as subtle peri-infarct ischemia. She presented to ED on 09/10/2021 with nonexertional anterior chest pain associate with shortness of breath, radiating to right jaw, right shoulder, and back, worse with deep breathing. EKG in ED was unchanged, troponin was negative, D-dimer is negative, chest x-ray was unremarkable. Cardiac PET stress test pending. Continue aspirin, pravastatin, Zetia.   2. Palpitations: Cardiac monitor in 2018 showed sinus rhythm with PACs, brief PAT, and 6 beats of NSVT.  She reports rare fleeting palpitations. No associated symptoms.  Stable.   3. Carotid artery stenosis: Carotid Dopplers in December 2012 showed 1 -39% BICA stenosis.  Asymptomatic.  No indication for repeat testing at this time.  Continue aspirin, pravastatin, Zetia.   4. Hypertension: BP well controlled.  She is not on any medications for BP at this time.   5. Hyperlipidemia: She was previously referred to the lipid clinic for consideration of PCSK9 inhibitor, however, she missed both appointments. Will send new referral to lipid clinic for follow-up.  Continue aspirin, pravastatin, Zetia.   6. Disposition: Follow-up in    Home Medications     Current Outpatient Medications  Medication Sig Dispense Refill   aspirin EC 81 MG tablet Take 81 mg by mouth every evening.     budesonide (ENTOCORT EC) 3 MG 24 hr capsule Take 9 mg by mouth daily.     clonazePAM (KLONOPIN) 0.5 MG tablet Take by mouth.     Coenzyme Q10 (CO Q 10 PO) Take by mouth.     colestipol (COLESTID) 1 g tablet Take 2 g by mouth 2 (two) times daily.     dicyclomine (BENTYL) 20 MG tablet Take 20 mg by mouth 3 (three) times daily.     ergocalciferol (VITAMIN D2) 1.25 MG (50000 UT) capsule TAKE 1 CAPSULE ONCE WEEKLY     ezetimibe (ZETIA) 10 MG tablet Take 1 tablet (10 mg total) by mouth daily. 90 tablet 3   ibuprofen (ADVIL,MOTRIN) 200 MG tablet Take 200-400 mg by mouth every 6 (six) hours as needed for pain or headache.     Multiple Vitamins-Minerals (MULTI-VITAMIN GUMMIES PO) Take by mouth.     nitroGLYCERIN (NITROSTAT) 0.4 MG SL tablet Place 1 tablet (0.4 mg total) under the tongue every 5 (five) minutes as needed for chest pain. 25 tablet 3   pravastatin (PRAVACHOL) 40 MG tablet Take by mouth.     sertraline (ZOLOFT) 50 MG tablet Take  1 tablet (50 mg total) by mouth daily. 30 tablet 1   No current facility-administered medications for this visit.     Review of Systems    ***.  All other systems reviewed and are otherwise negative except as noted above.    Physical Exam    VS:  LMP 02/03/2013 (Approximate)  , BMI There is no height or weight on file to calculate BMI.     GEN: Well nourished, well developed, in no acute distress. HEENT: normal. Neck: Supple, no JVD, carotid bruits, or masses. Cardiac: RRR, no murmurs, rubs, or gallops. No clubbing, cyanosis, edema.  Radials/DP/PT 2+ and equal bilaterally.  Respiratory:  Respirations regular and unlabored, clear to auscultation bilaterally. GI: Soft, nontender, nondistended, BS + x 4. MS: no deformity or atrophy. Skin: warm and dry, no rash. Neuro:  Strength and sensation are intact. Psych: Normal  affect.  Accessory Clinical Findings    ECG personally reviewed by me today - *** - no acute changes.  Lab Results  Component Value Date   WBC 8.2 09/10/2021   HGB 14.0 09/10/2021   HCT 41.8 09/10/2021   MCV 103.2 (H) 09/10/2021   PLT 295 09/10/2021   Lab Results  Component Value Date   CREATININE 0.48 09/10/2021   BUN 9 09/10/2021   NA 139 09/10/2021   K 4.0 09/10/2021   CL 105 09/10/2021   CO2 27 09/10/2021   Lab Results  Component Value Date   ALT 28 03/30/2014   AST 27 03/30/2014   ALKPHOS 42 03/30/2014   BILITOT 0.9 03/30/2014   Lab Results  Component Value Date   CHOL 145 03/30/2014   HDL 80.10 03/30/2014   LDLCALC 55 03/30/2014   LDLDIRECT 101.2 11/15/2008   TRIG 48.0 03/30/2014   CHOLHDL 2 03/30/2014    Lab Results  Component Value Date   HGBA1C 5.2 04/08/2011    Assessment & Plan    1.  ***   Lenna Sciara, NP 11/13/2021, 6:20 AM

## 2021-12-03 ENCOUNTER — Telehealth (HOSPITAL_COMMUNITY): Payer: Self-pay | Admitting: *Deleted

## 2021-12-03 NOTE — Telephone Encounter (Signed)
Patient returning call regarding upcoming cardiac imaging study; pt verbalizes understanding of appt date/time, parking situation and where to check in, pre-test NPO status, and verified current allergies; name and call back number provided for further questions should they arise  Gordy Clement RN Navigator Cardiac [place] [facility] [phone] office [phone] cell  Patient aware to avoid caffeine after 9pm tonight.

## 2021-12-03 NOTE — Telephone Encounter (Signed)
Attempted to call patient regarding upcoming cardiac PET appointment. Left message on voicemail with name and callback number  Gordy Clement RN Navigator Cardiac Imaging [facility] [phone] Office [phone] Cell  Informed patient on VM to avoid caffeine 12 hours prior to her scan.

## 2021-12-04 ENCOUNTER — Encounter (HOSPITAL_COMMUNITY)
Admission: RE | Admit: 2021-12-04 | Discharge: 2021-12-04 | Disposition: A | Discharge status: Home / Self Care | Payer: Medicare Other | Source: Ambulatory Visit | Attending: Nurse Practitioner | Admitting: Nurse Practitioner

## 2021-12-04 DIAGNOSIS — R079 Chest pain, unspecified: Secondary | ICD-10-CM | POA: Diagnosis present

## 2021-12-04 DIAGNOSIS — I6523 Occlusion and stenosis of bilateral carotid arteries: Secondary | ICD-10-CM | POA: Insufficient documentation

## 2021-12-04 DIAGNOSIS — I10 Essential (primary) hypertension: Secondary | ICD-10-CM | POA: Diagnosis present

## 2021-12-04 DIAGNOSIS — R002 Palpitations: Secondary | ICD-10-CM | POA: Diagnosis present

## 2021-12-04 DIAGNOSIS — I2510 Atherosclerotic heart disease of native coronary artery without angina pectoris: Secondary | ICD-10-CM | POA: Insufficient documentation

## 2021-12-04 DIAGNOSIS — E785 Hyperlipidemia, unspecified: Secondary | ICD-10-CM | POA: Diagnosis present

## 2021-12-04 MED ORDER — RUBIDIUM RB82 GENERATOR (RUBYFILL)
11.9000 | PACK | Freq: Once | INTRAVENOUS | Status: AC
  Administered 2021-12-04: 11.67 via INTRAVENOUS

## 2021-12-04 MED ORDER — RUBIDIUM RB82 GENERATOR (RUBYFILL)
11.9000 | PACK | Freq: Once | INTRAVENOUS | Status: AC
  Administered 2021-12-04: 11.68 via INTRAVENOUS

## 2021-12-04 MED ORDER — REGADENOSON 0.4 MG/5ML IV SOLN
INTRAVENOUS | Status: AC
  Administered 2021-12-04: 0.4 mg via INTRAVENOUS
  Filled 2021-12-04: qty 5

## 2021-12-05 LAB — NM PET CT CARDIAC PERFUSION MULTI W/ABSOLUTE BLOODFLOW
MBFR: 3.14
Nuc Rest EF: 56 %
Nuc Stress EF: 63 %
Peak HR: 108 {beats}/min
Rest HR: 65 {beats}/min
Rest MBF: 1.02 ml/g/min
Rest Nuclear Isotope Dose: 11.7 mCi
Rest perfusion cavity size (mL): 65 mL
ST Depression (mm): 0 mm
Stress MBF: 3.2 ml/g/min
Stress Nuclear Isotope Dose: 11.7 mCi
Stress perfusion cavity size (mL): 66 mL
TID: 0.91

## 2021-12-14 ENCOUNTER — Encounter: Payer: Self-pay | Admitting: Nurse Practitioner

## 2021-12-14 ENCOUNTER — Ambulatory Visit (INDEPENDENT_AMBULATORY_CARE_PROVIDER_SITE_OTHER): Payer: Medicare Other | Admitting: Nurse Practitioner

## 2021-12-14 VITALS — BP 124/78 | HR 81 | Ht 62.0 in | Wt 97.2 lb

## 2021-12-14 DIAGNOSIS — R0609 Other forms of dyspnea: Secondary | ICD-10-CM

## 2021-12-14 DIAGNOSIS — I2510 Atherosclerotic heart disease of native coronary artery without angina pectoris: Secondary | ICD-10-CM

## 2021-12-14 DIAGNOSIS — R002 Palpitations: Secondary | ICD-10-CM | POA: Diagnosis not present

## 2021-12-14 DIAGNOSIS — I10 Essential (primary) hypertension: Secondary | ICD-10-CM

## 2021-12-14 DIAGNOSIS — E785 Hyperlipidemia, unspecified: Secondary | ICD-10-CM

## 2021-12-14 DIAGNOSIS — R072 Precordial pain: Secondary | ICD-10-CM | POA: Diagnosis not present

## 2021-12-14 LAB — HEPATIC FUNCTION PANEL
ALT: 17 IU/L (ref 0–32)
AST: 24 IU/L (ref 0–40)
Albumin: 4.8 g/dL (ref 3.9–4.9)
Alkaline Phosphatase: 63 IU/L (ref 44–121)
Bilirubin Total: 1 mg/dL (ref 0.0–1.2)
Bilirubin, Direct: 0.23 mg/dL (ref 0.00–0.40)
Total Protein: 8 g/dL (ref 6.0–8.5)

## 2021-12-14 LAB — LIPID PANEL
Chol/HDL Ratio: 2.2 ratio (ref 0.0–4.4)
Cholesterol, Total: 249 mg/dL — ABNORMAL HIGH (ref 100–199)
HDL: 111 mg/dL (ref >39)
LDL Chol Calc (NIH): 125 mg/dL — ABNORMAL HIGH (ref 0–99)
Triglycerides: 79 mg/dL (ref 0–149)
VLDL Cholesterol Cal: 13 mg/dL (ref 5–40)

## 2021-12-14 NOTE — Progress Notes (Signed)
Office Visit    Patient Name: Angelica [name] Date of Encounter: 12/14/2021  Primary Care Provider:  Chesley Noon, MD Primary Cardiologist:  Kirk Ruths, MD  Chief Complaint    66 year old female with a history of CAD, bilateral carotid artery stenosis, right radial artery occlusion, palpitations, PACs, PAT, NSVT, hypertension, and hyperlipidemia who presents for follow-up related to CAD, chest pain, and shortness of breath.  Past Medical History    Past Medical History:  Diagnosis Date   ALLERGIC RHINITIS 03/02/2007   BREAST CYST, LEFT 11/23/2009   CAD S/P percutaneous coronary angioplasty 01/2011   a) Exertional Back Pain & High RISK ST) - 95% Ostial LAD --> PCI with Promus DES 3.0 mm x 12 mm; b) LHC 08/2012: LAD stent Patent , 50% mid LAD; c) Neg Myoview 03/2012 d). LHC 12/09/2013 40-50% mid-LAD no progression, medical therapy. e) Neg nuc 2016, 2018.   HLD (hyperlipidemia)    Incomplete left bundle branch block    REACTIVE AIRWAY DISEASE 10/21/2008   Past Surgical History:  Procedure Laterality Date   AUGMENTATION MAMMAPLASTY Bilateral 1990   BREAST BIOPSY     BREAST SURGERY     BILAT IMPLANTS   LEFT HEART CATHETERIZATION WITH CORONARY ANGIOGRAM N/A 09/10/2012   Procedure: LEFT HEART CATHETERIZATION WITH CORONARY ANGIOGRAM;  Surgeon: Sherren Mocha, MD;  Location: [facility] CATH LAB;  Service: Cardiovascular;  Laterality: N/A;   LEFT HEART CATHETERIZATION WITH CORONARY ANGIOGRAM N/A 12/09/2013   Procedure: LEFT HEART CATHETERIZATION WITH CORONARY ANGIOGRAM;  Surgeon: Leonie Man, MD;  Location: [facility] CATH LAB;  Service: Cardiovascular;  Laterality: N/A;   PERCUTANEOUS CORONARY STENT INTERVENTION (PCI-S)  01/2011   Promus Premier DES 3.0 mm x12 mm Ostial LAD (extends into LM with partial "jailing" of Cx)    Allergies  Allergies  Allergen Reactions   Prednisone Palpitations   Crestor [Rosuvastatin] Other (See Comments)    Elevated LFTSs    History of Present Illness     65 year old female with the above past medical history including CAD, bilateral carotid artery stenosis, right radial artery occlusion, palpitations, PACs, PAT, NSVT, hypertension, and hyperlipidemia who presents for follow-up related to CAD and chest pain.   Stress echo in September 2012 was abnormal with nonsustained VT and ischemia in the anteroseptal wall and apex.  She underwent cardiac catheterization in September 2012 which revealed a 95% oLAD lesion s/p DES. There was some jailing of the LCx. Cath at [facility] in December 2012 in the setting of palpitations and chest pain revealed patency of LAD stent, no other significant CAD.  Carotid Dopplers in December 2012 showed 1-39% BICA stenosis.  Doppler in January 2013 showed probable occlusion of right radial with good collaterals. She continued to have chest pain.  Repeat catheterization in May 2014 revealed normal left main, patent stent to LAD, mLAD 50%, 30-40% oLCx, EF 55%. Repeat catheterization in 2015 showed no change (nonobstructive CAD).  Outpatient cardiac monitor in the setting of palpitations showed sinus rhythm with PACs, brief PAT, and 6 beats of NSVT.  Previously on beta-blocker, however this was discontinued due to significant fatigue. Stress test in September 2022 showed EF 61%, prior infarct with what was described as subtle peri-infarct ischemia. She was seen in the office in March 2023 and reported occasional back and chest pain that last for seconds at a time, unchanged from previous, she denied exertional symptoms concerning for angina. Given reassuring nuclear study in 2022, medical therapy was recommended. Additionally, she was referred to the  lipid clinic for consideration of PCSK9 inhibitor.   Unfortunately, she presented to the ED on 09/10/2021 with nonexertional anterior chest pain associated shortness of breath, rating to her right jaw, right shoulder, and back, worse with deep breathing. Symptoms improved upon arrival to the ED.  EKG  was unchanged from prior, troponin was negative, D-dimer was negative, and chest x-ray was unremarkable. She was discharged home in stable condition and advised follow-up with cardiology as an outpatient.   She was last seen in the office on 09/12/2021 and denied any further episodes of chest pain, though she did report some fatigue. Cardiac PET stress test was negative for ischemia, low risk.  She was again referred to the lipid clinic for management of hyperlipidemia.  She presents today for follow-up accompanied by her husband.  Since her last visit has been stable from a cardiac standpoint.  She does note some dyspnea on exertion.  She has not had any recurrent chest pain.  He notes occasional fleeting palpitations, denies any associated symptoms.  She wonders if some of her symptoms could be related to anxiety as she notes significant amount of personal stress.  Other than her dyspnea, she denies any additional concerns today.  Home Medications    Current Outpatient Medications  Medication Sig Dispense Refill   aspirin EC 81 MG tablet Take 81 mg by mouth every evening.     Coenzyme Q10 (CO Q 10 PO) Take by mouth.     colestipol (COLESTID) 1 g tablet Take 2 g by mouth 2 (two) times daily.     ergocalciferol (VITAMIN D2) 1.25 MG (50000 UT) capsule TAKE 1 CAPSULE ONCE WEEKLY     ibuprofen (ADVIL,MOTRIN) 200 MG tablet Take 200-400 mg by mouth every 6 (six) hours as needed for pain or headache.     Multiple Vitamins-Minerals (MULTI-VITAMIN GUMMIES PO) Take by mouth.     nitroGLYCERIN (NITROSTAT) 0.4 MG SL tablet Place 1 tablet (0.4 mg total) under the tongue every 5 (five) minutes as needed for chest pain. 25 tablet 3   pravastatin (PRAVACHOL) 40 MG tablet Take by mouth.     sertraline (ZOLOFT) 50 MG tablet Take 1 tablet (50 mg total) by mouth daily. 30 tablet 1   ezetimibe (ZETIA) 10 MG tablet Take 1 tablet (10 mg total) by mouth daily. 90 tablet 3   No current facility-administered medications  for this visit.     Review of Systems    She denies chest pain, palpitations, dyspnea, pnd, orthopnea, n, v, dizziness, syncope, edema, weight gain, or early satiety. All other systems reviewed and are otherwise negative except as noted above.   Physical Exam    VS:  BP 124/78   Pulse 81   Ht '5\' 2"'$  (1.575 m)   Wt 97 lb 3.2 oz (44.1 kg)   LMP 02/03/2013 (Approximate)   SpO2 98%   BMI 17.78 kg/m   GEN: Well nourished, well developed, in no acute distress. HEENT: normal. Neck: Supple, no JVD, carotid bruits, or masses. Cardiac: RRR, no murmurs, rubs, or gallops. No clubbing, cyanosis, edema.  Radials/DP/PT 2+ and equal bilaterally.  Respiratory:  Respirations regular and unlabored, clear to auscultation bilaterally. GI: Soft, nontender, nondistended, BS + x 4. MS: no deformity or atrophy. Skin: warm and dry, no rash. Neuro:  Strength and sensation are intact. Psych: Normal affect.  Accessory Clinical Findings    ECG personally reviewed by me today - No EKG in office today - no acute changes.  Lab Results  Component Value Date   WBC 8.2 09/10/2021   HGB 14.0 09/10/2021   HCT 41.8 09/10/2021   MCV 103.2 (H) 09/10/2021   PLT 295 09/10/2021   Lab Results  Component Value Date   CREATININE 0.48 09/10/2021   BUN 9 09/10/2021   NA 139 09/10/2021   K 4.0 09/10/2021   CL 105 09/10/2021   CO2 27 09/10/2021   Lab Results  Component Value Date   ALT 28 03/30/2014   AST 27 03/30/2014   ALKPHOS 42 03/30/2014   BILITOT 0.9 03/30/2014   Lab Results  Component Value Date   CHOL 145 03/30/2014   HDL 80.10 03/30/2014   LDLCALC 55 03/30/2014   LDLDIRECT 101.2 11/15/2008   TRIG 48.0 03/30/2014   CHOLHDL 2 03/30/2014    Lab Results  Component Value Date   HGBA1C 5.2 04/08/2011    Assessment & Plan    1. CAD/chest pain/shortness of breath: S/p DES-LAD in 2012. Cath in May 2014 revealed normal left main, patent stent to LAD, mLAD 50%, 30-40% oLCx, EF 55%.  Repeat cath in  2015 showed no change (nonobstructive CAD). Stress test in September 2022 showed EF 61%, prior infarct with what was described as subtle peri-infarct ischemia.  Had an episode of chest pain in May 2023.  Declined antianginal therapy. Cardiac PET stress test showed no evidence of ischemia, low risk, EF 63%.  She has not had any chest pain since her ED visit in May 2023, however, she does note ongoing dyspnea on exertion.  EF was normal on recent PET stress test, however, she is interested in an echocardiogram.  Anxiety possibly contributing to symptoms, however, given ongoing dyspnea, will order echo to rule out any additional structural heart abnormalities.  Continue aspirin, pravastatin, Zetia.  2. Palpitations: Cardiac monitor in 2018 showed sinus rhythm with PACs, brief PAT, and 6 beats of NSVT.  She reports rare fleeting palpitations. No associated symptoms.  Stable.   3. Carotid artery stenosis: Carotid Dopplers in December 2012 showed 1 -39% BICA stenosis.  Asymptomatic.  No indication for repeat testing at this time.  Continue aspirin, pravastatin, Zetia.   4. Hypertension: BP well controlled.  She is not on any medications for BP at this time.   5. Hyperlipidemia: She was previously referred to the lipid clinic for consideration of PCSK9 inhibitor, however, she has missed both appointments.  She would prefer to recheck lipids prior to following up with lipid clinic.  Will repeat fasting lipids, LFTs today.  Continue aspirin, pravastatin, Zetia.   6. Disposition: Follow-up as scheduled with Dr. Stanford Breed in September 2023.  Lenna Sciara, NP 12/14/2021, 10:42 AM

## 2021-12-14 NOTE — Patient Instructions (Signed)
Medication Instructions:  Your physician recommends that you continue on your current medications as directed. Please refer to the Current Medication list given to you today.   *If you need a refill on your cardiac medications before your next appointment, please call your pharmacy*   Lab Work: Your physician recommends that you complete labs today Fasting Lipid panel LFT  If you have labs (blood work) drawn today and your tests are completely normal, you will receive your results only by: MyChart Message (if you have MyChart) OR A paper copy in the mail If you have any lab test that is abnormal or we need to change your treatment, we will call you to review the results.   Testing/Procedures: Your physician has requested that you have an echocardiogram. Echocardiography is a painless test that uses sound waves to create images of your heart. It provides your doctor with information about the size and shape of your heart and how well your heart's chambers and valves are working. This procedure takes approximately one hour. There are no restrictions for this procedure.      Follow-Up: At [facility], you and your health needs are our priority.  As part of our continuing mission to provide you with exceptional heart care, we have created designated Provider Care Teams.  These Care Teams include your primary Cardiologist (physician) and Advanced Practice Providers (APPs -  Physician Assistants and Nurse Practitioners) who all work together to provide you with the care you need, when you need it.  We recommend signing up for the patient portal called "MyChart".  Sign up information is provided on this After Visit Summary.  MyChart is used to connect with patients for Virtual Visits (Telemedicine).  Patients are able to view lab/test results, encounter notes, upcoming appointments, etc.  Non-urgent messages can be sent to your provider as well.   To learn more about what you can do with  MyChart, go to [email or web address].    Your next appointment:   Keep Appointment   The format for your next appointment:   In Person  Provider:   Kirk Ruths, MD     Other Instructions   Important Information About Sugar

## 2021-12-18 NOTE — Progress Notes (Unsigned)
66 y.o. [id number] Married Caucasian female here for annual breast and pelvic exam.    Has some breast pain bilaterally that comes and goes.  Doing self breast checks.   Has been on disability, which will now be ending per patient.   PCP:  Anastasia Pall, MD   Patient's last menstrual period was 02/03/2013 (approximate).           Sexually active: No. rarely The current method of family planning is post menopausal status.    Exercising: No.  The patient does not participate in regular exercise at present. Smoker:  no  Health Maintenance: Pap:  06-02-20 Neg:Neg HR HPV, 04-07-17 Neg:Neg HR HPV, 02-21-14 Neg:Neg HR HPV History of abnormal Pap:  no MMG:  08-11-20 Neg/BiRads1--knows to schedule Colonoscopy:  06/2020--Microscopic lymphocytic colotis BMD:  10-24-20  Result :Osteopenia in hip and forearm.  Low risk for fracture by FRAX model. TDaP:  PCP Gardasil:   no HIV: Unsure Hep C: 2016 Neg Screening Labs: PCP   reports that she has never smoked. She has never used smokeless tobacco. She reports that she does not currently use alcohol. She reports that she does not use drugs.  Past Medical History:  Diagnosis Date   ALLERGIC RHINITIS 03/02/2007   BREAST CYST, LEFT 11/23/2009   CAD S/P percutaneous coronary angioplasty 01/2011   a) Exertional Back Pain & High RISK ST) - 95% Ostial LAD --> PCI with Promus DES 3.0 mm x 12 mm; b) LHC 08/2012: LAD stent Patent , 50% mid LAD; c) Neg Myoview 03/2012 d). LHC 12/09/2013 40-50% mid-LAD no progression, medical therapy. e) Neg nuc 2016, 2018.   Colitis 06/06/2020   microscopic lymphocytic colitis   HLD (hyperlipidemia)    Incomplete left bundle branch block    REACTIVE AIRWAY DISEASE 10/21/2008    Past Surgical History:  Procedure Laterality Date   AUGMENTATION MAMMAPLASTY Bilateral 1990   BREAST BIOPSY     BREAST SURGERY     BILAT IMPLANTS   LEFT HEART CATHETERIZATION WITH CORONARY ANGIOGRAM N/A 09/10/2012   Procedure: LEFT HEART  CATHETERIZATION WITH CORONARY ANGIOGRAM;  Surgeon: Sherren Mocha, MD;  Location: [facility] CATH LAB;  Service: Cardiovascular;  Laterality: N/A;   LEFT HEART CATHETERIZATION WITH CORONARY ANGIOGRAM N/A 12/09/2013   Procedure: LEFT HEART CATHETERIZATION WITH CORONARY ANGIOGRAM;  Surgeon: Leonie Man, MD;  Location: [facility] CATH LAB;  Service: Cardiovascular;  Laterality: N/A;   PERCUTANEOUS CORONARY STENT INTERVENTION (PCI-S)  01/2011   Promus Premier DES 3.0 mm x12 mm Ostial LAD (extends into LM with partial "jailing" of Cx)    Current Outpatient Medications  Medication Sig Dispense Refill   aspirin EC 81 MG tablet Take 81 mg by mouth every evening.     Coenzyme Q10 (CO Q 10 PO) Take by mouth.     colestipol (COLESTID) 1 g tablet Take 2 g by mouth 2 (two) times daily.     ergocalciferol (VITAMIN D2) 1.25 MG (50000 UT) capsule TAKE 1 CAPSULE ONCE WEEKLY     ibuprofen (ADVIL,MOTRIN) 200 MG tablet Take 200-400 mg by mouth every 6 (six) hours as needed for pain or headache.     Multiple Vitamins-Minerals (MULTI-VITAMIN GUMMIES PO) Take by mouth.     nitroGLYCERIN (NITROSTAT) 0.4 MG SL tablet Place 1 tablet (0.4 mg total) under the tongue every 5 (five) minutes as needed for chest pain. 25 tablet 3   pravastatin (PRAVACHOL) 40 MG tablet Take by mouth.     sertraline (ZOLOFT) 50 MG tablet Take 1  tablet (50 mg total) by mouth daily. 30 tablet 1   azaTHIOprine (IMURAN) 50 MG tablet Take 50 mg by mouth daily. (Patient not taking: Reported on 12/20/2021)     ezetimibe (ZETIA) 10 MG tablet Take 1 tablet (10 mg total) by mouth daily. 90 tablet 3   No current facility-administered medications for this visit.    Family History  Problem Relation Age of Onset   Alcohol abuse Other    Diabetes Other    Hypertension Other    Heart disease Other     Review of Systems  Genitourinary:        Rt.br.pain  All other systems reviewed and are negative.   Exam:   BP (!) 140/82   Pulse 76   Resp 16   Ht 5' 1.5"  (1.562 m)   Wt 97 lb 12.8 oz (44.4 kg)   LMP 02/03/2013 (Approximate)   BMI 18.18 kg/m     General appearance: alert, cooperative and appears stated age Head: normocephalic, without obvious abnormality, atraumatic Neck: no adenopathy, supple, symmetrical, trachea midline and thyroid normal to inspection and palpation Lungs: clear to auscultation bilaterally Breasts: bilateral implants no dominant masses or tenderness, No nipple retraction or dimpling, No nipple discharge or bleeding, No axillary adenopathy Heart: regular rate and rhythm Abdomen: soft, non-tender; no masses, no organomegaly Extremities: extremities normal, atraumatic, no cyanosis or edema Skin: skin color, texture, turgor normal. No rashes or lesions Lymph nodes: cervical, supraclavicular, and axillary nodes normal. Neurologic: grossly normal  Pelvic: External genitalia:  no lesions              No abnormal inguinal nodes palpated.              Urethra:  normal appearing urethra with no masses, tenderness or lesions              Bartholins and Skenes: normal                 Vagina: normal appearing vagina with normal color and discharge, no lesions              Cervix: no lesions              Pap taken:  no Bimanual Exam:  Uterus:  normal size, contour, position, consistency, mobility, non-tender              Adnexa: no mass, fullness, tenderness              Rectal exam: yes.  Confirms.              Anus:  normal sphincter tone, no lesions  Chaperone was present for exam:  Estill Bamberg, CMA  Assessment:   Well woman visit with gynecologic exam. Bilateral breast implants. Anxiety and stress.  Microscopic colitis with significant weight loss.  Hx CAD.   Plan: Mammogram screening discussed.  She will schedule a screening mammogram. Self breast awareness reviewed. Pap and HR HPV  in 2 years.  Guidelines for Calcium, Vitamin D, regular exercise program including cardiovascular and weight bearing exercise. BMD report  reviewed including FRAX model.  BMD in 2 years.  Next breast and pelvic exam in 2 years and follow up prn.  After visit summary provided.   19 min  total time was spent for this patient encounter, including preparation, face-to-face counseling with the patient, coordination of care, and documentation of the encounter.

## 2021-12-20 ENCOUNTER — Encounter: Payer: Self-pay | Admitting: Obstetrics and Gynecology

## 2021-12-20 ENCOUNTER — Ambulatory Visit (INDEPENDENT_AMBULATORY_CARE_PROVIDER_SITE_OTHER): Payer: Medicare Other | Admitting: Obstetrics and Gynecology

## 2021-12-20 VITALS — BP 140/82 | HR 76 | Resp 16 | Ht 61.5 in | Wt 97.8 lb

## 2021-12-20 DIAGNOSIS — I6523 Occlusion and stenosis of bilateral carotid arteries: Secondary | ICD-10-CM

## 2021-12-20 DIAGNOSIS — M8589 Other specified disorders of bone density and structure, multiple sites: Secondary | ICD-10-CM | POA: Diagnosis not present

## 2021-12-20 DIAGNOSIS — Z01419 Encounter for gynecological examination (general) (routine) without abnormal findings: Secondary | ICD-10-CM

## 2021-12-20 NOTE — Patient Instructions (Signed)

## 2021-12-28 ENCOUNTER — Ambulatory Visit (HOSPITAL_COMMUNITY): Payer: Medicare Other

## 2022-01-02 NOTE — Progress Notes (Signed)
HPI: FU coronary disease. Previously seen for chest pain. Stress echo was markedly abnormal with nonsustained ventricular tachycardia and ischemia in the anteroseptal wall and apex. Patient underwent cardiac catheterization in September of 2012 which revealed a 95% ostial LAD lesion. The patient had PCI with a drug-eluting stent. There was some jailing of the circumflex. Repeat cath at [facility] in Dec 2012 because of palpitations and chest pain revealed patency of LAD stent and she had no other significant CAD. Carotid Dopplers December 2012 showed 0-39% bilateral stenosis. 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 Patient has continued to have chest pain. She had repeat catheterization in May of 2014 which revealed normal left main, patent LAD stent, 50% mid LAD. There was a 30-40% ostial circumflex. Ejection fraction was 55%. Again had repeat cath 8/15 that showed no change (nonobstructive CAD). Monitor 8/18 showed sinus with pacs, brief PAT and 6 beats NSVT.  PET scan December 04, 2021 showed ejection fraction 56% and no ischemia or infarction.  Follow-up echocardiogram September 2023 showed normal LV function, grade 1 diastolic dysfunction..  Since last seen she has dyspnea with more vigorous activities but not routine activities.  No orthopnea, PND, pedal edema, exertional chest pain or syncope.  Her microscopic lymphocytic colitis is being treated and there has been some improvement with her diarrhea.  Current Outpatient Medications  Medication Sig Dispense Refill   aspirin EC 81 MG tablet Take 81 mg by mouth every evening.     azaTHIOprine (IMURAN) 50 MG tablet Take 50 mg by mouth daily.     Coenzyme Q10 (CO Q 10 PO) Take by mouth.     colestipol (COLESTID) 1 g tablet Take 2 g by mouth 2 (two) times daily.     ergocalciferol (VITAMIN D2) 1.25 MG (50000 UT) capsule TAKE 1 CAPSULE ONCE WEEKLY     ibuprofen (ADVIL,MOTRIN) 200 MG tablet Take 200-400  mg by mouth every 6 (six) hours as needed for pain or headache.     Multiple Vitamins-Minerals (MULTI-VITAMIN GUMMIES PO) Take by mouth.     nitroGLYCERIN (NITROSTAT) 0.4 MG SL tablet Place 1 tablet (0.4 mg total) under the tongue every 5 (five) minutes as needed for chest pain. 25 tablet 3   pravastatin (PRAVACHOL) 40 MG tablet Take by mouth.     sertraline (ZOLOFT) 50 MG tablet Take 1 tablet (50 mg total) by mouth daily. 30 tablet 1   ezetimibe (ZETIA) 10 MG tablet Take 1 tablet (10 mg total) by mouth daily. 90 tablet 3   No current facility-administered medications for this visit.     Past Medical History:  Diagnosis Date   ALLERGIC RHINITIS 03/02/2007   BREAST CYST, LEFT 11/23/2009   CAD S/P percutaneous coronary angioplasty 01/2011   a) Exertional Back Pain & High RISK ST) - 95% Ostial LAD --> PCI with Promus DES 3.0 mm x 12 mm; b) LHC 08/2012: LAD stent Patent , 50% mid LAD; c) Neg Myoview 03/2012 d). LHC 12/09/2013 40-50% mid-LAD no progression, medical therapy. e) Neg nuc 2016, 2018.   Colitis 06/06/2020   microscopic lymphocytic colitis   HLD (hyperlipidemia)    Incomplete left bundle branch block    REACTIVE AIRWAY DISEASE 10/21/2008    Past Surgical History:  Procedure Laterality Date   AUGMENTATION MAMMAPLASTY Bilateral 1990   BREAST BIOPSY     BREAST SURGERY     BILAT IMPLANTS   LEFT HEART CATHETERIZATION WITH CORONARY ANGIOGRAM N/A  09/10/2012   Procedure: LEFT HEART CATHETERIZATION WITH CORONARY ANGIOGRAM;  Surgeon: Sherren Mocha, MD;  Location: [facility] CATH LAB;  Service: Cardiovascular;  Laterality: N/A;   LEFT HEART CATHETERIZATION WITH CORONARY ANGIOGRAM N/A 12/09/2013   Procedure: LEFT HEART CATHETERIZATION WITH CORONARY ANGIOGRAM;  Surgeon: Leonie Man, MD;  Location[facility] C[facility] LAB;  Service: Cardiovascular;  Laterality: N/A;   PERCUTANEOUS CORONARY STENT INTERVENTION (PCI-S)  01/2011   Promus Premier DES 3.0 mm x12 mm Ostial LAD (extends into LM with partial "jailing" of  Cx)    Social History   Socioeconomic History   Marital status: Married    Spouse name: Not on file   Number of children: Not on file   Years of education: Not on file   Highest education level: Not on file  Occupational History    Employer: [employer]    Comment: Education officer, environmental  Tobacco Use   Smoking status: Never   Smokeless tobacco: Never  Vaping Use   Vaping Use: Never used  Substance and Sexual Activity   Alcohol use: Not Currently   Drug use: No   Sexual activity: Not Currently    Partners: Male    Birth control/protection: Post-menopausal    Comment: first intercourse >16, less than 5 partners  Other Topics Concern   Not on file  Social History Narrative   Occupation:    Never Smoked   Alcohol use- no   Married   Drug use- no   Regular Exercise- yes    Social Determinants of Radio broadcast assistant Strain: Not on file  Food Insecurity: Not on file  Transportation Needs: Not on file  Physical Activity: Not on file  Stress: Not on file  Social Connections: Not on file  Intimate Partner Violence: Not on file    Family History  Problem Relation Age of Onset   Alcohol abuse Other    Diabetes Other    Hypertension Other    Heart disease Other     ROS: Diarrhea and generalized weakness but no fevers or chills, productive cough, hemoptysis, dysphasia, odynophagia, melena, hematochezia, dysuria, hematuria, rash, seizure activity, orthopnea, PND, pedal edema, claudication. Remaining systems are negative.  Physical Exam: Well-developed thin in no acute distress.  Skin is warm and dry.  HEENT is normal.  Neck is supple.  Chest is clear to auscultation with normal expansion.  Cardiovascular exam is regular rate and rhythm.  Abdominal exam nontender or distended. No masses palpated. Extremities show no edema. neuro grossly intact  A/P  1 chest pain-patient has had no recurrent symptoms.  Functional study negative.  Echocardiogram  showed normal LV function.  Continue medical therapy.  2 coronary artery disease-continue aspirin and statin.  3 hypertension-patient's blood pressure is controlled on no medications at present.  We will follow.  4 hyperlipidemia-patient had significant elevation in liver functions with Crestor 40 mg daily.  Continue pravastatin and Zetia.  Most recent LDL 125.  Will refer to lipid clinic for consideration of Repatha, Praluent, Nexletol or inclisiran.  4 palpitations-she has had no recurrent symptoms.  Beta-blocker was discontinued previously due to fatigue.  Kirk Ruths, MD

## 2022-01-03 ENCOUNTER — Ambulatory Visit (HOSPITAL_COMMUNITY): Payer: Medicare Other | Attending: Internal Medicine

## 2022-01-03 DIAGNOSIS — I2510 Atherosclerotic heart disease of native coronary artery without angina pectoris: Secondary | ICD-10-CM | POA: Diagnosis present

## 2022-01-03 DIAGNOSIS — R0609 Other forms of dyspnea: Secondary | ICD-10-CM | POA: Insufficient documentation

## 2022-01-03 LAB — ECHOCARDIOGRAM COMPLETE
Area-P 1/2: 3.99 cm2
S' Lateral: 2.6 cm

## 2022-01-08 ENCOUNTER — Telehealth: Payer: Self-pay

## 2022-01-08 NOTE — Telephone Encounter (Signed)
LMOM  to discuss results.

## 2022-01-08 NOTE — Telephone Encounter (Signed)
Lmom to discuss echo results. Waiting on a return call. 

## 2022-01-09 ENCOUNTER — Telehealth: Payer: Self-pay | Admitting: Cardiology

## 2022-01-09 NOTE — Telephone Encounter (Signed)
Pt returning nurses call regarding ECHO results. Please advise

## 2022-01-09 NOTE — Telephone Encounter (Signed)
Patient given result of echo per E. Monge: Echocardiogram shows normal heart pumping function, mildly impaired relaxation of the left bottom chamber of the heart (this is a very common finding), no significant valvular abnormalities.  This is good news. Continue current medications and follow-up with Dr. Stanford Breed as scheduled.  Patient had not questions or concerns at this time.

## 2022-01-15 ENCOUNTER — Ambulatory Visit: Payer: Medicare Other | Attending: Cardiology | Admitting: Cardiology

## 2022-01-15 ENCOUNTER — Encounter: Payer: Self-pay | Admitting: Cardiology

## 2022-01-15 VITALS — BP 124/68 | HR 64 | Ht 63.0 in | Wt 99.2 lb

## 2022-01-15 DIAGNOSIS — R072 Precordial pain: Secondary | ICD-10-CM | POA: Diagnosis not present

## 2022-01-15 DIAGNOSIS — I2510 Atherosclerotic heart disease of native coronary artery without angina pectoris: Secondary | ICD-10-CM | POA: Insufficient documentation

## 2022-01-15 DIAGNOSIS — I6523 Occlusion and stenosis of bilateral carotid arteries: Secondary | ICD-10-CM

## 2022-01-15 DIAGNOSIS — I10 Essential (primary) hypertension: Secondary | ICD-10-CM | POA: Insufficient documentation

## 2022-01-15 DIAGNOSIS — E785 Hyperlipidemia, unspecified: Secondary | ICD-10-CM | POA: Diagnosis not present

## 2022-01-15 NOTE — Patient Instructions (Signed)
  Follow-Up: At  [facility], you and your health needs are our priority.  As part of our continuing mission to provide you with exceptional heart care, we have created designated Provider Care Teams.  These Care Teams include your primary Cardiologist (physician) and Advanced Practice Providers (APPs -  Physician Assistants and Nurse Practitioners) who all work together to provide you with the care you need, when you need it.  We recommend signing up for the patient portal called "MyChart".  Sign up information is provided on this After Visit Summary.  MyChart is used to connect with patients for Virtual Visits (Telemedicine).  Patients are able to view lab/test results, encounter notes, upcoming appointments, etc.  Non-urgent messages can be sent to your provider as well.   To learn more about what you can do with MyChart, go to [email or web address].    Your next appointment:   6 month(s)  The format for your next appointment:   In Person  Provider:   Brian Crenshaw, MD    

## 2022-02-01 ENCOUNTER — Encounter: Payer: Self-pay | Admitting: Obstetrics and Gynecology

## 2022-02-04 NOTE — Telephone Encounter (Signed)
Please contact her breast imaging center that I am ok with her doing a screening mammogram.  Her breast pain is not persistent.  Her exam was normal.

## 2022-02-07 ENCOUNTER — Ambulatory Visit: Payer: Medicare Other

## 2022-02-14 DIAGNOSIS — Z1231 Encounter for screening mammogram for malignant neoplasm of breast: Secondary | ICD-10-CM

## 2022-02-20 ENCOUNTER — Ambulatory Visit: Payer: Medicare Other

## 2022-02-28 ENCOUNTER — Ambulatory Visit (INDEPENDENT_AMBULATORY_CARE_PROVIDER_SITE_OTHER): Payer: Medicare Other

## 2022-02-28 ENCOUNTER — Other Ambulatory Visit: Payer: Self-pay | Admitting: Obstetrics and Gynecology

## 2022-02-28 DIAGNOSIS — Z1231 Encounter for screening mammogram for malignant neoplasm of breast: Secondary | ICD-10-CM

## 2022-03-04 ENCOUNTER — Encounter: Payer: Self-pay | Admitting: Obstetrics and Gynecology

## 2022-03-21 ENCOUNTER — Ambulatory Visit: Payer: Medicare Other

## 2022-04-30 ENCOUNTER — Ambulatory Visit: Payer: Medicare Other

## 2022-05-22 ENCOUNTER — Ambulatory Visit: Payer: Medicare Other

## 2022-05-27 ENCOUNTER — Ambulatory Visit: Payer: Medicare Other

## 2022-06-07 ENCOUNTER — Encounter: Payer: Self-pay | Admitting: Cardiology

## 2022-06-07 ENCOUNTER — Other Ambulatory Visit: Payer: Self-pay | Admitting: Cardiology

## 2022-06-07 DIAGNOSIS — Z131 Encounter for screening for diabetes mellitus: Secondary | ICD-10-CM

## 2022-06-07 DIAGNOSIS — Z13220 Encounter for screening for lipoid disorders: Secondary | ICD-10-CM

## 2022-06-07 NOTE — Progress Notes (Signed)
Discussed the BP values. Client stated she has a visit with her Cardiologist next week and is evaluated regularly with her cardiac history.

## 2022-06-07 NOTE — Addendum Note (Signed)
Addended by: Royston Bake on: 06/07/2022 01:03 PM   Modules accepted: Orders

## 2022-06-08 LAB — LIPID PANEL W/O CHOL/HDL RATIO
Cholesterol, Total: 220 mg/dL — ABNORMAL HIGH (ref 100–199)
HDL: 133 mg/dL (ref >39)
LDL Chol Calc (NIH): 74 mg/dL (ref 0–99)
Triglycerides: 73 mg/dL (ref 0–149)
VLDL Cholesterol Cal: 13 mg/dL (ref 5–40)

## 2022-06-08 LAB — HGB A1C W/O EAG: Hgb A1c MFr Bld: 4.9 % (ref 4.8–5.6)

## 2022-06-13 ENCOUNTER — Telehealth: Payer: Self-pay

## 2022-06-13 NOTE — Telephone Encounter (Signed)
Called patient via to give lab results from free screening.  Total Cholesterol: 220 Triglycerides: 73 HDL Cholesterol: 133 LDL Cholesterol: 74 Hemoglobin A1C: 4.9  Called patient to give lab results from the [facility]. LDL still elevated but has improved from previous labs done in 12/2021. Patient still taking prescribed cholesterol lowering medications. Patient is scheduled for FU with cardiology on 06/27/22. Patient voiced understanding.

## 2022-06-17 ENCOUNTER — Encounter: Payer: Self-pay | Admitting: *Deleted

## 2022-06-17 NOTE — Progress Notes (Signed)
Pt has established PCP and cardiologist- 06/07/22 event lab results discussed with pt by Jonna Clark on 06/13/22 and pt has appt with PCP on 3/62/4 and r/s cardiology appt on 07/16/22 to review results and event b/p of 146/93. No SDOH barriers noted during event review. No further health equity team support indicated at this time.

## 2022-06-18 ENCOUNTER — Ambulatory Visit: Payer: Medicare Other

## 2022-06-27 ENCOUNTER — Ambulatory Visit: Payer: Medicare Other

## 2022-07-08 NOTE — Progress Notes (Deleted)
HPI: FU coronary disease. Previously seen for chest pain. Stress echo was markedly abnormal with nonsustained ventricular tachycardia and ischemia in the anteroseptal wall and apex. Patient underwent cardiac catheterization in September of 2012 which revealed a 95% ostial LAD lesion. The patient had PCI with a drug-eluting stent. There was some jailing of the circumflex. Repeat cath at [facility] in Dec 2012 because of palpitations and chest pain revealed patency of LAD stent and she had no other significant CAD. Carotid Dopplers December 2012 showed 0-39% bilateral stenosis. 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 Patient has continued to have chest pain. She had repeat catheterization in May of 2014 which revealed normal left main, patent LAD stent, 50% mid LAD. There was a 30-40% ostial circumflex. Ejection fraction was 55%. Again had repeat cath 8/15 that showed no change (nonobstructive CAD). Monitor 8/18 showed sinus with pacs, brief PAT and 6 beats NSVT.  PET scan December 04, 2021 showed ejection fraction 56% and no ischemia or infarction.  Follow-up echocardiogram September 2023 showed normal LV function, grade 1 diastolic dysfunction..  Since last seen   Current Outpatient Medications  Medication Sig Dispense Refill   aspirin EC 81 MG tablet Take 81 mg by mouth every evening.     azaTHIOprine (IMURAN) 50 MG tablet Take 50 mg by mouth daily.     Coenzyme Q10 (CO Q 10 PO) Take by mouth.     colestipol (COLESTID) 1 g tablet Take 2 g by mouth 2 (two) times daily.     ergocalciferol (VITAMIN D2) 1.25 MG (50000 UT) capsule TAKE 1 CAPSULE ONCE WEEKLY     ezetimibe (ZETIA) 10 MG tablet Take 1 tablet (10 mg total) by mouth daily. 90 tablet 3   ibuprofen (ADVIL,MOTRIN) 200 MG tablet Take 200-400 mg by mouth every 6 (six) hours as needed for pain or headache.     Multiple Vitamins-Minerals (MULTI-VITAMIN GUMMIES PO) Take by mouth.     nitroGLYCERIN  (NITROSTAT) 0.4 MG SL tablet Place 1 tablet (0.4 mg total) under the tongue every 5 (five) minutes as needed for chest pain. 25 tablet 3   pravastatin (PRAVACHOL) 40 MG tablet Take by mouth.     sertraline (ZOLOFT) 50 MG tablet Take 1 tablet (50 mg total) by mouth daily. 30 tablet 1   No current facility-administered medications for this visit.     Past Medical History:  Diagnosis Date   ALLERGIC RHINITIS 03/02/2007   BREAST CYST, LEFT 11/23/2009   CAD S/P percutaneous coronary angioplasty 01/2011   a) Exertional Back Pain & High RISK ST) - 95% Ostial LAD --> PCI with Promus DES 3.0 mm x 12 mm; b) LHC 08/2012: LAD stent Patent , 50% mid LAD; c) Neg Myoview 03/2012 d). LHC 12/09/2013 40-50% mid-LAD no progression, medical therapy. e) Neg nuc 2016, 2018.   Colitis 06/06/2020   microscopic lymphocytic colitis   HLD (hyperlipidemia)    Incomplete left bundle branch block    REACTIVE AIRWAY DISEASE 10/21/2008    Past Surgical History:  Procedure Laterality Date   AUGMENTATION MAMMAPLASTY Bilateral 1990   BREAST BIOPSY     BREAST SURGERY     BILAT IMPLANTS   LEFT HEART CATHETERIZATION WITH CORONARY ANGIOGRAM N/A 09/10/2012   Procedure: LEFT HEART CATHETERIZATION WITH CORONARY ANGIOGRAM;  Surgeon: Sherren Mocha, MD;  Location: [facility] CATH LAB;  Service: Cardiovascular;  Laterality: N/A;   LEFT HEART CATHETERIZATION WITH CORONARY ANGIOGRAM N/A 12/09/2013  Procedure: LEFT HEART CATHETERIZATION WITH CORONARY ANGIOGRAM;  Surgeon: Leonie Man, MD;  Location: [facility] CATH LAB;  Service: Cardiovascular;  Laterality: N/A;   PERCUTANEOUS CORONARY STENT INTERVENTION (PCI-S)  01/2011   Promus Premier DES 3.0 mm x12 mm Ostial LAD (extends into LM with partial "jailing" of Cx)    Social History   Socioeconomic History   Marital status: Married    Spouse name: Not on file   Number of children: Not on file   Years of education: Not on file   Highest education level: Not on file  Occupational History     Employer: [employer]    Comment: Education officer, environmental  Tobacco Use   Smoking status: Never   Smokeless tobacco: Never  Vaping Use   Vaping Use: Never used  Substance and Sexual Activity   Alcohol use: Not Currently   Drug use: No   Sexual activity: Not Currently    Partners: Male    Birth control/protection: Post-menopausal    Comment: first intercourse >16, less than 5 partners  Other Topics Concern   Not on file  Social History Narrative   Occupation:    Never Smoked   Alcohol use- no   Married   Drug use- no   Regular Exercise- yes    Social Determinants of Radio broadcast assistant Strain: Not on file  Food Insecurity: No Food Insecurity (06/07/2022)   Hunger Vital Sign    Worried About Running Out of Food in the Last Year: Never true    [place] in the Last Year: Never true  Transportation Needs: No Transportation Needs (06/07/2022)   PRAPARE - Hydrologist (Medical): No    Lack of Transportation (Non-Medical): No  Physical Activity: Not on file  Stress: Not on file  Social Connections: Unknown (06/07/2022)   Social Connection and Isolation Panel [NHANES]    Frequency of Communication with Friends and Family: Never    Frequency of Social Gatherings with Friends and Family: Never    Attends Religious Services: Never    Marine scientist or Organizations: Not on file    Attends Archivist Meetings: Not on file    Marital Status: Not on file  Intimate Partner Violence: Not At Risk (06/07/2022)   Humiliation, Afraid, Rape, and Kick questionnaire    Fear of Current or Ex-Partner: No    Emotionally Abused: No    Physically Abused: No    Sexually Abused: No    Family History  Problem Relation Age of Onset   Alcohol abuse Other    Diabetes Other    Hypertension Other    Heart disease Other     ROS: no fevers or chills, productive cough, hemoptysis, dysphasia, odynophagia, melena, hematochezia, dysuria,  hematuria, rash, seizure activity, orthopnea, PND, pedal edema, claudication. Remaining systems are negative.  Physical Exam: Well-developed well-nourished in no acute distress.  Skin is warm and dry.  HEENT is normal.  Neck is supple.  Chest is clear to auscultation with normal expansion.  Cardiovascular exam is regular rate and rhythm.  Abdominal exam nontender or distended. No masses palpated. Extremities show no edema. neuro grossly intact  ECG- personally reviewed  A/P  1 chest pain-symptoms are longstanding.  Most recent functional study negative.  Plan to continue medical therapy.  2 coronary artery disease-continue aspirin and statin.  3 hyperlipidemia-previous significant elevation in liver functions with Crestor.  Continue pravastatin and Zetia.  4 hypertension-blood  pressure controlled on no medications.  Likely improved following significant weight loss.  5 history of palpitations-no recurrences and beta-blocker was discontinued previously due to fatigue.  Kirk Ruths, MD

## 2022-07-12 ENCOUNTER — Ambulatory Visit: Payer: Medicare Other

## 2022-07-16 ENCOUNTER — Ambulatory Visit: Payer: Medicare Other | Admitting: Cardiology

## 2022-07-31 ENCOUNTER — Ambulatory Visit: Payer: Medicare Other

## 2022-08-05 NOTE — Progress Notes (Signed)
HPI: FU coronary disease. Previously seen for chest pain. Stress echo was markedly abnormal with nonsustained ventricular tachycardia and ischemia in the anteroseptal wall and apex. Patient underwent cardiac catheterization in September of 2012 which revealed a 95% ostial LAD lesion. The patient had PCI with a drug-eluting stent. There was some jailing of the circumflex. Repeat cath at [facility] in Dec 2012 because of palpitations and chest pain revealed patency of LAD stent and she had no other significant CAD. Carotid Dopplers December 2012 showed 0-39% bilateral stenosis. 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 Patient has continued to have chest pain. She had repeat catheterization in May of 2014 which revealed normal left main, patent LAD stent, 50% mid LAD. There was a 30-40% ostial circumflex. Ejection fraction was 55%. Again had repeat cath 8/15 that showed no change (nonobstructive CAD). Monitor 8/18 showed sinus with pacs, brief PAT and 6 beats NSVT.  Follow-up echocardiogram August 2023 showed normal LV function, grade 1 diastolic dysfunction.Marland Kitchen  PET scan August 2023 showed normal perfusion and ejection fraction 56%.  Since last seen she has occasional vague nonspecific chest pain not related to activities.  This is unchanged in severity or frequency.  She denies exertional chest pain.  Occasional dyspnea with more vigorous activities.  No orthopnea, PND or pedal edema.  She has not had syncope.  Current Outpatient Medications  Medication Sig Dispense Refill   aspirin EC 81 MG tablet Take 81 mg by mouth every evening.     azaTHIOprine (IMURAN) 50 MG tablet Take 50 mg by mouth daily.     Coenzyme Q10 (CO Q 10 PO) Take by mouth.     colestipol (COLESTID) 1 g tablet Take 2 g by mouth 2 (two) times daily.     ergocalciferol (VITAMIN D2) 1.25 MG (50000 UT) capsule TAKE 1 CAPSULE ONCE WEEKLY     ezetimibe (ZETIA) 10 MG tablet Take 1 tablet (10  mg total) by mouth daily. 90 tablet 3   ibuprofen (ADVIL,MOTRIN) 200 MG tablet Take 200-400 mg by mouth every 6 (six) hours as needed for pain or headache.     Multiple Vitamins-Minerals (MULTI-VITAMIN GUMMIES PO) Take by mouth.     nitroGLYCERIN (NITROSTAT) 0.4 MG SL tablet Place 1 tablet (0.4 mg total) under the tongue every 5 (five) minutes as needed for chest pain. 25 tablet 3   pravastatin (PRAVACHOL) 40 MG tablet Take by mouth.     sertraline (ZOLOFT) 50 MG tablet Take 1 tablet (50 mg total) by mouth daily. 30 tablet 1   No current facility-administered medications for this visit.     Past Medical History:  Diagnosis Date   ALLERGIC RHINITIS 03/02/2007   BREAST CYST, LEFT 11/23/2009   CAD S/P percutaneous coronary angioplasty 01/2011   a) Exertional Back Pain & High RISK ST) - 95% Ostial LAD --> PCI with Promus DES 3.0 mm x 12 mm; b) LHC 08/2012: LAD stent Patent , 50% mid LAD; c) Neg Myoview 03/2012 d). LHC 12/09/2013 40-50% mid-LAD no progression, medical therapy. e) Neg nuc 2016, 2018.   Colitis 06/06/2020   microscopic lymphocytic colitis   HLD (hyperlipidemia)    Incomplete left bundle branch block    REACTIVE AIRWAY DISEASE 10/21/2008    Past Surgical History:  Procedure Laterality Date   AUGMENTATION MAMMAPLASTY Bilateral 1990   BREAST BIOPSY     BREAST SURGERY     BILAT IMPLANTS   LEFT HEART CATHETERIZATION WITH  CORONARY ANGIOGRAM N/A 09/10/2012   Procedure: LEFT HEART CATHETERIZATION WITH CORONARY ANGIOGRAM;  Surgeon: Tonny BollmanMichael Cooper, MD;  [facility];  Service: Cardiovascular;  Laterality: N/A;   LEFT HEART CATHETERIZATION WITH CORONARY ANGIOGRAM N/A 12/09/2013   Procedure: LEFT HEART CATHETERIZATION WITH CORONARY ANGIOGRAM;  Surgeon: Marykay Lexavid W Harding, MD;  [facility];  Service: Cardiovascular;  Laterality: N/A;   PERCUTANEOUS CORONARY STENT INTERVENTION (PCI-S)  01/2011   Promus Premier DES 3.0 mm x12 mm Ostial LAD (extends into LM with partial "jailing"  of Cx)    Social History   Socioeconomic History   Marital status: Married    Spouse name: Not on file   Number of children: Not on file   Years of education: Not on file   Highest education level: Not on file  Occupational History    Employer: [employer]    Comment: Industrial/product designerHealth systems manager  Tobacco Use   Smoking status: Never   Smokeless tobacco: Never  Vaping Use   Vaping Use: Never used  Substance and Sexual Activity   Alcohol use: Not Currently   Drug use: No   Sexual activity: Not Currently    Partners: Male    Birth control/protection: Post-menopausal    Comment: first intercourse >16, less than 5 partners  Other Topics Concern   Not on file  Social History Narrative   Occupation:    Never Smoked   Alcohol use- no   Married   Drug use- no   Regular Exercise- yes    Social Determinants of Corporate investment bankerHealth   Financial Resource Strain: Not on file  Food Insecurity: No Food Insecurity (06/07/2022)   Hunger Vital Sign    Worried About Running Out of Food in the Last Year: Never true    Ran Out of Food in the Last Year: Never true  Transportation Needs: No Transportation Needs (06/07/2022)   PRAPARE - Administrator, Civil ServiceTransportation    Lack of Transportation (Medical): No    Lack of Transportation (Non-Medical): No  Physical Activity: Not on file  Stress: Not on file  Social Connections: Unknown (06/07/2022)   Social Connection and Isolation Panel [NHANES]    Frequency of Communication with Friends and Family: Never    Frequency of Social Gatherings with Friends and Family: Never    Attends Religious Services: Never    Database administratorActive Member of Clubs or Organizations: Not on file    Attends BankerClub or Organization Meetings: Not on file    Marital Status: Not on file  Intimate Partner Violence: Not At Risk (06/07/2022)   Humiliation, Afraid, Rape, and Kick questionnaire    Fear of Current or Ex-Partner: No    Emotionally Abused: No    Physically Abused: No    Sexually Abused: No    Family  History  Problem Relation Age of Onset   Alcohol abuse Other    Diabetes Other    Hypertension Other    Heart disease Other     ROS: Diarrhea and balance issues but no fevers or chills, productive cough, hemoptysis, dysphasia, odynophagia, melena, hematochezia, dysuria, hematuria, rash, seizure activity, orthopnea, PND, pedal edema, claudication. Remaining systems are negative.  Physical Exam: Well-developed well-nourished in no acute distress.  Skin is warm and dry.  HEENT is normal.  Neck is supple.  Chest is clear to auscultation with normal expansion.  Cardiovascular exam is regular rate and rhythm.  Abdominal exam nontender or distended. No masses palpated. Extremities show no edema. neuro grossly intact  A/P  1  chest pain-symptoms are longstanding.  Most recent functional study negative.  Plan to continue medical therapy.  2 coronary artery disease-continue aspirin and statin.  3 hyperlipidemia-previous significant elevation in liver functions with Crestor.  Continue pravastatin and Zetia.  Most recent LDL 74 but HDL 133.  4 hypertension-blood pressure controlled on no medications.  Likely improved following significant weight loss.  5 history of palpitations-no recurrences and beta-blocker was discontinued previously due to fatigue.  Olga MillersBrian Anjelo Pullman, MD

## 2022-08-08 ENCOUNTER — Ambulatory Visit: Payer: Medicare Other

## 2022-08-12 ENCOUNTER — Ambulatory Visit: Payer: Medicare Other | Attending: Cardiology | Admitting: Cardiology

## 2022-08-12 ENCOUNTER — Encounter: Payer: Self-pay | Admitting: Cardiology

## 2022-08-12 VITALS — BP 100/60 | HR 70 | Ht 63.0 in | Wt 99.2 lb

## 2022-08-12 DIAGNOSIS — I10 Essential (primary) hypertension: Secondary | ICD-10-CM | POA: Insufficient documentation

## 2022-08-12 DIAGNOSIS — E785 Hyperlipidemia, unspecified: Secondary | ICD-10-CM | POA: Diagnosis not present

## 2022-08-12 DIAGNOSIS — I2510 Atherosclerotic heart disease of native coronary artery without angina pectoris: Secondary | ICD-10-CM | POA: Insufficient documentation

## 2022-08-12 DIAGNOSIS — R072 Precordial pain: Secondary | ICD-10-CM | POA: Insufficient documentation

## 2022-08-12 NOTE — Patient Instructions (Signed)
    Follow-Up: At [facility], you and your health needs are our priority.  As part of our continuing mission to provide you with exceptional heart care, we have created designated Provider Care Teams.  These Care Teams include your primary Cardiologist (physician) and Advanced Practice Providers (APPs -  Physician Assistants and Nurse Practitioners) who all work together to provide you with the care you need, when you need it.  We recommend signing up for the patient portal called "MyChart".  Sign up information is provided on this After Visit Summary.  MyChart is used to connect with patients for Virtual Visits (Telemedicine).  Patients are able to view lab/test results, encounter notes, upcoming appointments, etc.  Non-urgent messages can be sent to your provider as well.   To learn more about what you can do with MyChart, go to [email or web address].    Your next appointment:   6 month(s)  Provider:   Brian Crenshaw, MD      

## 2022-11-04 ENCOUNTER — Encounter: Payer: Self-pay | Admitting: Obstetrics and Gynecology

## 2022-11-04 NOTE — Telephone Encounter (Signed)
[name], Angelica  Angelica [name] D, CMA Left message for patient to call and schedule appointment. 

## 2022-12-20 NOTE — Progress Notes (Deleted)
67 y.o. G3P0000 Married Caucasian female here for annual exam.    PCP:     Patient's last menstrual period was 02/03/2013 (approximate).           Sexually active: {yes no:314532}  The current method of family planning is post menopausal status.    Exercising: {yes no:314532}  {types:19826} Smoker:  no  Health Maintenance: Pap:  06/02/20 neg: HR HPV neg, 04/07/17 neg: HR HPV neg History of abnormal Pap:  no MMG:  07/13/19 Breast Density Cat C, BI-RADS CAT 2 benign Colonoscopy:  12/14/07 BMD:   10/24/20  Result  osteopenic TDaP:  05/06/10 Gardasil:   no HIV: unsure Hep C: 2016 neg Screening Labs:  Hb today: ***, Urine today: ***   reports that she has never smoked. She has never used smokeless tobacco. She reports that she does not currently use alcohol. She reports that she does not use drugs.  Past Medical History:  Diagnosis Date   ALLERGIC RHINITIS 03/02/2007   BREAST CYST, LEFT 11/23/2009   CAD S/P percutaneous coronary angioplasty 01/2011   a) Exertional Back Pain & High RISK ST) - 95% Ostial LAD --> PCI with Promus DES 3.0 mm x 12 mm; b) LHC 08/2012: LAD stent Patent , 50% mid LAD; c) Neg Myoview 03/2012 d). LHC 12/09/2013 40-50% mid-LAD no progression, medical therapy. e) Neg nuc 2016, 2018.   Colitis 06/06/2020   microscopic lymphocytic colitis   HLD (hyperlipidemia)    Incomplete left bundle branch block    REACTIVE AIRWAY DISEASE 10/21/2008    Past Surgical History:  Procedure Laterality Date   AUGMENTATION MAMMAPLASTY Bilateral 1990   BREAST BIOPSY     BREAST SURGERY     BILAT IMPLANTS   LEFT HEART CATHETERIZATION WITH CORONARY ANGIOGRAM N/A 09/10/2012   Procedure: LEFT HEART CATHETERIZATION WITH CORONARY ANGIOGRAM;  Surgeon: Tonny Bollman, MD;  Location: [facility] CATH LAB;  Service: Cardiovascular;  Laterality: N/A;   LEFT HEART CATHETERIZATION WITH CORONARY ANGIOGRAM N/A 12/09/2013   Procedure: LEFT HEART CATHETERIZATION WITH CORONARY ANGIOGRAM;  Surgeon: Marykay Lex, MD;   Location: [facility] CATH LAB;  Service: Cardiovascular;  Laterality: N/A;   PERCUTANEOUS CORONARY STENT INTERVENTION (PCI-S)  01/2011   Promus Premier DES 3.0 mm x12 mm Ostial LAD (extends into LM with partial "jailing" of Cx)    Current Outpatient Medications  Medication Sig Dispense Refill   aspirin EC 81 MG tablet Take 81 mg by mouth every evening.     Coenzyme Q10 (CO Q 10 PO) Take by mouth.     colestipol (COLESTID) 1 g tablet Take 2 g by mouth 2 (two) times daily.     ergocalciferol (VITAMIN D2) 1.25 MG (50000 UT) capsule TAKE 1 CAPSULE ONCE WEEKLY     ezetimibe (ZETIA) 10 MG tablet Take 1 tablet (10 mg total) by mouth daily. 90 tablet 3   ibuprofen (ADVIL,MOTRIN) 200 MG tablet Take 200-400 mg by mouth every 6 (six) hours as needed for pain or headache.     Multiple Vitamins-Minerals (MULTI-VITAMIN GUMMIES PO) Take by mouth.     nitroGLYCERIN (NITROSTAT) 0.4 MG SL tablet Place 1 tablet (0.4 mg total) under the tongue every 5 (five) minutes as needed for chest pain. 25 tablet 3   pravastatin (PRAVACHOL) 40 MG tablet Take by mouth.     sertraline (ZOLOFT) 50 MG tablet Take 1 tablet (50 mg total) by mouth daily. 30 tablet 1   No current facility-administered medications for this visit.    Family History  Problem Relation Age of Onset   Alcohol abuse Other    Diabetes Other    Hypertension Other    Heart disease Other     Review of Systems  Exam:   LMP 02/03/2013 (Approximate)     General appearance: alert, cooperative and appears stated age Head: normocephalic, without obvious abnormality, atraumatic Neck: no adenopathy, supple, symmetrical, trachea midline and thyroid normal to inspection and palpation Lungs: clear to auscultation bilaterally Breasts: normal appearance, no masses or tenderness, No nipple retraction or dimpling, No nipple discharge or bleeding, No axillary adenopathy Heart: regular rate and rhythm Abdomen: soft, non-tender; no masses, no organomegaly Extremities:  extremities normal, atraumatic, no cyanosis or edema Skin: skin color, texture, turgor normal. No rashes or lesions Lymph nodes: cervical, supraclavicular, and axillary nodes normal. Neurologic: grossly normal  Pelvic: External genitalia:  no lesions              No abnormal inguinal nodes palpated.              Urethra:  normal appearing urethra with no masses, tenderness or lesions              Bartholins and Skenes: normal                 Vagina: normal appearing vagina with normal color and discharge, no lesions              Cervix: no lesions              Pap taken: {yes no:314532} Bimanual Exam:  Uterus:  normal size, contour, position, consistency, mobility, non-tender              Adnexa: no mass, fullness, tenderness              Rectal exam: {yes no:314532}.  Confirms.              Anus:  normal sphincter tone, no lesions  Chaperone was present for exam:  ***  Assessment:   Well woman visit with gynecologic exam.   Plan: Mammogram screening discussed. Self breast awareness reviewed. Pap and HR HPV as above. Guidelines for Calcium, Vitamin D, regular exercise program including cardiovascular and weight bearing exercise.   Follow up annually and prn.   Additional counseling given.  {yes [id number]. _______ minutes face to face time of which over 50% was spent in counseling.    After visit summary provided.

## 2022-12-30 ENCOUNTER — Encounter: Payer: Medicare Other | Admitting: Obstetrics and Gynecology

## 2023-01-14 NOTE — Progress Notes (Signed)
67 y.o. G0P0000 Married Caucasian female here for annual exam.    Still has bilateral breast pain that comes and goes.  This has been occurring for a couple of years.  She has lost almost 50 pounds.   She has bilateral breast implants.  No breast trauma.   Larey Seat and broke her right wrist 8 weeks ago.   She is doing physical therapy due to weight loss and weakness.  PCP:  not currently   Patient's last menstrual period was 02/03/2013 (approximate).           Sexually active: No.  The current method of family planning is post menopausal status.    Exercising: Yes.     She is doing strength and balance and pt  Smoker:  no  Health Maintenance: Pap:  06/02/20 neg: HR HPV neg, 04/07/17 neg: HR HPV neg History of abnormal Pap:  no MMG:  02/28/22 Breast Density Cat D, BI-RADS CAT 1 benign Colonoscopy:  12/14/07 BMD:   10/24/20  Result  osteopenic TDaP:  05/06/10 Gardasil:   no HIV: unsure Hep C: 2016 neg Screening Labs:  Hb today: none , Urine today: none    reports that she has never smoked. She has never used smokeless tobacco. She reports that she does not currently use alcohol. She reports that she does not use drugs.  Past Medical History:  Diagnosis Date   ALLERGIC RHINITIS 03/02/2007   BREAST CYST, LEFT 11/23/2009   CAD S/P percutaneous coronary angioplasty 01/2011   a) Exertional Back Pain & High RISK ST) - 95% Ostial LAD --> PCI with Promus DES 3.0 mm x 12 mm; b) LHC 08/2012: LAD stent Patent , 50% mid LAD; c) Neg Myoview 03/2012 d). LHC 12/09/2013 40-50% mid-LAD no progression, medical therapy. e) Neg nuc 2016, 2018.   Colitis 06/06/2020   microscopic lymphocytic colitis   HLD (hyperlipidemia)    Incomplete left bundle branch block    REACTIVE AIRWAY DISEASE 10/21/2008    Past Surgical History:  Procedure Laterality Date   AUGMENTATION MAMMAPLASTY Bilateral 1990   BREAST BIOPSY     BREAST SURGERY     BILAT IMPLANTS   LEFT HEART CATHETERIZATION WITH CORONARY ANGIOGRAM  N/A 09/10/2012   Procedure: LEFT HEART CATHETERIZATION WITH CORONARY ANGIOGRAM;  Surgeon: Tonny Bollman, MD;  Location: [facility] CATH LAB;  Service: Cardiovascular;  Laterality: N/A;   LEFT HEART CATHETERIZATION WITH CORONARY ANGIOGRAM N/A 12/09/2013   Procedure: LEFT HEART CATHETERIZATION WITH CORONARY ANGIOGRAM;  Surgeon: Marykay Lex, MD;  Location[facility] C[facility];  Service: Cardiovascular;  Laterality: N/A;   PERCUTANEOUS CORONARY STENT INTERVENTION (PCI-S)  01/2011   Promus Premier DES 3.0 mm x12 mm Ostial LAD (extends into LM with partial "jailing" of Cx)    Current Outpatient Medications  Medication Sig Dispense Refill   aspirin EC 81 MG tablet Take 81 mg by mouth every evening.     Coenzyme Q10 (CO Q 10 PO) Take by mouth.     colestipol (COLESTID) 1 g tablet Take 2 g by mouth 2 (two) times daily.     dicyclomine (BENTYL) 10 MG capsule Take 10 mg by mouth 2 (two) times daily.     ergocalciferol (VITAMIN D2) 1.25 MG (50000 UT) capsule TAKE 1 CAPSULE ONCE WEEKLY     ibuprofen (ADVIL,MOTRIN) 200 MG tablet Take 200-400 mg by mouth every 6 (six) hours as needed for pain or headache.     Multiple Vitamins-Minerals (MULTI-VITAMIN GUMMIES PO) Take by mouth.  nitroGLYCERIN (NITROSTAT) 0.4 MG SL tablet Place 1 tablet (0.4 mg total) under the tongue every 5 (five) minutes as needed for chest pain. 25 tablet 3   pravastatin (PRAVACHOL) 40 MG tablet Take by mouth.     sertraline (ZOLOFT) 50 MG tablet Take 1 tablet (50 mg total) by mouth daily. 30 tablet 1   ezetimibe (ZETIA) 10 MG tablet Take 1 tablet (10 mg total) by mouth daily. 90 tablet 3   No current facility-administered medications for this visit.    Family History  Problem Relation Age of Onset   Alcohol abuse Other    Diabetes Other    Hypertension Other    Heart disease Other     Review of Systems  All other systems reviewed and are negative.   Exam:   BP 132/82   Pulse 73   Ht 5' 1.61" (1.565 m)   Wt 101 lb 3.2 oz (45.9 kg)    LMP 02/03/2013 (Approximate)   SpO2 100%   BMI 18.74 kg/m     General appearance: alert, cooperative and appears stated age Head: normocephalic, without obvious abnormality, atraumatic Neck: no adenopathy, supple, symmetrical, trachea midline and thyroid normal to inspection and palpation Lungs: clear to auscultation bilaterally Breasts: bilateral implants, no masses or tenderness, No nipple retraction or dimpling, No nipple discharge or bleeding, No axillary adenopathy Heart: regular rate and rhythm Abdomen: soft, non-tender; no masses, no organomegaly Extremities: extremities normal, atraumatic, no cyanosis or edema Skin: skin color, texture, turgor normal. No rashes or lesions Lymph nodes: cervical, supraclavicular, and axillary nodes normal. Neurologic: grossly normal  Pelvic: External genitalia:  no lesions              No abnormal inguinal nodes palpated.              Urethra:  normal appearing urethra with no masses, tenderness or lesions              Bartholins and Skenes: normal                 Vagina: normal appearing vagina with normal color and discharge, no lesions              Cervix: no lesions              Pap taken: no Bimanual Exam:  Uterus:  normal size, contour, position, consistency, mobility, non-tender              Adnexa: no mass, fullness, tenderness              Rectal exam: yes.  Confirms.              Anus:  normal sphincter tone, no lesions  Chaperone was present for exam:  Gwenith Spitz, CMA  Assessment:   Encounter for breast and pelvic exam.  GYN exam for high risk Medicare patient.  Bilateral breast implants. Bilateral breast tenderness, chronic.  I suspect this may be related to her breast implants and significant weight loss.  Menopause. Osteopenia.  Hx fracture.  Anxiety and stress.  On Zoloft. Microscopic colitis with significant weight loss.  Hx CAD.   Plan: Mammogram screening discussed. Self breast awareness reviewed. Pap and HR HPV  2027 Guidelines for Calcium, Vitamin D, regular exercise program including cardiovascular and weight bearing exercise. BMD ordered.   Follow up annually and prn.   20 min  total time was spent for this patient encounter, including preparation, face-to-face counseling with the patient, coordination of  care, and documentation of the encounter in addition to doing breast and pelvic exam.

## 2023-01-28 ENCOUNTER — Encounter: Payer: Self-pay | Admitting: Obstetrics and Gynecology

## 2023-01-28 ENCOUNTER — Ambulatory Visit (INDEPENDENT_AMBULATORY_CARE_PROVIDER_SITE_OTHER): Payer: Medicare Other | Admitting: Obstetrics and Gynecology

## 2023-01-28 VITALS — BP 132/82 | HR 73 | Ht 61.61 in | Wt 101.2 lb

## 2023-01-28 DIAGNOSIS — Z01419 Encounter for gynecological examination (general) (routine) without abnormal findings: Secondary | ICD-10-CM

## 2023-01-28 DIAGNOSIS — Z9189 Other specified personal risk factors, not elsewhere classified: Secondary | ICD-10-CM | POA: Diagnosis not present

## 2023-01-28 DIAGNOSIS — N644 Mastodynia: Secondary | ICD-10-CM | POA: Diagnosis not present

## 2023-01-28 DIAGNOSIS — Z780 Asymptomatic menopausal state: Secondary | ICD-10-CM | POA: Diagnosis not present

## 2023-01-28 DIAGNOSIS — Z8781 Personal history of (healed) traumatic fracture: Secondary | ICD-10-CM | POA: Diagnosis not present

## 2023-01-28 NOTE — Patient Instructions (Signed)

## 2023-01-30 ENCOUNTER — Other Ambulatory Visit: Payer: Self-pay | Admitting: Obstetrics and Gynecology

## 2023-01-30 DIAGNOSIS — Z1231 Encounter for screening mammogram for malignant neoplasm of breast: Secondary | ICD-10-CM

## 2023-02-19 NOTE — Progress Notes (Unsigned)
Office Visit    Patient Name: Angelica [name] Date of Encounter: 02/20/2023  Primary Care Provider:  Patient, No Pcp Per Primary Cardiologist:  Olga Millers, MD  Chief Complaint    67 year old female with a history of CAD, bilateral carotid artery stenosis, right radial artery occlusion, palpitations, PACs, PAT, NSVT, hypertension, and hyperlipidemia who presents for follow-up related to CAD.   Past Medical History    Past Medical History:  Diagnosis Date   ALLERGIC RHINITIS 03/02/2007   BREAST CYST, LEFT 11/23/2009   CAD S/P percutaneous coronary angioplasty 01/2011   a) Exertional Back Pain & High RISK ST) - 95% Ostial LAD --> PCI with Promus DES 3.0 mm x 12 mm; b) LHC 08/2012: LAD stent Patent , 50% mid LAD; c) Neg Myoview 03/2012 d). LHC 12/09/2013 40-50% mid-LAD no progression, medical therapy. e) Neg nuc 2016, 2018.   Colitis 06/06/2020   microscopic lymphocytic colitis   HLD (hyperlipidemia)    Incomplete left bundle branch block    REACTIVE AIRWAY DISEASE 10/21/2008   Past Surgical History:  Procedure Laterality Date   AUGMENTATION MAMMAPLASTY Bilateral 1990   BREAST BIOPSY     BREAST SURGERY     BILAT IMPLANTS   LEFT HEART CATHETERIZATION WITH CORONARY ANGIOGRAM N/A 09/10/2012   Procedure: LEFT HEART CATHETERIZATION WITH CORONARY ANGIOGRAM;  Surgeon: Tonny Bollman, MD;  Location: [facility] CATH LAB;  Service: Cardiovascular;  Laterality: N/A;   LEFT HEART CATHETERIZATION WITH CORONARY ANGIOGRAM N/A 12/09/2013   Procedure: LEFT HEART CATHETERIZATION WITH CORONARY ANGIOGRAM;  Surgeon: Marykay Lex, MD;  Location: [facility] CATH LAB;  Service: Cardiovascular;  Laterality: N/A;   PERCUTANEOUS CORONARY STENT INTERVENTION (PCI-S)  01/2011   Promus Premier DES 3.0 mm x12 mm Ostial LAD (extends into LM with partial "jailing" of Cx)    Allergies  Allergies  Allergen Reactions   Prednisone Palpitations and Other (See Comments)   Crestor [Rosuvastatin] Other (See Comments)     Elevated LFTSs     Labs/Other Studies Reviewed    The following studies were reviewed today:  Cardiac Studies & Procedures     STRESS TESTS  NM PET CT CARDIAC PERFUSION MULTI W/ABSOLUTE BLOODFLOW 12/04/2021  Narrative   LV perfusion is normal. There is no evidence of ischemia. There is no evidence of infarction.   Rest left ventricular function is normal. Rest EF: 56 %. Stress left ventricular function is normal. Stress EF: 63 %. End diastolic cavity size is normal.   Myocardial blood flow was computed to be 1.57ml/g/min at rest and 3.79ml/g/min at stress. Global myocardial blood flow reserve was 3.14 and was normal.   Coronary calcium assessment not performed due to prior revascularization.   The study is normal. The study is low risk.   Electronically signed by Lennie Odor, MD  CLINICAL DATA:  This over-read does not include interpretation of cardiac or coronary anatomy or pathology. The Cardiac PET CT interpretation by the cardiologist is attached.  COMPARISON:  CT AP from 08/24/2012  FINDINGS: Vascular: No acute abnormality identified.  Mediastinum/Nodes: No signs of mediastinal mass or adenopathy.  Lungs/Pleura: No pleural effusion, airspace consolidation, atelectasis, or pneumothorax. No suspicious lung nodules identified.  Upper Abdomen: No acute abnormality.  Musculoskeletal: Status post bilateral breast augmentation. No acute or suspicious osseous findings.  IMPRESSION: 1. No significant noncardiac supplemental findings.   Electronically Signed By: Signa Kell M.D. On: 12/04/2021 10:07   ECHOCARDIOGRAM  ECHOCARDIOGRAM COMPLETE 01/03/2022  Narrative ECHOCARDIOGRAM REPORT    Patient Name:  Angelica [name] Date of Exam: 01/03/2022 Medical Rec #:  [id number]          Height:       61.5 in Accession #:    [id number]         Weight:       97.8 lb Date of Birth:  [birth year]         BSA:          1.402 m Patient Age:    65 years           BP:            140/82 mmHg Patient Gender: F                  HR:           108 bpm. Exam Location:  [address]  Procedure: 2D Echo, Cardiac Doppler and Color Doppler  Indications:    R06.00 Dyspnea  History:        Patient has prior history of Echocardiogram examinations, most recent 01/15/2011. CAD, Signs/Symptoms:Chest Pain; Risk Factors:Dyslipidemia. Dyspnea on exertion. Incomplete LBBB. Peripheral vascular disease. Palpitations.  Sonographer:    Cathie Beams RCS Referring Phys: [phone] Veida Spira C Sathvik Tiedt   Sonographer Comments: Image acquisition challenging due to breast implants. IMPRESSIONS   1. Left ventricular ejection fraction, by estimation, is 60 to 65%. Left ventricular ejection fraction by PLAX is 60 %. The left ventricle has normal function. The left ventricle has no regional wall motion abnormalities. Left ventricular diastolic parameters are consistent with Grade I diastolic dysfunction (impaired relaxation). 2. Right ventricular systolic function is normal. The right ventricular size is normal. There is normal pulmonary artery systolic pressure. The estimated right ventricular systolic pressure is 21.8 mmHg. 3. The mitral valve is abnormal. Trivial mitral valve regurgitation. 4. The aortic valve was not well visualized. Aortic valve regurgitation is not visualized. No aortic stenosis is present. 5. The inferior vena cava is normal in size with greater than 50% respiratory variability, suggesting right atrial pressure of 3 mmHg.  Comparison(s): Changes from prior study are noted. 01/15/2011: Abnormal stress echo.  FINDINGS Left Ventricle: Left ventricular ejection fraction, by estimation, is 60 to 65%. Left ventricular ejection fraction by PLAX is 60 %. The left ventricle has normal function. The left ventricle has no regional wall motion abnormalities. The left ventricular internal cavity size was normal in size. There is no left ventricular hypertrophy. Left ventricular diastolic  parameters are consistent with Grade I diastolic dysfunction (impaired relaxation). Indeterminate filling pressures.  Right Ventricle: The right ventricular size is normal. No increase in right ventricular wall thickness. Right ventricular systolic function is normal. There is normal pulmonary artery systolic pressure. The tricuspid regurgitant velocity is 2.17 m/s, and with an assumed right atrial pressure of 3 mmHg, the estimated right ventricular systolic pressure is 21.8 mmHg.  Left Atrium: Left atrial size was normal in size.  Right Atrium: Right atrial size was normal in size.  Pericardium: There is no evidence of pericardial effusion.  Mitral Valve: The mitral valve is abnormal. There is moderate calcification of the anterior and posterior mitral valve leaflet(s). Mild mitral annular calcification. Trivial mitral valve regurgitation.  Tricuspid Valve: The tricuspid valve is grossly normal. Tricuspid valve regurgitation is trivial.  Aortic Valve: The aortic valve was not well visualized. Aortic valve regurgitation is not visualized. No aortic stenosis is present.  Pulmonic Valve: The pulmonic valve was normal in structure. Pulmonic valve regurgitation is not visualized.  Aorta:  The aortic root and ascending aorta are structurally normal, with no evidence of dilitation.  Venous: The inferior vena cava is normal in size with greater than 50% respiratory variability, suggesting right atrial pressure of 3 mmHg.  IAS/Shunts: No atrial level shunt detected by color flow Doppler.   LEFT VENTRICLE PLAX 2D LV EF:         Left            Diastology ventricular     LV e' medial:    7.83 cm/s ejection        LV E/e' medial:  7.5 fraction by     LV e' lateral:   13.10 cm/s PLAX is 60      LV E/e' lateral: 4.5 %. LVIDd:         3.80 cm LVIDs:         2.60 cm LV PW:         0.60 cm LV IVS:        0.70 cm LVOT diam:     1.70 cm LV SV:         40 LV SV Index:   28 LVOT Area:     2.27  cm   RIGHT VENTRICLE RV Basal diam:  2.50 cm RV S prime:     13.20 cm/s TAPSE (M-mode): 2.1 cm RVSP:           21.8 mmHg  LEFT ATRIUM             Index        RIGHT ATRIUM           Index LA diam:        2.00 cm 1.43 cm/m   RA Pressure: 3.00 mmHg LA Vol (A2C):   26.5 ml 18.90 ml/m  RA Area:     8.73 cm LA Vol (A4C):   9.4 ml  6.73 ml/m   RA Volume:   16.60 ml  11.84 ml/m LA Biplane Vol: 16.5 ml 11.77 ml/m AORTIC VALVE LVOT Vmax:   89.10 cm/s LVOT Vmean:  60.800 cm/s LVOT VTI:    0.176 m  AORTA Ao Root diam: 2.60 cm  MITRAL VALVE               TRICUSPID VALVE MV Area (PHT): 3.99 cm    TR Peak grad:   18.8 mmHg MV Decel Time: 190 msec    TR Vmax:        217.00 cm/s MV E velocity: 58.70 cm/s  Estimated RAP:  3.00 mmHg MV A velocity: 65.00 cm/s  RVSP:           21.8 mmHg MV E/A ratio:  0.90 SHUNTS Systemic VTI:  0.18 m Systemic Diam: 1.70 cm  Zoila Shutter MD Electronically signed by Zoila Shutter MD Signature Date/Time: 01/03/2022/11:04:58 PM    Final    MONITORS  CARDIAC EVENT MONITOR 12/10/2016  Narrative Sinus with pacs, brief PAT and 6 beats NSVT Olga Millers          Recent Labs: No results found for requested labs within last 365 days.  Recent Lipid Panel    Component Value Date/Time   CHOL 220 (H) 06/07/2022 1127   TRIG 73 06/07/2022 1127   TRIG 67 03/13/2006 1112   HDL 133 06/07/2022 1127   CHOLHDL 2.2 12/14/2021 1049   CHOLHDL 2 03/30/2014 0818   VLDL 9.6 03/30/2014 0818   LDLCALC 74 06/07/2022 1127   LDLDIRECT 101.2 11/15/2008 0943    History  of Present Illness    67 year old female with the above past medical history including CAD, bilateral carotid artery stenosis, right radial artery occlusion, palpitations, PACs, PAT, NSVT, hypertension, and hyperlipidemia who presents for follow-up related to CAD and chest pain.   Stress echo in September 2012 was abnormal with nonsustained VT and ischemia in the anteroseptal wall and apex.  She  underwent cardiac catheterization in September 2012 which revealed a 95% oLAD lesion s/p DES. There was some jailing of the LCx. Cath at [facility] in December 2012 in the setting of palpitations and chest pain revealed patency of LAD stent, no other significant CAD.  Carotid Dopplers in December 2012 showed 1-39% BICA stenosis.  Doppler in January 2013 showed probable occlusion of right radial with good collaterals. She continued to have chest pain.  Repeat catheterization in May 2014 revealed normal left main, patent stent to LAD, mLAD 50%, 30-40% oLCx, EF 55%. Repeat catheterization in 2015 showed no change (nonobstructive CAD).  Outpatient cardiac monitor in the setting of palpitations showed sinus rhythm with PACs, brief PAT, and 6 beats of NSVT.  Previously on beta-blocker, however this was discontinued due to significant fatigue. Stress test in September 2022 showed EF 61%, prior infarct with what was described as subtle peri-infarct ischemia. She was seen in the office in March 2023 and reported occasional back and chest pain that last for seconds at a time, unchanged from previous, she denied exertional symptoms concerning for angina. Given reassuring nuclear study in 2022, medical therapy was recommended. Additionally, she was referred to the lipid clinic for consideration of PCSK9 inhibitor.  She was evaluated in the ED in May 2023 in the setting of nonexertional chest pain, shortness of breath.  Follow-up cardiac PET stress test was normal.  Echocardiogram showed EF 60 to 65%, normal LV function, no RWMA, G1 DD, no significant valvular abnormalities. She was last seen in the office on 08/12/2022 and was stable from a cardiac standpoint. She noted occasional vague nonspecific chest pain not related to activities, unchanged in severity or frequency.   She presents today for follow-up. Since her last visit she has done well from a cardiac standpoint.  She notes that she is gaining weight and has been  participating in physical therapy, increasing her activity and feeling much stronger.  She notes occasional fleeting palpitations, occasional nonspecific chest discomfort, unchanged from prior visits.  She denies any new symptoms or concerns, overall, she reports feeling well.   Home Medications    Current Outpatient Medications  Medication Sig Dispense Refill   aspirin EC 81 MG tablet Take 81 mg by mouth every evening.     Coenzyme Q10 (CO Q 10 PO) Take by mouth.     dicyclomine (BENTYL) 10 MG capsule Take 10 mg by mouth 2 (two) times daily.     ergocalciferol (VITAMIN D2) 1.25 MG (50000 UT) capsule TAKE 1 CAPSULE ONCE WEEKLY     ibuprofen (ADVIL,MOTRIN) 200 MG tablet Take 200-400 mg by mouth every 6 (six) hours as needed for pain or headache.     Multiple Vitamins-Minerals (MULTI-VITAMIN GUMMIES PO) Take by mouth.     nitroGLYCERIN (NITROSTAT) 0.4 MG SL tablet Place 1 tablet (0.4 mg total) under the tongue every 5 (five) minutes as needed for chest pain. 25 tablet 3   pravastatin (PRAVACHOL) 40 MG tablet Take by mouth.     sertraline (ZOLOFT) 100 MG tablet Take 100 mg by mouth daily.     sertraline (ZOLOFT) 50 MG tablet Take  1 tablet (50 mg total) by mouth daily. 30 tablet 1   colestipol (COLESTID) 1 g tablet Take 2 g by mouth 2 (two) times daily. (Patient not taking: Reported on 02/20/2023)     ezetimibe (ZETIA) 10 MG tablet Take 1 tablet (10 mg total) by mouth daily. 90 tablet 3   No current facility-administered medications for this visit.     Review of Systems    She denies chest pain, dyspnea, pnd, orthopnea, n, v, dizziness, syncope, edema, weight gain, or early satiety. All other systems reviewed and are otherwise negative except as noted above.   Physical Exam    VS:  BP 116/82 (BP Location: Left Arm, Patient Position: Sitting, Cuff Size: Normal)   Pulse 86   Ht 5' (1.524 m)   Wt 102 lb 3.2 oz (46.4 kg)   LMP 02/03/2013 (Approximate)   SpO2 96%   BMI 19.96 kg/m   GEN:  Well nourished, well developed, in no acute distress. HEENT: normal. Neck: Supple, no JVD, carotid bruits, or masses. Cardiac: RRR, no murmurs, rubs, or gallops. No clubbing, cyanosis, edema.  Radials/DP/PT 2+ and equal bilaterally.  Respiratory:  Respirations regular and unlabored, clear to auscultation bilaterally. GI: Soft, nontender, nondistended, BS + x 4. MS: no deformity or atrophy. Skin: warm and dry, no rash. Neuro:  Strength and sensation are intact. Psych: Normal affect.  Accessory Clinical Findings    ECG personally reviewed by me today - EKG Interpretation Date/Time:  Thursday February 20 2023 10:13:44 EDT Ventricular Rate:  72 PR Interval:  170 QRS Duration:  112 QT Interval:  428 QTC Calculation: 468 R Axis:   -61  Text Interpretation: Normal sinus rhythm Left axis deviation Minimal voltage criteria for LVH, may be normal variant ( Cornell product ) Incomplete left bundle branch block Confirmed by Bernadene Person ([id number]) on 02/20/2023 10:15:40 AM  - no acute changes.   Lab Results  Component Value Date   WBC 8.2 09/10/2021   HGB 14.0 09/10/2021   HCT 41.8 09/10/2021   MCV 103.2 (H) 09/10/2021   PLT 295 09/10/2021   Lab Results  Component Value Date   CREATININE 0.48 09/10/2021   BUN 9 09/10/2021   NA 139 09/10/2021   K 4.0 09/10/2021   CL 105 09/10/2021   CO2 27 09/10/2021   Lab Results  Component Value Date   ALT 17 12/14/2021   AST 24 12/14/2021   ALKPHOS 63 12/14/2021   BILITOT 1.0 12/14/2021   Lab Results  Component Value Date   CHOL 220 (H) 06/07/2022   HDL 133 06/07/2022   LDLCALC 74 06/07/2022   LDLDIRECT 101.2 11/15/2008   TRIG 73 06/07/2022   CHOLHDL 2.2 12/14/2021    Lab Results  Component Value Date   HGBA1C 4.9 06/07/2022    Assessment & Plan    1. CAD/chest pain/shortness of breath: S/p DES-LAD in 2012. Cath in May 2014 revealed normal left main, patent stent to LAD, mLAD 50%, 30-40% oLCx, EF 55%. Repeat cath in 2015 showed no  change (nonobstructive CAD). Stress test in September 2022 showed EF 61%, prior infarct with what was described as subtle peri-infarct ischemia.  Cardiac PET stress test in 12/2021 in the setting of chest pain showed no evidence of ischemia, low risk, EF 63%.  Follow-up echocardiogram in the setting of shortness of breath was normal.  Overall, she has been doing well, denies symptoms concerning for angina.  Continue aspirin, pravastatin, Zetia.  2. Palpitations: Cardiac monitor in 2018 showed  sinus rhythm with PACs, brief PAT, and 6 beats of NSVT.  She reports rare fleeting palpitations. No associated symptoms.  Stable.   3. Carotid artery stenosis: Carotid Dopplers in December 2012 showed 1 -39% BICA stenosis.  Asymptomatic.  No indication for repeat testing at this time.  Continue aspirin, pravastatin, Zetia.   4. Hypertension: BP well controlled.  She is not on any medications for BP at this time.   5. Hyperlipidemia: Most recent LDL in 06/2022 was 74 (down from 125).  She was previously referred to the lipid clinic, and felt that Repatha would be too costly.   She had repeat fasting lipids, LFTs per her PCP today.  If LDL is significantly elevated, she would like to consider follow-up with a lipid clinic PharmD.  For now, continue pravastatin, Zetia.   6. Disposition: Follow-up in 6 months with Dr. Jens Som.       Joylene Grapes, NP 02/20/2023, 10:48 AM

## 2023-02-20 ENCOUNTER — Encounter: Payer: Self-pay | Admitting: Nurse Practitioner

## 2023-02-20 ENCOUNTER — Ambulatory Visit: Payer: Medicare Other | Attending: Nurse Practitioner | Admitting: Nurse Practitioner

## 2023-02-20 VITALS — BP 116/82 | HR 86 | Ht 60.0 in | Wt 102.2 lb

## 2023-02-20 DIAGNOSIS — I2510 Atherosclerotic heart disease of native coronary artery without angina pectoris: Secondary | ICD-10-CM | POA: Insufficient documentation

## 2023-02-20 DIAGNOSIS — I6523 Occlusion and stenosis of bilateral carotid arteries: Secondary | ICD-10-CM | POA: Diagnosis not present

## 2023-02-20 DIAGNOSIS — E785 Hyperlipidemia, unspecified: Secondary | ICD-10-CM | POA: Diagnosis present

## 2023-02-20 DIAGNOSIS — I10 Essential (primary) hypertension: Secondary | ICD-10-CM | POA: Insufficient documentation

## 2023-02-20 DIAGNOSIS — R002 Palpitations: Secondary | ICD-10-CM | POA: Insufficient documentation

## 2023-02-20 NOTE — Patient Instructions (Signed)
Medication Instructions:  Your physician recommends that you continue on your current medications as directed. Please refer to the Current Medication list given to you today.  *If you need a refill on your cardiac medications before your next appointment, please call your pharmacy*   Lab Work: NONE ordered at this time of appointment     Testing/Procedures: NONE ordered at this time of appointment     Follow-Up: At [facility], you and your health needs are our priority.  As part of our continuing mission to provide you with exceptional heart care, we have created designated Provider Care Teams.  These Care Teams include your primary Cardiologist (physician) and Advanced Practice Providers (APPs -  Physician Assistants and Nurse Practitioners) who all work together to provide you with the care you need, when you need it.  We recommend signing up for the patient portal called "MyChart".  Sign up information is provided on this After Visit Summary.  MyChart is used to connect with patients for Virtual Visits (Telemedicine).  Patients are able to view lab/test results, encounter notes, upcoming appointments, etc.  Non-urgent messages can be sent to your provider as well.   To learn more about what you can do with MyChart, go to [email or web address].    Your next appointment:   6 month(s)  Provider:   Olga Millers, MD     Other Instructions Please call back Mid November to make 6 month appointment.

## 2023-02-24 DIAGNOSIS — E785 Hyperlipidemia, unspecified: Secondary | ICD-10-CM

## 2023-03-05 ENCOUNTER — Other Ambulatory Visit: Payer: Medicare Other

## 2023-03-05 ENCOUNTER — Ambulatory Visit: Payer: Medicare Other

## 2023-03-31 ENCOUNTER — Other Ambulatory Visit (HOSPITAL_COMMUNITY): Payer: Self-pay

## 2023-03-31 ENCOUNTER — Telehealth: Payer: Self-pay | Admitting: Pharmacist

## 2023-03-31 ENCOUNTER — Ambulatory Visit: Payer: Medicare Other | Attending: Cardiology | Admitting: Pharmacist

## 2023-03-31 ENCOUNTER — Telehealth: Payer: Self-pay

## 2023-03-31 ENCOUNTER — Encounter: Payer: Self-pay | Admitting: Pharmacist

## 2023-03-31 DIAGNOSIS — I2583 Coronary atherosclerosis due to lipid rich plaque: Secondary | ICD-10-CM | POA: Diagnosis present

## 2023-03-31 DIAGNOSIS — I2510 Atherosclerotic heart disease of native coronary artery without angina pectoris: Secondary | ICD-10-CM | POA: Diagnosis not present

## 2023-03-31 DIAGNOSIS — E785 Hyperlipidemia, unspecified: Secondary | ICD-10-CM

## 2023-03-31 NOTE — Telephone Encounter (Signed)
Pharmacy Patient Advocate Encounter   Received notification from Physician's Office that prior authorization for REPATHA is required/requested.   Insurance verification completed.   The patient is insured through [employer] [facility] .   Per test claim: PA required; PA submitted to above mentioned insurance via CoverMyMeds Key/confirmation #/EOC [facility] Status is pending

## 2023-03-31 NOTE — Telephone Encounter (Signed)
Please complete PA for Repatha 

## 2023-03-31 NOTE — Patient Instructions (Addendum)
It was nice meeting you today  We would like your LDL (bad cholesterol) to be less than 70  Please continue your pravastatin 20mg  and and ezetimibe 10mg  daily  The medication we spoke about today is called Repatha which is an injection you would take once every 2 weeks  I will complete the prior authorization for you and contact you with the result  Once you start the medication we would like to recheck your fasting lipid panel in 2-3 months  Let us know if you have any questions  Laural Golden, PharmD, BCACP, CDCES, CPP [address], [address] [place], [place], [address] Phone: [phone], Fax: [phone]

## 2023-03-31 NOTE — Telephone Encounter (Signed)
PA request has been Submitted. New Encounter created for follow up. For additional info see Pharmacy Prior Auth telephone encounter from 03/31/23.

## 2023-03-31 NOTE — Progress Notes (Signed)
Patient ID: [name]                 DOB: 10/13/1955                    MRN: [id number]     HPI: Angelica [name] is a 67 y.o. female patient referred to lipid clinic by Angelica [name]. Patient of Dr Jens Som. PMH is significant for CAD, PVD, and angina.   In 2022, patient diagnosed with  lymphocytic colitis which resulted in a large amount of weight loss.  Currently managed on pravastatin and ezetimibe. Was previously taking rosuvastatin which was discontinued die to elevated liver enzymes.  Underwent PCI in 2012 and then coronary catheterization in 2014 and again in 2015.  Current Medications:  Pravastatin Ezetimbe  Intolerances:  Rosuvastatin Simvastatin  Risk Factors:  CAD PVD Angina  LDL goal: <70  Labs: TC 194, HDL 98, LDL 80, Trigs 92 (62/95/28)  Past Medical History:  Diagnosis Date   ALLERGIC RHINITIS 03/02/2007   BREAST CYST, LEFT 11/23/2009   CAD S/P percutaneous coronary angioplasty 01/2011   a) Exertional Back Pain & High RISK ST) - 95% Ostial LAD --> PCI with Promus DES 3.0 mm x 12 mm; b) LHC 08/2012: LAD stent Patent , 50% mid LAD; c) Neg Myoview 03/2012 d). LHC 12/09/2013 40-50% mid-LAD no progression, medical therapy. e) Neg nuc 2016, 2018.   Colitis 06/06/2020   microscopic lymphocytic colitis   HLD (hyperlipidemia)    Incomplete left bundle branch block    REACTIVE AIRWAY DISEASE 10/21/2008    Current Outpatient Medications on File Prior to Visit  Medication Sig Dispense Refill   aspirin EC 81 MG tablet Take 81 mg by mouth every evening.     Coenzyme Q10 (CO Q 10 PO) Take by mouth.     colestipol (COLESTID) 1 g tablet Take 2 g by mouth 2 (two) times daily. (Patient not taking: Reported on 02/20/2023)     dicyclomine (BENTYL) 10 MG capsule Take 10 mg by mouth 2 (two) times daily.     ergocalciferol (VITAMIN D2) 1.25 MG (50000 UT) capsule TAKE 1 CAPSULE ONCE WEEKLY     ezetimibe (ZETIA) 10 MG tablet Take 1 tablet (10 mg total) by mouth daily.  90 tablet 3   ibuprofen (ADVIL,MOTRIN) 200 MG tablet Take 200-400 mg by mouth every 6 (six) hours as needed for pain or headache.     Multiple Vitamins-Minerals (MULTI-VITAMIN GUMMIES PO) Take by mouth.     nitroGLYCERIN (NITROSTAT) 0.4 MG SL tablet Place 1 tablet (0.4 mg total) under the tongue every 5 (five) minutes as needed for chest pain. 25 tablet 3   pravastatin (PRAVACHOL) 40 MG tablet Take by mouth.     sertraline (ZOLOFT) 100 MG tablet Take 100 mg by mouth daily.     sertraline (ZOLOFT) 50 MG tablet Take 1 tablet (50 mg total) by mouth daily. 30 tablet 1   No current facility-administered medications on file prior to visit.    Allergies  Allergen Reactions   Prednisone Palpitations and Other (See Comments)   Crestor [Rosuvastatin] Other (See Comments)    Elevated LFTSs    Assessment/Plan:  1. Hyperlipidemia - Patient last LDL was 80 which is above goal of <70. Intolerant to statins with higher intensity than pravastatin 20mg . Due to CAD, recommend starting PCSK9i or inclisiran.  Using demo pen, educated patient on mechanism of action, storage, site selection, administration, and possible adverse effects. Will complete PA and  contact patient with result. Recheck lipid panel in 2-3 months.  Continue pravastatin 20mg  daily Continue ezetimbe 10mg  daily Start repatha 140mg  q 2 weeks Recheck in 2-3 months  Laural Golden, PharmD, BCACP, CDCES, CPP [address], [address] [place], [place], [address] Phone: [phone], Fax: [phone]

## 2023-04-01 NOTE — Telephone Encounter (Signed)
Pharmacy Patient Advocate Encounter  Received notification from [employer] [facility] that Prior Authorization for REPATHA has been APPROVED from 03/31/23 to 05/06/23

## 2023-04-02 MED ORDER — REPATHA SURECLICK 140 MG/ML ~~LOC~~ SOAJ
1.0000 mL | SUBCUTANEOUS | 1 refills | Status: AC
Start: 2023-04-02 — End: ?

## 2023-04-23 ENCOUNTER — Ambulatory Visit: Payer: Medicare Other

## 2023-04-23 ENCOUNTER — Other Ambulatory Visit: Payer: Medicare Other

## 2023-05-28 ENCOUNTER — Other Ambulatory Visit (HOSPITAL_COMMUNITY): Payer: Self-pay

## 2023-06-13 ENCOUNTER — Other Ambulatory Visit: Payer: Self-pay | Admitting: Cardiology

## 2023-06-13 LAB — HEMOGLOBIN A1C: Hemoglobin A1C: 4.9

## 2023-06-13 LAB — POCT ABI - SCREENING FOR PILOT NO CHARGE
Left ABI: 1.09
Right ABI: 1.1

## 2023-06-13 NOTE — Progress Notes (Signed)
 Patient attended Heart Event. Blood Pressure141/80 advised to contact PCP to lower blood pressure. A1C 4.9 and ABI 1.1 right 1.09 left

## 2023-06-14 LAB — LIPID PANEL W/O CHOL/HDL RATIO
Cholesterol, Total: 217 mg/dL — ABNORMAL HIGH (ref 100–199)
HDL: 104 mg/dL (ref >39)
LDL Chol Calc (NIH): 98 mg/dL (ref 0–99)
Triglycerides: 86 mg/dL (ref 0–149)
VLDL Cholesterol Cal: 15 mg/dL (ref 5–40)

## 2023-06-18 ENCOUNTER — Encounter: Payer: Self-pay | Admitting: Obstetrics and Gynecology

## 2023-06-18 ENCOUNTER — Ambulatory Visit: Payer: Medicare Other

## 2023-06-18 DIAGNOSIS — Z1231 Encounter for screening mammogram for malignant neoplasm of breast: Secondary | ICD-10-CM | POA: Diagnosis not present

## 2023-06-18 DIAGNOSIS — Z780 Asymptomatic menopausal state: Secondary | ICD-10-CM | POA: Diagnosis not present

## 2023-06-19 ENCOUNTER — Telehealth: Payer: Self-pay

## 2023-06-19 NOTE — Telephone Encounter (Signed)
Attempted to contact patient regarding lab results. Left message on voicemail requesting a return call.

## 2023-06-21 ENCOUNTER — Encounter: Payer: Self-pay | Admitting: Obstetrics and Gynecology

## 2023-06-22 ENCOUNTER — Encounter: Payer: Self-pay | Admitting: Cardiology

## 2023-06-23 ENCOUNTER — Telehealth: Payer: Self-pay

## 2023-06-23 NOTE — Telephone Encounter (Signed)
Patient returned call, was informed Lipid panel results, discussed. Patient to discuss with cardiologist at upcoming appointment.

## 2023-08-07 NOTE — Progress Notes (Unsigned)
 The patient attended a screening event on 06/13/23, where her blood pressure was measured at 141/80 mmHg after a second check, and her A1C was 4.9%. During the event, the patient reported that she does not smoke, has Medicare as her insurance, is established with a primary care provider ([facility]?), and does not have any documented SDOH needs.   A chart review confirmed that the patient does not have an active PCP listed. Chart review indicates that Dr. Olga Millers - [facility] is the pt's Cardiologist. Her last appt with him was on 08/12/22, she does have an upcoming appt on 10/06/23 at 8:40 AM. Pt's bp is currently being addressed by this provider. Chart review further indicates a CHL visible annual wellness exam with Dr. Jasper Loser at [facility] on 02/24/2023. The pt has an upcoming appt on 08/14/23 and 08/25/23 with this provider. According to the appt notes her PCP is Dr. Velta Addison also located at [facility] where her last visit with him was on 08/05/22.   CHW called [facility] office to obtain PCP status. Pt does see Dr. Avel Peace as her current PCP. Chart has been updated to reflect this.   The patient demonstrates consistent engagement in her care.   At this time, no additional support from the Health [facility] Team is indicated.

## 2023-09-23 NOTE — Progress Notes (Signed)
 HPI: FU coronary disease. Previously seen for chest pain. Stress echo was markedly abnormal with nonsustained ventricular tachycardia and ischemia in the anteroseptal wall and apex. Patient underwent cardiac catheterization in September of 2012 which revealed 95% ostial LAD lesion. The patient had PCI with a drug-eluting stent. There was some jailing of the circumflex. Repeat cath at [facility] in Dec 2012 because of palpitations and chest pain revealed patency of LAD stent and she had no other significant CAD. Carotid Dopplers December 2012 showed 0-39% bilateral stenosis. 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 Patient has continued to have chest pain. She had repeat catheterization in May of 2014 which revealed normal left main, patent LAD stent, 50% mid LAD; 30-40% ostial circumflex. Ejection fraction was 55%. Again had repeat cath 8/15 that showed no change (nonobstructive CAD). Monitor 8/18 showed sinus with pacs, brief PAT and 6 beats NSVT.  Follow-up echocardiogram August 2023 showed normal LV function, grade 1 diastolic dysfunction.Aaron Aas  PET scan August 2023 showed normal perfusion and ejection fraction 56%.  Since last seen she denies increased dyspnea, palpitations or syncope.  She has not had exertional chest pain.  Current Outpatient Medications  Medication Sig Dispense Refill   aspirin  EC 81 MG tablet Take 81 mg by mouth every evening.     Coenzyme Q10 (CO Q 10 PO) Take by mouth.     dicyclomine (BENTYL) 10 MG capsule Take 10 mg by mouth 2 (two) times daily.     ergocalciferol  (VITAMIN D2) 1.25 MG (50000 UT) capsule TAKE 1 CAPSULE ONCE WEEKLY     Evolocumab  (REPATHA  SURECLICK) 140 MG/ML SOAJ Inject 140 mg into the skin every 14 (fourteen) days. 6 mL 1   ibuprofen (ADVIL,MOTRIN) 200 MG tablet Take 200-400 mg by mouth every 6 (six) hours as needed for pain or headache.     Multiple Vitamins-Minerals (MULTI-VITAMIN GUMMIES PO) Take by mouth.      nitroGLYCERIN  (NITROSTAT ) 0.4 MG SL tablet Place 1 tablet (0.4 mg total) under the tongue every 5 (five) minutes as needed for chest pain. 25 tablet 3   pravastatin (PRAVACHOL) 40 MG tablet Take by mouth.     sertraline  (ZOLOFT ) 100 MG tablet Take 100 mg by mouth daily.     sertraline  (ZOLOFT ) 50 MG tablet Take 1 tablet (50 mg total) by mouth daily. 30 tablet 1   ezetimibe  (ZETIA ) 10 MG tablet Take 1 tablet (10 mg total) by mouth daily. 90 tablet 3   No current facility-administered medications for this visit.     Past Medical History:  Diagnosis Date   ALLERGIC RHINITIS 03/02/2007   BREAST CYST, LEFT 11/23/2009   CAD S/P percutaneous coronary angioplasty 01/2011   a) Exertional Back Pain & High RISK ST) - 95% Ostial LAD --> PCI with Promus DES 3.0 mm x 12 mm; b) LHC 08/2012: LAD stent Patent , 50% mid LAD; c) Neg Myoview  03/2012 d). LHC 12/09/2013 40-50% mid-LAD no progression, medical therapy. e) Neg nuc 2016, 2018.   Colitis 06/06/2020   microscopic lymphocytic colitis   HLD (hyperlipidemia)    Incomplete left bundle branch block    REACTIVE AIRWAY DISEASE 10/21/2008    Past Surgical History:  Procedure Laterality Date   AUGMENTATION MAMMAPLASTY Bilateral 1990   BREAST BIOPSY     BREAST SURGERY     BILAT IMPLANTS   LEFT HEART CATHETERIZATION WITH CORONARY ANGIOGRAM N/A 09/10/2012   Procedure: LEFT HEART CATHETERIZATION WITH CORONARY ANGIOGRAM;  Surgeon: Arnoldo Lapping, MD;  Location: [facility] CATH LAB;  Service: Cardiovascular;  Laterality: N/A;   LEFT HEART CATHETERIZATION WITH CORONARY ANGIOGRAM N/A 12/09/2013   Procedure: LEFT HEART CATHETERIZATION WITH CORONARY ANGIOGRAM;  Surgeon: Arleen Lacer, MD;  Location: [facility] CATH LAB;  Service: Cardiovascular;  Laterality: N/A;   PERCUTANEOUS CORONARY STENT INTERVENTION (PCI-S)  01/2011   Promus Premier DES 3.0 mm x12 mm Ostial LAD (extends into LM with partial "jailing" of Cx)    Social History   Socioeconomic History   Marital status:  Married    Spouse name: Not on file   Number of children: Not on file   Years of education: Not on file   Highest education level: Not on file  Occupational History    Employer: [employer]    Comment: Industrial/product designer  Tobacco Use   Smoking status: Never    Passive exposure: Never   Smokeless tobacco: Never  Vaping Use   Vaping status: Never Used  Substance and Sexual Activity   Alcohol use: Not Currently   Drug use: No   Sexual activity: Not Currently    Partners: Male    Birth control/protection: Post-menopausal    Comment: first intercourse >16, 5 Partners  Other Topics Concern   Not on file  Social History Narrative   Occupation:    Never Smoked   Alcohol use- no   Married   Drug use- no   Regular Exercise- yes    Social Drivers of Corporate investment banker Strain: Low Risk  (08/14/2023)   Received from [facility]   Overall Financial Resource Strain (CARDIA)    Difficulty of Paying Living Expenses: Not hard at all  Food Insecurity: No Food Insecurity (08/14/2023)   Received from [facility]   Hunger Vital Sign    Worried About Running Out of Food in the Last Year: Never true    Ran Out of Food in the Last Year: Never true  Transportation Needs: No Transportation Needs (08/14/2023)   Received from [facility] - Transportation    Lack of Transportation (Medical): No    Lack of Transportation (Non-Medical): No  Physical Activity: Insufficiently Active (08/14/2023)   Received from [facility]   Exercise Vital Sign    Days of Exercise per Week: 2 days    Minutes of Exercise per Session: 30 min  Stress: Stress Concern Present (08/14/2023)   Received from [facility] of Occupational Health - Occupational Stress Questionnaire    Feeling of Stress : Rather much  Social Connections: Somewhat Isolated (08/14/2023)   Received from [facility]   Social Network    How would you rate your social network (family, work,  friends)?: Restricted participation with some degree of social isolation  Intimate Partner Violence: Unknown (08/14/2023)   Received from [facility]   HITS    Over the last 12 months how often did your partner physically hurt you?: Never    Insult or Talk Down To: Not on file    Over the last 12 months how often did your partner threaten you with physical harm?: Never    Scream or Curse: Not on file    Family History  Problem Relation Age of Onset   Alcohol abuse Other    Diabetes Other    Hypertension Other    Heart disease Other     ROS: no fevers or chills, productive cough, hemoptysis, dysphasia, odynophagia, melena, hematochezia, dysuria, hematuria,  rash, seizure activity, orthopnea, PND, pedal edema, claudication. Remaining systems are negative.  Physical Exam: Well-developed well-nourished in no acute distress.  Skin is warm and dry.  HEENT is normal.  Neck is supple.  Chest is clear to auscultation with normal expansion.  Cardiovascular exam is regular rate and rhythm.  Abdominal exam nontender or distended. No masses palpated. Extremities show no edema. neuro grossly intact  EKG Interpretation Date/Time:  Monday October 06 2023 07:57:12 EDT Ventricular Rate:  71 PR Interval:  160 QRS Duration:  82 QT Interval:  386 QTC Calculation: 419 R Axis:   -23  Text Interpretation: Normal sinus rhythm Low voltage QRS Cannot rule out Anteroseptal infarct Confirmed by Alexandria Angel ([id number]) on 10/06/2023 7:58:29 AM inferior T wave changes new compared to previous.   A/P  1 chest pain-no recent exertional chest pain.  Continue to follow.  2 coronary artery disease-continue medical therapy with aspirin  and statin.  3 hypertension-patient's blood pressure is elevated.  She will follow this at home and we will add antihypertensive as needed.  Goal systolic blood pressure less than 130 and diastolic less than 85.  4 history of palpitations-no recent symptoms.  Beta-blocker  discontinued previously secondary to fatigue.  5 hyperlipidemia-previous significant elevation in liver functions with Crestor .  Continue pravastatin and Zetia .  Check lipids and liver.  If LDL not at goal will refer for PCSK9 inhibitor.  Alexandria Angel, MD

## 2023-10-06 ENCOUNTER — Encounter: Payer: Self-pay | Admitting: Cardiology

## 2023-10-06 ENCOUNTER — Ambulatory Visit: Payer: Medicare Other | Attending: Cardiology | Admitting: Cardiology

## 2023-10-06 VITALS — BP 144/86 | HR 71 | Ht 62.0 in | Wt 102.4 lb

## 2023-10-06 DIAGNOSIS — I10 Essential (primary) hypertension: Secondary | ICD-10-CM | POA: Diagnosis not present

## 2023-10-06 DIAGNOSIS — E785 Hyperlipidemia, unspecified: Secondary | ICD-10-CM | POA: Diagnosis not present

## 2023-10-06 DIAGNOSIS — R072 Precordial pain: Secondary | ICD-10-CM | POA: Insufficient documentation

## 2023-10-06 DIAGNOSIS — I2510 Atherosclerotic heart disease of native coronary artery without angina pectoris: Secondary | ICD-10-CM | POA: Insufficient documentation

## 2023-10-06 NOTE — Patient Instructions (Signed)
 Medication Instructions:  Your physician recommends that you continue on your current medications as directed. Please refer to the Current Medication list given to you today.    *If you need a refill on your cardiac medications before your next appointment, please call your pharmacy*   Lab Work: LIPID  LIVER FUNCTION TEST    If you have labs (blood work) drawn today and your tests are completely normal, you will receive your results only by: MyChart Message (if you have MyChart) OR A paper copy in the mail If you have any lab test that is abnormal or we need to change your treatment, we will call you to review the results.   Testing/Procedures: NONE    Follow-Up: At [facility], you and your health needs are our priority.  As part of our continuing mission to provide you with exceptional heart care, we have created designated Provider Care Teams.  These Care Teams include your primary Cardiologist (physician) and Advanced Practice Providers (APPs -  Physician Assistants and Nurse Practitioners) who all work together to provide you with the care you need, when you need it.  We recommend signing up for the patient portal called "MyChart".  Sign up information is provided on this After Visit Summary.  MyChart is used to connect with patients for Virtual Visits (Telemedicine).  Patients are able to view lab/test results, encounter notes, upcoming appointments, etc.  Non-urgent messages can be sent to your provider as well.   To learn more about what you can do with MyChart, go to [email or web address].    Your next appointment:   1 year(s)  The format for your next appointment:   In Person  Provider:   Alexandria Angel, MD   Other Instructions

## 2023-10-07 ENCOUNTER — Ambulatory Visit: Payer: Self-pay | Admitting: Cardiology

## 2023-10-07 LAB — LIPID PANEL
Chol/HDL Ratio: 1.8 ratio (ref 0.0–4.4)
Cholesterol, Total: 185 mg/dL (ref 100–199)
HDL: 101 mg/dL (ref >39)
LDL Chol Calc (NIH): 72 mg/dL (ref 0–99)
Triglycerides: 62 mg/dL (ref 0–149)
VLDL Cholesterol Cal: 12 mg/dL (ref 5–40)

## 2023-10-07 LAB — HEPATIC FUNCTION PANEL
ALT: 23 IU/L (ref 0–32)
AST: 32 IU/L (ref 0–40)
Albumin: 4.8 g/dL (ref 3.9–4.9)
Alkaline Phosphatase: 60 IU/L (ref 44–121)
Bilirubin Total: 0.6 mg/dL (ref 0.0–1.2)
Bilirubin, Direct: 0.25 mg/dL (ref 0.00–0.40)
Total Protein: 7.5 g/dL (ref 6.0–8.5)

## 2024-03-09 NOTE — Progress Notes (Incomplete)
 68 y.o. G0P0000 postmenopausal female here for annual exam. Married. PCP: Beam, Lamar POUR, MD   She reports ***. Urine sample provided: ***  Postmenopausal bleeding: *** Pelvic discharge or pain: *** Breast mass, nipple discharge or skin changes : *** Sexually active: ***   Last PAP:     Component Value Date/Time   DIAGPAP  06/02/2020 1623    - Negative for intraepithelial lesion or malignancy (NILM)   DIAGPAP  04/07/2017 0000    NEGATIVE FOR INTRAEPITHELIAL LESIONS OR MALIGNANCY.   HPVHIGH Negative 06/02/2020 1623   ADEQPAP  06/02/2020 1623    Satisfactory for evaluation; transformation zone component PRESENT.   ADEQPAP  04/07/2017 0000    Satisfactory for evaluation  endocervical/transformation zone component PRESENT.   Last mammogram: 07/13/19 BIRADS 2 Last DXA: 06/18/23 *** Last colonoscopy: 12/14/07 q10y  Exercising: *** Smoker:***       GYN HISTORY: ***  OB History  Gravida Para Term Preterm AB Living  0 0 0 0 0 0  SAB IAB Ectopic Multiple Live Births  0 0 0 0    Past Medical History:  Diagnosis Date   ALLERGIC RHINITIS 03/02/2007   BREAST CYST, LEFT 11/23/2009   CAD S/P percutaneous coronary angioplasty 01/2011   a) Exertional Back Pain & High RISK ST) - 95% Ostial LAD --> PCI with Promus DES 3.0 mm x 12 mm; b) LHC 08/2012: LAD stent Patent , 50% mid LAD; c) Neg Myoview  03/2012 d). LHC 12/09/2013 40-50% mid-LAD no progression, medical therapy. e) Neg nuc 2016, 2018.   Colitis 06/06/2020   microscopic lymphocytic colitis   HLD (hyperlipidemia)    Incomplete left bundle branch block    REACTIVE AIRWAY DISEASE 10/21/2008   Past Surgical History:  Procedure Laterality Date   AUGMENTATION MAMMAPLASTY Bilateral 1990   BREAST BIOPSY     BREAST SURGERY     BILAT IMPLANTS   LEFT HEART CATHETERIZATION WITH CORONARY ANGIOGRAM N/A 09/10/2012   Procedure: LEFT HEART CATHETERIZATION WITH CORONARY ANGIOGRAM;  Surgeon: Ozell Fell, MD;  Location: [facility] CATH LAB;   Service: Cardiovascular;  Laterality: N/A;   LEFT HEART CATHETERIZATION WITH CORONARY ANGIOGRAM N/A 12/09/2013   Procedure: LEFT HEART CATHETERIZATION WITH CORONARY ANGIOGRAM;  Surgeon: Alm LELON Clay, MD;  Location: [facility] CATH LAB;  Service: Cardiovascular;  Laterality: N/A;   PERCUTANEOUS CORONARY STENT INTERVENTION (PCI-S)  01/2011   Promus Premier DES 3.0 mm x12 mm Ostial LAD (extends into LM with partial jailing of Cx)   Current Outpatient Medications on File Prior to Visit  Medication Sig Dispense Refill   aspirin  EC 81 MG tablet Take 81 mg by mouth every evening.     Coenzyme Q10 (CO Q 10 PO) Take by mouth.     dicyclomine (BENTYL) 10 MG capsule Take 10 mg by mouth 2 (two) times daily.     ergocalciferol  (VITAMIN D2) 1.25 MG (50000 UT) capsule TAKE 1 CAPSULE ONCE WEEKLY     Evolocumab  (REPATHA  SURECLICK) 140 MG/ML SOAJ Inject 140 mg into the skin every 14 (fourteen) days. 6 mL 1   ezetimibe  (ZETIA ) 10 MG tablet Take 1 tablet (10 mg total) by mouth daily. 90 tablet 3   ibuprofen (ADVIL,MOTRIN) 200 MG tablet Take 200-400 mg by mouth every 6 (six) hours as needed for pain or headache.     Multiple Vitamins-Minerals (MULTI-VITAMIN GUMMIES PO) Take by mouth.     nitroGLYCERIN  (NITROSTAT ) 0.4 MG SL tablet Place 1 tablet (0.4 mg total) under the tongue every 5 (five) minutes  as needed for chest pain. 25 tablet 3   pravastatin (PRAVACHOL) 40 MG tablet Take by mouth.     sertraline  (ZOLOFT ) 100 MG tablet Take 100 mg by mouth daily.     sertraline  (ZOLOFT ) 50 MG tablet Take 1 tablet (50 mg total) by mouth daily. 30 tablet 1   No current facility-administered medications on file prior to visit.   Social History   Socioeconomic History   Marital status: Married    Spouse name: Not on file   Number of children: Not on file   Years of education: Not on file   Highest education level: Not on file  Occupational History    Employer: [employer]    Comment: Industrial/product designer  Tobacco  Use   Smoking status: Never    Passive exposure: Never   Smokeless tobacco: Never  Vaping Use   Vaping status: Never Used  Substance and Sexual Activity   Alcohol use: Not Currently   Drug use: No   Sexual activity: Not Currently    Partners: Male    Birth control/protection: Post-menopausal    Comment: first intercourse >16, 5 Partners  Other Topics Concern   Not on file  Social History Narrative   Occupation:    Never Smoked   Alcohol use- no   Married   Drug use- no   Regular Exercise- yes    Social Drivers of Corporate Investment Banker Strain: Low Risk  (08/14/2023)   Received from [facility]   Overall Financial Resource Strain (CARDIA)    Difficulty of Paying Living Expenses: Not hard at all  Food Insecurity: No Food Insecurity (08/14/2023)   Received from [facility]   Hunger Vital Sign    Within the past 12 months, you worried that your food would run out before you got the money to buy more.: Never true    Within the past 12 months, the food you bought just didn't last and you didn't have money to get more.: Never true  Transportation Needs: No Transportation Needs (08/14/2023)   Received from [facility] - Transportation    Lack of Transportation (Medical): No    Lack of Transportation (Non-Medical): No  Physical Activity: Insufficiently Active (08/14/2023)   Received from [facility]   Exercise Vital Sign    On average, how many days per week do you engage in moderate to strenuous exercise (like a brisk walk)?: 2 days    On average, how many minutes do you engage in exercise at this level?: 30 min  Stress: Stress Concern Present (08/14/2023)   Received from [facility] of [facility]ational Health - Occupational Stress Questionnaire    Feeling of Stress : Rather much  Social Connections: Somewhat Isolated (08/14/2023)   Received from [facility]   Social Network    How would you rate your social network (family, work,  friends)?: Restricted participation with some degree of social isolation  Intimate Partner Violence: Unknown (08/14/2023)   Received from [facility]   HITS    Over the last 12 months how often did your partner physically hurt you?: Never    Insult or Talk Down To: Not on file    Over the last 12 months how often did your partner threaten you with physical harm?: Never    Scream or Curse: Not on file   Family History  Problem Relation Age of Onset   Alcohol abuse Other    Diabetes Other  Hypertension Other    Heart disease Other    Allergies  Allergen Reactions   Prednisone Palpitations and Other (See Comments)   Crestor  [Rosuvastatin ] Other (See Comments)    Elevated LFTSs      PE There were no vitals filed for this visit. There is no height or weight on file to calculate BMI.  Physical Exam    Assessment and Plan:        There are no diagnoses linked to this encounter. Clotilda FORBES Pa, CMA

## 2024-03-11 ENCOUNTER — Encounter: Admitting: Obstetrics and Gynecology

## 2024-06-04 ENCOUNTER — Ambulatory Visit (HOSPITAL_COMMUNITY)
Admission: RE | Admit: 2024-06-04 | Discharge: 2024-06-04 | Disposition: A | Discharge status: Home / Self Care | Payer: Self-pay | Source: Ambulatory Visit | Attending: Family Medicine | Admitting: Family Medicine

## 2024-06-04 ENCOUNTER — Encounter (HOSPITAL_COMMUNITY): Payer: Self-pay

## 2024-06-04 VITALS — BP 137/94 | HR 91 | Temp 97.9°F | Resp 18

## 2024-06-04 DIAGNOSIS — M542 Cervicalgia: Secondary | ICD-10-CM | POA: Diagnosis not present

## 2024-06-04 DIAGNOSIS — S0990XA Unspecified injury of head, initial encounter: Secondary | ICD-10-CM | POA: Diagnosis not present

## 2024-06-04 NOTE — Discharge Instructions (Addendum)
 I recommend further evaluation and management in the ER for your symptoms

## 2024-06-04 NOTE — ED Notes (Signed)
 Patient is being discharged from the Urgent Care and sent to the Emergency Department via personal opperated vehicle . Per Harlene Code NP, patient is in need of higher level of care due to head injury with nausea and dizziness. Patient is aware and verbalizes understanding of plan of care.  Vitals:   06/04/24 1519  BP: (!) 137/94  Pulse: 91  Resp: 18  Temp: 97.9 F (36.6 C)  SpO2: 98%
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
## 2024-08-03 ENCOUNTER — Encounter: Admitting: Obstetrics and Gynecology
